# Patient Record
Sex: Male | Born: 1945 | Race: White | Hispanic: No | Smoking: Never smoker
Health system: Southern US, Community
[De-identification: ages and names within clinical notes are randomized; demographics above are authoritative.]

## PROBLEM LIST (undated history)

## (undated) DIAGNOSIS — Z87898 Personal history of other specified conditions: Secondary | ICD-10-CM

## (undated) DIAGNOSIS — M109 Gout, unspecified: Secondary | ICD-10-CM

## (undated) DIAGNOSIS — Z951 Presence of aortocoronary bypass graft: Secondary | ICD-10-CM

## (undated) DIAGNOSIS — K219 Gastro-esophageal reflux disease without esophagitis: Secondary | ICD-10-CM

## (undated) DIAGNOSIS — M549 Dorsalgia, unspecified: Secondary | ICD-10-CM

## (undated) DIAGNOSIS — N281 Cyst of kidney, acquired: Secondary | ICD-10-CM

## (undated) DIAGNOSIS — G8929 Other chronic pain: Secondary | ICD-10-CM

## (undated) DIAGNOSIS — J302 Other seasonal allergic rhinitis: Secondary | ICD-10-CM

## (undated) DIAGNOSIS — N401 Enlarged prostate with lower urinary tract symptoms: Secondary | ICD-10-CM

## (undated) DIAGNOSIS — Z87442 Personal history of urinary calculi: Secondary | ICD-10-CM

## (undated) DIAGNOSIS — I10 Essential (primary) hypertension: Secondary | ICD-10-CM

## (undated) DIAGNOSIS — I447 Left bundle-branch block, unspecified: Secondary | ICD-10-CM

## (undated) DIAGNOSIS — K5790 Diverticulosis of intestine, part unspecified, without perforation or abscess without bleeding: Secondary | ICD-10-CM

## (undated) DIAGNOSIS — R351 Nocturia: Secondary | ICD-10-CM

## (undated) DIAGNOSIS — N183 Chronic kidney disease, stage 3 unspecified: Secondary | ICD-10-CM

## (undated) DIAGNOSIS — T8859XA Other complications of anesthesia, initial encounter: Secondary | ICD-10-CM

## (undated) DIAGNOSIS — R252 Cramp and spasm: Secondary | ICD-10-CM

## (undated) DIAGNOSIS — T4145XA Adverse effect of unspecified anesthetic, initial encounter: Secondary | ICD-10-CM

## (undated) DIAGNOSIS — N200 Calculus of kidney: Secondary | ICD-10-CM

## (undated) DIAGNOSIS — E785 Hyperlipidemia, unspecified: Secondary | ICD-10-CM

## (undated) DIAGNOSIS — I2510 Atherosclerotic heart disease of native coronary artery without angina pectoris: Secondary | ICD-10-CM

## (undated) DIAGNOSIS — R82994 Hypercalciuria: Secondary | ICD-10-CM

## (undated) HISTORY — PX: TONSILLECTOMY: SUR1361

## (undated) HISTORY — PX: ESOPHAGOGASTRODUODENOSCOPY (EGD) WITH ESOPHAGEAL DILATION: SHX5812

## (undated) HISTORY — PX: CARDIAC CATHETERIZATION: SHX172

## (undated) HISTORY — PX: CARDIOVASCULAR STRESS TEST: SHX262

## (undated) HISTORY — PX: FOOT SURGERY: SHX648

## (undated) HISTORY — PX: EXTRACORPOREAL SHOCK WAVE LITHOTRIPSY: SHX1557

## (undated) HISTORY — DX: Atherosclerotic heart disease of native coronary artery without angina pectoris: I25.10

## (undated) HISTORY — PX: COLONOSCOPY: SHX174

---

## 1982-01-13 HISTORY — PX: PYELOLITHOTOMY: SHX2278

## 1994-03-16 HISTORY — PX: URETEROLITHOTOMY: SHX71

## 2002-06-29 ENCOUNTER — Ambulatory Visit (HOSPITAL_BASED_OUTPATIENT_CLINIC_OR_DEPARTMENT_OTHER): Admission: RE | Admit: 2002-06-29 | Discharge: 2002-06-29 | Payer: Self-pay | Admitting: Internal Medicine

## 2010-04-04 ENCOUNTER — Encounter: Admission: RE | Admit: 2010-04-04 | Discharge: 2010-04-04 | Payer: Self-pay | Admitting: Family Medicine

## 2011-09-04 ENCOUNTER — Other Ambulatory Visit: Payer: Self-pay | Admitting: Internal Medicine

## 2011-09-04 DIAGNOSIS — M5416 Radiculopathy, lumbar region: Secondary | ICD-10-CM

## 2011-09-05 ENCOUNTER — Ambulatory Visit
Admission: RE | Admit: 2011-09-05 | Discharge: 2011-09-05 | Disposition: A | Discharge status: Home / Self Care | Payer: Medicare Other | Source: Ambulatory Visit | Attending: Internal Medicine | Admitting: Internal Medicine

## 2011-09-05 DIAGNOSIS — M5416 Radiculopathy, lumbar region: Secondary | ICD-10-CM

## 2012-02-18 ENCOUNTER — Other Ambulatory Visit: Payer: Self-pay | Admitting: Neurological Surgery

## 2012-02-21 ENCOUNTER — Encounter (HOSPITAL_COMMUNITY): Payer: Self-pay | Admitting: Pharmacy Technician

## 2012-02-29 ENCOUNTER — Encounter (HOSPITAL_COMMUNITY)
Admission: RE | Admit: 2012-02-29 | Discharge: 2012-02-29 | Disposition: A | Discharge status: Home / Self Care | Payer: Medicare Other | Source: Ambulatory Visit | Attending: Neurological Surgery | Admitting: Neurological Surgery

## 2012-02-29 ENCOUNTER — Encounter (HOSPITAL_COMMUNITY): Payer: Self-pay

## 2012-02-29 HISTORY — DX: Dorsalgia, unspecified: M54.9

## 2012-02-29 HISTORY — DX: Gastro-esophageal reflux disease without esophagitis: K21.9

## 2012-02-29 HISTORY — DX: Essential (primary) hypertension: I10

## 2012-02-29 HISTORY — DX: Cramp and spasm: R25.2

## 2012-02-29 HISTORY — DX: Hyperlipidemia, unspecified: E78.5

## 2012-02-29 HISTORY — DX: Personal history of urinary calculi: Z87.442

## 2012-02-29 HISTORY — DX: Diverticulosis of intestine, part unspecified, without perforation or abscess without bleeding: K57.90

## 2012-02-29 HISTORY — DX: Other chronic pain: G89.29

## 2012-02-29 LAB — BASIC METABOLIC PANEL
BUN: 21 mg/dL (ref 6–23)
Chloride: 104 mEq/L (ref 96–112)
Creatinine, Ser: 1.49 mg/dL — ABNORMAL HIGH (ref 0.50–1.35)
GFR calc Af Amer: 55 mL/min — ABNORMAL LOW (ref >90)
Glucose, Bld: 122 mg/dL — ABNORMAL HIGH (ref 70–99)
Potassium: 3.9 mEq/L (ref 3.5–5.1)

## 2012-02-29 LAB — SURGICAL PCR SCREEN
MRSA, PCR: NEGATIVE
Staphylococcus aureus: POSITIVE — AB

## 2012-02-29 LAB — CBC
HCT: 45.3 % (ref 39.0–52.0)
Hemoglobin: 15.4 g/dL (ref 13.0–17.0)
MCH: 30 pg (ref 26.0–34.0)
MCHC: 34 g/dL (ref 30.0–36.0)
RDW: 13.9 % (ref 11.5–15.5)

## 2012-02-29 LAB — TYPE AND SCREEN: Antibody Screen: NEGATIVE

## 2012-02-29 LAB — PROTIME-INR: INR: 0.97 (ref 0.00–1.49)

## 2012-02-29 LAB — ABO/RH: ABO/RH(D): A NEG

## 2012-02-29 NOTE — Progress Notes (Signed)
Notified Erie Noe of needing orders to be signed by Dr.Jones.He will return on Mon

## 2012-02-29 NOTE — Pre-Procedure Instructions (Signed)
20 SULEYMAN EHRMAN  02/29/2012   Your procedure is scheduled on:  Fri, Aug 23 @ 7:30 AM  Report to [facility] at 5:30 AM.  Call this number if you have problems the morning of surgery: [phone]   Remember:   Do not eat food:After Midnight.    Take these medicines the morning of surgery with A SIP OF WATER: Allopurinol(Zyloprim),Amlodipine(Norvasc),Loratadine(Claritin),Omeprazole(Prilosec),and Micardis(Telmisartan)   Do not wear jewelry  Do not wear lotions, powders, or colognes  Men may shave face and neck.  Do not bring valuables to the hospital.  Contacts, dentures or bridgework may not be worn into surgery.  Leave suitcase in the car. After surgery it may be brought to your room.  For patients admitted to the hospital, checkout time is 11:00 AM the day of discharge.   Patients discharged the day of surgery will not be allowed to drive home.    Special Instructions: CHG Shower Use Special Wash: 1/2 bottle night before surgery and 1/2 bottle morning of surgery.   Please read over the following fact sheets that you were given: Pain Booklet, Coughing and Deep Breathing, Blood Transfusion Information, MRSA Information and Surgical Site Infection Prevention

## 2012-02-29 NOTE — Progress Notes (Signed)
Echo/Heart cath/Stress test done in 1996 and saw a cardiologist only bc he was in the [facility] and was over 41 and it was required  Medical MD is Dr.Neville Kevan Ny with [facility]  Denies recent cxr but an ekg was done in Jan 2013-to request from [facility]

## 2012-03-07 ENCOUNTER — Encounter (HOSPITAL_COMMUNITY): Payer: Self-pay | Admitting: Anesthesiology

## 2012-03-07 ENCOUNTER — Encounter (HOSPITAL_COMMUNITY)
Admission: RE | Disposition: A | Discharge status: Home / Self Care | Payer: Self-pay | Source: Ambulatory Visit | Attending: Neurological Surgery

## 2012-03-07 ENCOUNTER — Inpatient Hospital Stay (HOSPITAL_COMMUNITY): Payer: Medicare Other

## 2012-03-07 ENCOUNTER — Inpatient Hospital Stay (HOSPITAL_COMMUNITY): Payer: Medicare Other | Admitting: Anesthesiology

## 2012-03-07 ENCOUNTER — Inpatient Hospital Stay (HOSPITAL_COMMUNITY)
Admission: RE | Admit: 2012-03-07 | Discharge: 2012-03-09 | DRG: 460 | Disposition: A | Discharge status: Home / Self Care | Payer: Medicare Other | Source: Ambulatory Visit | Attending: Neurological Surgery | Admitting: Neurological Surgery

## 2012-03-07 ENCOUNTER — Encounter (HOSPITAL_COMMUNITY): Payer: Self-pay | Admitting: *Deleted

## 2012-03-07 ENCOUNTER — Encounter (HOSPITAL_COMMUNITY): Payer: Self-pay | Admitting: Neurological Surgery

## 2012-03-07 DIAGNOSIS — N289 Disorder of kidney and ureter, unspecified: Secondary | ICD-10-CM | POA: Diagnosis present

## 2012-03-07 DIAGNOSIS — M4310 Spondylolisthesis, site unspecified: Principal | ICD-10-CM | POA: Diagnosis present

## 2012-03-07 DIAGNOSIS — K219 Gastro-esophageal reflux disease without esophagitis: Secondary | ICD-10-CM | POA: Diagnosis present

## 2012-03-07 DIAGNOSIS — I10 Essential (primary) hypertension: Secondary | ICD-10-CM | POA: Diagnosis present

## 2012-03-07 HISTORY — PX: POSTERIOR LUMBAR FUSION: SHX6036

## 2012-03-07 SURGERY — POSTERIOR LUMBAR FUSION 1 LEVEL
Anesthesia: General | Site: Back | Laterality: Bilateral | Wound class: Clean

## 2012-03-07 MED ORDER — SODIUM CHLORIDE 0.9 % IV SOLN
10.0000 mg | INTRAVENOUS | Status: DC | PRN
  Administered 2012-03-07: 25 ug/min via INTRAVENOUS

## 2012-03-07 MED ORDER — BACITRACIN 50000 UNITS IM SOLR
INTRAMUSCULAR | Status: AC
  Filled 2012-03-07: qty 1

## 2012-03-07 MED ORDER — PHENOL 1.4 % MT LIQD: 1.0000 | OROMUCOSAL | Status: DC | PRN

## 2012-03-07 MED ORDER — DROPERIDOL 2.5 MG/ML IJ SOLN: 0.6250 mg | INTRAMUSCULAR | Status: DC | PRN

## 2012-03-07 MED ORDER — ALLOPURINOL 100 MG PO TABS
100.0000 mg | ORAL_TABLET | Freq: Every day | ORAL | Status: DC
  Administered 2012-03-08 – 2012-03-09 (×2): 100 mg via ORAL
  Filled 2012-03-07 (×2): qty 1

## 2012-03-07 MED ORDER — CEFAZOLIN SODIUM 1-5 GM-% IV SOLN
1.0000 g | Freq: Three times a day (TID) | INTRAVENOUS | Status: AC
  Administered 2012-03-07 (×2): 1 g via INTRAVENOUS
  Filled 2012-03-07 (×2): qty 50

## 2012-03-07 MED ORDER — MIDAZOLAM HCL 5 MG/5ML IJ SOLN
INTRAMUSCULAR | Status: DC | PRN
  Administered 2012-03-07: 2 mg via INTRAVENOUS

## 2012-03-07 MED ORDER — ACETAMINOPHEN 325 MG PO TABS
650.0000 mg | ORAL_TABLET | ORAL | Status: DC | PRN
  Administered 2012-03-08: 650 mg via ORAL
  Filled 2012-03-07: qty 2

## 2012-03-07 MED ORDER — DEXAMETHASONE 4 MG PO TABS
4.0000 mg | ORAL_TABLET | Freq: Four times a day (QID) | ORAL | Status: DC
  Administered 2012-03-07 – 2012-03-09 (×9): 4 mg via ORAL
  Filled 2012-03-07 (×12): qty 1

## 2012-03-07 MED ORDER — THROMBIN 20000 UNITS EX SOLR
CUTANEOUS | Status: DC | PRN
  Administered 2012-03-07: 08:00:00 via TOPICAL

## 2012-03-07 MED ORDER — 0.9 % SODIUM CHLORIDE (POUR BTL) OPTIME
TOPICAL | Status: DC | PRN
  Administered 2012-03-07: 1000 mL

## 2012-03-07 MED ORDER — HYDROMORPHONE HCL PF 1 MG/ML IJ SOLN
INTRAMUSCULAR | Status: AC
  Filled 2012-03-07: qty 1

## 2012-03-07 MED ORDER — MENTHOL 3 MG MT LOZG: 1.0000 | LOZENGE | OROMUCOSAL | Status: DC | PRN

## 2012-03-07 MED ORDER — ZOLPIDEM TARTRATE 5 MG PO TABS: 5.0000 mg | ORAL_TABLET | Freq: Every evening | ORAL | Status: DC | PRN

## 2012-03-07 MED ORDER — ACETAMINOPHEN 650 MG RE SUPP: 650.0000 mg | RECTAL | Status: DC | PRN

## 2012-03-07 MED ORDER — ACETAMINOPHEN 10 MG/ML IV SOLN
1000.0000 mg | Freq: Four times a day (QID) | INTRAVENOUS | Status: AC
  Administered 2012-03-07 – 2012-03-08 (×3): 1000 mg via INTRAVENOUS
  Filled 2012-03-07 (×5): qty 100

## 2012-03-07 MED ORDER — ONDANSETRON HCL 4 MG/2ML IJ SOLN
INTRAMUSCULAR | Status: DC | PRN
  Administered 2012-03-07: 4 mg via INTRAVENOUS

## 2012-03-07 MED ORDER — LIDOCAINE HCL 4 % MT SOLN
OROMUCOSAL | Status: DC | PRN
  Administered 2012-03-07: 4 mL via TOPICAL

## 2012-03-07 MED ORDER — ONDANSETRON HCL 4 MG/2ML IJ SOLN: 4.0000 mg | INTRAMUSCULAR | Status: DC | PRN

## 2012-03-07 MED ORDER — ATORVASTATIN CALCIUM 10 MG PO TABS
10.0000 mg | ORAL_TABLET | Freq: Every day | ORAL | Status: DC
  Administered 2012-03-07 – 2012-03-08 (×2): 10 mg via ORAL
  Filled 2012-03-07 (×3): qty 1

## 2012-03-07 MED ORDER — SUCCINYLCHOLINE CHLORIDE 20 MG/ML IJ SOLN
INTRAMUSCULAR | Status: DC | PRN
  Administered 2012-03-07: 100 mg via INTRAVENOUS

## 2012-03-07 MED ORDER — LORATADINE 10 MG PO TABS
10.0000 mg | ORAL_TABLET | Freq: Every day | ORAL | Status: DC
  Administered 2012-03-08 – 2012-03-09 (×2): 10 mg via ORAL
  Filled 2012-03-07 (×3): qty 1

## 2012-03-07 MED ORDER — SODIUM CHLORIDE 0.9 % IV SOLN: 250.0000 mL | INTRAVENOUS | Status: DC

## 2012-03-07 MED ORDER — CELECOXIB 200 MG PO CAPS
200.0000 mg | ORAL_CAPSULE | Freq: Two times a day (BID) | ORAL | Status: DC
  Administered 2012-03-07 – 2012-03-09 (×5): 200 mg via ORAL
  Filled 2012-03-07 (×7): qty 1

## 2012-03-07 MED ORDER — PROPOFOL 10 MG/ML IV EMUL
INTRAVENOUS | Status: DC | PRN
  Administered 2012-03-07: 20 mg via INTRAVENOUS
  Administered 2012-03-07: 170 mg via INTRAVENOUS

## 2012-03-07 MED ORDER — AMLODIPINE BESYLATE 5 MG PO TABS
5.0000 mg | ORAL_TABLET | Freq: Every day | ORAL | Status: DC
  Administered 2012-03-08: 5 mg via ORAL
  Filled 2012-03-07 (×4): qty 1

## 2012-03-07 MED ORDER — EPHEDRINE SULFATE 50 MG/ML IJ SOLN
INTRAMUSCULAR | Status: DC | PRN
  Administered 2012-03-07: 10 mg via INTRAVENOUS

## 2012-03-07 MED ORDER — OXYCODONE HCL 5 MG/5ML PO SOLN: 5.0000 mg | Freq: Once | ORAL | Status: DC | PRN

## 2012-03-07 MED ORDER — FENTANYL CITRATE 0.05 MG/ML IJ SOLN
INTRAMUSCULAR | Status: DC | PRN
  Administered 2012-03-07: 150 ug via INTRAVENOUS
  Administered 2012-03-07: 100 ug via INTRAVENOUS

## 2012-03-07 MED ORDER — SODIUM CHLORIDE 0.9 % IJ SOLN
3.0000 mL | Freq: Two times a day (BID) | INTRAMUSCULAR | Status: DC
  Administered 2012-03-08 – 2012-03-09 (×2): 3 mL via INTRAVENOUS

## 2012-03-07 MED ORDER — SODIUM CHLORIDE 0.9 % IR SOLN
Status: DC | PRN
  Administered 2012-03-07: 08:00:00

## 2012-03-07 MED ORDER — OXYCODONE HCL 5 MG PO TABS: 5.0000 mg | ORAL_TABLET | Freq: Once | ORAL | Status: DC | PRN

## 2012-03-07 MED ORDER — HYDROMORPHONE HCL PF 1 MG/ML IJ SOLN
0.2500 mg | INTRAMUSCULAR | Status: DC | PRN
  Administered 2012-03-07 (×2): 0.5 mg via INTRAVENOUS

## 2012-03-07 MED ORDER — IRBESARTAN 75 MG PO TABS
75.0000 mg | ORAL_TABLET | Freq: Every day | ORAL | Status: DC
  Administered 2012-03-08 – 2012-03-09 (×2): 75 mg via ORAL
  Filled 2012-03-07 (×2): qty 1

## 2012-03-07 MED ORDER — ACETAMINOPHEN 10 MG/ML IV SOLN
INTRAVENOUS | Status: AC
  Administered 2012-03-07: 1000 mg via INTRAVENOUS
  Filled 2012-03-07: qty 100

## 2012-03-07 MED ORDER — CYCLOBENZAPRINE HCL 10 MG PO TABS
10.0000 mg | ORAL_TABLET | Freq: Three times a day (TID) | ORAL | Status: DC | PRN
  Administered 2012-03-09: 10 mg via ORAL
  Filled 2012-03-07: qty 1

## 2012-03-07 MED ORDER — PHENYLEPHRINE HCL 10 MG/ML IJ SOLN
INTRAMUSCULAR | Status: DC | PRN
  Administered 2012-03-07: 40 ug via INTRAVENOUS
  Administered 2012-03-07 (×4): 80 ug via INTRAVENOUS
  Administered 2012-03-07: 40 ug via INTRAVENOUS

## 2012-03-07 MED ORDER — CEFAZOLIN SODIUM-DEXTROSE 2-3 GM-% IV SOLR
INTRAVENOUS | Status: DC | PRN
  Administered 2012-03-07: 2 g via INTRAVENOUS

## 2012-03-07 MED ORDER — SENNA 8.6 MG PO TABS
1.0000 | ORAL_TABLET | Freq: Two times a day (BID) | ORAL | Status: DC
  Administered 2012-03-07 – 2012-03-09 (×4): 8.6 mg via ORAL
  Filled 2012-03-07 (×5): qty 1

## 2012-03-07 MED ORDER — ALBUMIN HUMAN 5 % IV SOLN
INTRAVENOUS | Status: DC | PRN
  Administered 2012-03-07: 08:00:00 via INTRAVENOUS

## 2012-03-07 MED ORDER — POTASSIUM CHLORIDE IN NACL 20-0.9 MEQ/L-% IV SOLN
INTRAVENOUS | Status: DC
  Administered 2012-03-07 – 2012-03-08 (×2): via INTRAVENOUS
  Filled 2012-03-07 (×6): qty 1000

## 2012-03-07 MED ORDER — ASPIRIN EC 81 MG PO TBEC
81.0000 mg | DELAYED_RELEASE_TABLET | Freq: Every day | ORAL | Status: DC
  Administered 2012-03-07 – 2012-03-09 (×3): 81 mg via ORAL
  Filled 2012-03-07 (×3): qty 1

## 2012-03-07 MED ORDER — LACTATED RINGERS IV SOLN
INTRAVENOUS | Status: DC | PRN
  Administered 2012-03-07 (×2): via INTRAVENOUS

## 2012-03-07 MED ORDER — LIDOCAINE HCL (CARDIAC) 20 MG/ML IV SOLN
INTRAVENOUS | Status: DC | PRN
  Administered 2012-03-07: 60 mg via INTRAVENOUS

## 2012-03-07 MED ORDER — CEFAZOLIN SODIUM-DEXTROSE 2-3 GM-% IV SOLR
INTRAVENOUS | Status: AC
  Filled 2012-03-07: qty 50

## 2012-03-07 MED ORDER — OXYCODONE-ACETAMINOPHEN 5-325 MG PO TABS: 1.0000 | ORAL_TABLET | ORAL | Status: DC | PRN

## 2012-03-07 MED ORDER — SODIUM CHLORIDE 0.9 % IJ SOLN: 3.0000 mL | INTRAMUSCULAR | Status: DC | PRN

## 2012-03-07 MED ORDER — DEXAMETHASONE SODIUM PHOSPHATE 4 MG/ML IJ SOLN
4.0000 mg | Freq: Four times a day (QID) | INTRAMUSCULAR | Status: DC
  Filled 2012-03-07 (×8): qty 1

## 2012-03-07 MED ORDER — SODIUM CHLORIDE 0.9 % IV SOLN
INTRAVENOUS | Status: AC
  Filled 2012-03-07: qty 500

## 2012-03-07 MED ORDER — POTASSIUM CHLORIDE CRYS ER 20 MEQ PO TBCR
20.0000 meq | EXTENDED_RELEASE_TABLET | Freq: Every day | ORAL | Status: DC
  Administered 2012-03-07 – 2012-03-09 (×3): 20 meq via ORAL
  Filled 2012-03-07 (×3): qty 1

## 2012-03-07 MED ORDER — PANTOPRAZOLE SODIUM 40 MG PO TBEC
40.0000 mg | DELAYED_RELEASE_TABLET | Freq: Every day | ORAL | Status: DC
  Administered 2012-03-08 – 2012-03-09 (×2): 40 mg via ORAL

## 2012-03-07 MED ORDER — BUPIVACAINE HCL (PF) 0.25 % IJ SOLN
INTRAMUSCULAR | Status: DC | PRN
  Administered 2012-03-07: 5 mL

## 2012-03-07 MED ORDER — MORPHINE SULFATE 2 MG/ML IJ SOLN
1.0000 mg | INTRAMUSCULAR | Status: DC | PRN
  Administered 2012-03-07 – 2012-03-08 (×3): 2 mg via INTRAVENOUS
  Filled 2012-03-07 (×3): qty 1

## 2012-03-07 SURGICAL SUPPLY — 63 items
11x28x9mm CoRoent MP ×2 IMPLANT
5.5 Tulip Screw ×8 IMPLANT
5.5x40mm Rod ×4 IMPLANT
BAG DECANTER FOR FLEXI CONT (MISCELLANEOUS) ×2 IMPLANT
BENZOIN TINCTURE PRP APPL 2/3 (GAUZE/BANDAGES/DRESSINGS) ×2 IMPLANT
BLADE SURG ROTATE 9660 (MISCELLANEOUS) IMPLANT
BONE MATRIX OSTEOCEL PLUS 5CC (Bone Implant) ×2 IMPLANT
BUR MATCHSTICK NEURO 3.0 LAGG (BURR) ×2 IMPLANT
CANISTER SUCTION 2500CC (MISCELLANEOUS) ×2 IMPLANT
CLIP NEUROVISION LG (CLIP) ×2 IMPLANT
CLOTH BEACON ORANGE TIMEOUT ST (SAFETY) ×2 IMPLANT
CONT SPEC 4OZ CLIKSEAL STRL BL (MISCELLANEOUS) ×4 IMPLANT
COVER BACK TABLE 24X17X13 BIG (DRAPES) IMPLANT
COVER TABLE BACK 60X90 (DRAPES) ×2 IMPLANT
DERMABOND ADVANCED (GAUZE/BANDAGES/DRESSINGS) ×1
DERMABOND ADVANCED .7 DNX12 (GAUZE/BANDAGES/DRESSINGS) ×1 IMPLANT
DRAPE C-ARM 42X72 X-RAY (DRAPES) ×4 IMPLANT
DRAPE C-ARMOR (DRAPES) ×2 IMPLANT
DRAPE LAPAROTOMY 100X72X124 (DRAPES) ×2 IMPLANT
DRAPE POUCH INSTRU U-SHP 10X18 (DRAPES) ×2 IMPLANT
DRAPE SURG 17X23 STRL (DRAPES) ×2 IMPLANT
DRESSING TELFA 8X3 (GAUZE/BANDAGES/DRESSINGS) ×2 IMPLANT
DRSG OPSITE 4X5.5 SM (GAUZE/BANDAGES/DRESSINGS) ×2 IMPLANT
DURAPREP 26ML APPLICATOR (WOUND CARE) ×2 IMPLANT
ELECT REM PT RETURN 9FT ADLT (ELECTROSURGICAL) ×2
ELECTRODE REM PT RTRN 9FT ADLT (ELECTROSURGICAL) ×1 IMPLANT
EVACUATOR 1/8 PVC DRAIN (DRAIN) ×2 IMPLANT
GAUZE SPONGE 4X4 16PLY XRAY LF (GAUZE/BANDAGES/DRESSINGS) ×2 IMPLANT
GLOVE BIO SURGEON STRL SZ8 (GLOVE) ×4 IMPLANT
GLOVE BIOGEL PI IND STRL 7.0 (GLOVE) ×3 IMPLANT
GLOVE BIOGEL PI INDICATOR 7.0 (GLOVE) ×3
GLOVE SS BIOGEL STRL SZ 6.5 (GLOVE) ×2 IMPLANT
GLOVE SUPERSENSE BIOGEL SZ 6.5 (GLOVE) ×2
GLOVE SURG SS PI 7.0 STRL IVOR (GLOVE) ×2 IMPLANT
GOWN BRE IMP SLV AUR LG STRL (GOWN DISPOSABLE) ×2 IMPLANT
GOWN BRE IMP SLV AUR XL STRL (GOWN DISPOSABLE) ×6 IMPLANT
GOWN STRL REIN 2XL LVL4 (GOWN DISPOSABLE) IMPLANT
HEMOSTAT POWDER KIT SURGIFOAM (HEMOSTASIS) IMPLANT
KIT BASIN OR (CUSTOM PROCEDURE TRAY) ×2 IMPLANT
KIT NEEDLE NVM5 EMG ELECT (KITS) ×1 IMPLANT
KIT NEEDLE NVM5 EMG ELECTRODE (KITS) ×1
KIT ROOM TURNOVER OR (KITS) ×2 IMPLANT
LIGHT SOURCE ANGLE TIP STR 7FT (MISCELLANEOUS) ×2 IMPLANT
Lock Screw ×2 IMPLANT
MILL MEDIUM DISP (BLADE) IMPLANT
NEEDLE HYPO 25X1 1.5 SAFETY (NEEDLE) ×2 IMPLANT
NS IRRIG 1000ML POUR BTL (IV SOLUTION) ×2 IMPLANT
PACK LAMINECTOMY NEURO (CUSTOM PROCEDURE TRAY) ×2 IMPLANT
PAD ARMBOARD 7.5X6 YLW CONV (MISCELLANEOUS) ×6 IMPLANT
SCREW SHANK MASTLIS 5.5X35 (Screw) ×8 IMPLANT
SPONGE LAP 4X18 X RAY DECT (DISPOSABLE) IMPLANT
SPONGE SURGIFOAM ABS GEL 100 (HEMOSTASIS) ×2 IMPLANT
STRIP CLOSURE SKIN 1/2X4 (GAUZE/BANDAGES/DRESSINGS) ×2 IMPLANT
SUT VIC AB 0 CT1 18XCR BRD8 (SUTURE) ×1 IMPLANT
SUT VIC AB 0 CT1 8-18 (SUTURE) ×1
SUT VIC AB 2-0 CP2 18 (SUTURE) ×2 IMPLANT
SUT VIC AB 3-0 SH 8-18 (SUTURE) ×4 IMPLANT
SYR 20ML ECCENTRIC (SYRINGE) ×2 IMPLANT
TOWEL OR 17X24 6PK STRL BLUE (TOWEL DISPOSABLE) ×2 IMPLANT
TOWEL OR 17X26 10 PK STRL BLUE (TOWEL DISPOSABLE) ×2 IMPLANT
TRAP SPECIMEN MUCOUS 40CC (MISCELLANEOUS) IMPLANT
TRAY FOLEY CATH 14FRSI W/METER (CATHETERS) ×2 IMPLANT
WATER STERILE IRR 1000ML POUR (IV SOLUTION) ×2 IMPLANT

## 2012-03-07 NOTE — Progress Notes (Signed)
UR COMPLETED  

## 2012-03-07 NOTE — H&P (Signed)
Subjective: Patient is a 66 y.o. male admitted for PLIF L4-5. Onset of symptoms was several year ago, gradually worsening since that time.  The pain is rated severe, and is located at the across the lower back and radiates to RLE. The pain is described as aching and sharp and occurs all day. The symptoms have been progressive. Symptoms are exacerbated by exercise. MRI or CT showed stenosis/ spondylolisthesis L4-5   Past Medical History  Diagnosis Date  . History of kidney stones   . Hypertension     takes Norvasc and Micardis daily  . Hyperlipidemia     takes Crestor daily  . Pneumonia     history of double 30+yrs ago  . History of seasonal allergies     takes Loratadine daily  . Joint pain   . Chronic back pain     herniated disc  . GERD (gastroesophageal reflux disease)     takes Omeprazole daily  . Diverticulosis   . Nocturia   . Muscle cramps     takes KDUr and Mag Ox as a trial to see if it will help  . History of gout     takes Allopurinol daily    Past Surgical History  Procedure Date  . Cystoscopy 1993  . Foot surgery 47yrs ago    right-removed bone spur   . Colonoscopy   . Esophagogastroduodenoscopy     with diliation  . Lithotripsy     x 2  . Vasectomy 46yrs ago    Prior to Admission medications   Medication Sig Start Date End Date Taking? Authorizing Provider  allopurinol (ZYLOPRIM) 100 MG tablet Take 100 mg by mouth daily.   Yes Historical Provider, MD  amLODipine (NORVASC) 5 MG tablet Take 5 mg by mouth daily.   Yes Historical Provider, MD  aspirin EC 81 MG tablet Take 81 mg by mouth daily.   Yes Historical Provider, MD  loratadine (CLARITIN) 10 MG tablet Take 10 mg by mouth daily.   Yes Historical Provider, MD  magnesium oxide (MAG-OX) 400 MG tablet Take 400 mg by mouth daily.   Yes Historical Provider, MD  Multiple Vitamin (MULTIVITAMIN WITH MINERALS) TABS Take 1 tablet by mouth daily.   Yes Historical Provider, MD  Omega-3 Fatty Acids (FISH OIL) 1200 MG  CAPS Take 1 capsule by mouth daily.   Yes Historical Provider, MD  omeprazole (PRILOSEC) 20 MG capsule Take 20 mg by mouth daily.   Yes Historical Provider, MD  potassium chloride SA (K-DUR,KLOR-CON) 20 MEQ tablet Take 20 mEq by mouth daily.   Yes Historical Provider, MD  rosuvastatin (CRESTOR) 5 MG tablet Take 5 mg by mouth at bedtime.   Yes Historical Provider, MD  telmisartan (MICARDIS) 80 MG tablet Take 80 mg by mouth every morning.   Yes Historical Provider, MD   No Known Allergies  History  Substance Use Topics  . Smoking status: Never Smoker   . Smokeless tobacco: Not on file  . Alcohol Use: No    History reviewed. No pertinent family history.   Review of Systems  Positive ROS: neg  All other systems have been reviewed and were otherwise negative with the exception of those mentioned in the HPI and as above.  Objective: Vital signs in last 24 hours: Temp:  [97.6 F (36.4 C)] 97.6 F (36.4 C) (08/23 0540) Pulse Rate:  [97] 97  (08/23 0540) Resp:  [18] 18  (08/23 0540) BP: (123-148)/(86-101) 123/86 mmHg (08/23 0612) SpO2:  [97 %] 97 % (  08/23 0540)  General Appearance: Alert, cooperative, no distress, appears stated age Head: Normocephalic, without obvious abnormality, atraumatic Eyes: PERRL, conjunctiva/corneas clear, EOM's intact, fundi benign, both eyes      Ears: Normal TM's and external ear canals, both ears Throat: Lips, mucosa, and tongue normal; teeth and gums normal Neck: Supple, symmetrical, trachea midline, no adenopathy; thyroid: No enlargement/tenderness/nodules; no carotid bruit or JVD Back: Symmetric, no curvature, ROM normal, no CVA tenderness Lungs: Clear to auscultation bilaterally, respirations unlabored Heart: Regular rate and rhythm, S1 and S2 normal, no murmur, rub or gallop Abdomen: Soft, non-tender, bowel sounds active all four quadrants, no masses, no organomegaly Extremities: Extremities normal, atraumatic, no cyanosis or edema Pulses: 2+ and  symmetric all extremities Skin: Skin color, texture, turgor normal, no rashes or lesions  NEUROLOGIC:   Mental status: Alert and oriented x4,  no aphasia, good attention span, fund of knowledge, and memory Motor Exam - grossly normal Sensory Exam - grossly normal Reflexes: 1+ Coordination - grossly normal Gait - grossly normal Balance - grossly normal Cranial Nerves: I: smell Not tested  II: visual acuity  OS: nl    OD: nl  II: visual fields Full to confrontation  II: pupils Equal, round, reactive to light  III,VII: ptosis None  III,IV,VI: extraocular muscles  Full ROM  V: mastication Normal  V: facial light touch sensation  Normal  V,VII: corneal reflex  Present  VII: facial muscle function - upper  Normal  VII: facial muscle function - lower Normal  VIII: hearing Not tested  IX: soft palate elevation  Normal  IX,X: gag reflex Present  XI: trapezius strength  5/5  XI: sternocleidomastoid strength 5/5  XI: neck flexion strength  5/5  XII: tongue strength  Normal    Data Review Lab Results  Component Value Date   WBC 8.9 02/29/2012   HGB 15.4 02/29/2012   HCT 45.3 02/29/2012   MCV 88.1 02/29/2012   PLT 158 02/29/2012   Lab Results  Component Value Date   NA 139 02/29/2012   K 3.9 02/29/2012   CL 104 02/29/2012   CO2 24 02/29/2012   BUN 21 02/29/2012   CREATININE 1.49* 02/29/2012   GLUCOSE 122* 02/29/2012   Lab Results  Component Value Date   INR 0.97 02/29/2012    Assessment/Plan: Patient admitted for PLIF L4-5. Patient has failed conservative therapy.  I explained the condition and procedure to the patient and answered any questions.  Patient wishes to proceed with procedure as planned. Understands risks/ benefits and typical outcomes of procedure.   JONES,DAVID S 03/07/2012 7:45 AM

## 2012-03-07 NOTE — Anesthesia Preprocedure Evaluation (Signed)
Anesthesia Evaluation  Patient identified by MRN, date of birth, ID band Patient awake    Reviewed: Allergy & Precautions, H&P , NPO status , Patient's Chart, lab work & pertinent test results  Airway Mallampati: II TM Distance: >3 FB Neck ROM: Full    Dental  (+) Teeth Intact and Dental Advisory Given   Pulmonary pneumonia -, resolved,  breath sounds clear to auscultation  Pulmonary exam normal       Cardiovascular hypertension, Pt. on medications Rhythm:Regular Rate:Normal     Neuro/Psych    GI/Hepatic Neg liver ROS, GERD-  Medicated,  Endo/Other  negative endocrine ROS  Renal/GU Renal InsufficiencyRenal disease     Musculoskeletal   Abdominal   Peds  Hematology   Anesthesia Other Findings   Reproductive/Obstetrics                           Anesthesia Physical Anesthesia Plan  ASA: II  Anesthesia Plan: General   Post-op Pain Management:    Induction: Intravenous  Airway Management Planned: Oral ETT  Additional Equipment:   Intra-op Plan:   Post-operative Plan: Extubation in OR  Informed Consent: I have reviewed the patients History and Physical, chart, labs and discussed the procedure including the risks, benefits and alternatives for the proposed anesthesia with the patient or authorized representative who has indicated his/her understanding and acceptance.   Dental advisory given  Plan Discussed with: CRNA, Anesthesiologist and Surgeon  Anesthesia Plan Comments:         Anesthesia Quick Evaluation

## 2012-03-07 NOTE — Anesthesia Postprocedure Evaluation (Signed)
Anesthesia Post Note  Patient: [name] [name]  Procedure(s) Performed: Procedure(s) (LRB): POSTERIOR LUMBAR FUSION 1 LEVEL (Bilateral)  Anesthesia type: general  Patient location: PACU  Post pain: Pain level controlled  Post assessment: Patient's Cardiovascular Status Stable  Last Vitals:  Filed Vitals:   03/07/12 1240  BP:   Pulse: 81  Temp: 36.1 C  Resp: 10    Post vital signs: Reviewed and stable  Level of consciousness: sedated  Complications: No apparent anesthesia complications

## 2012-03-07 NOTE — Preoperative (Signed)
Beta Blockers   Reason not to administer Beta Blockers:Not Applicable 

## 2012-03-07 NOTE — Op Note (Signed)
03/07/2012  11:29 AM  PATIENT:  [name] [name]  66 y.o. 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 Decompressive lumbar laminectomy L4-5 requiring more work than would be required of the typical PLIF procedure in order to adequately decompress the neural elements.  2. Posterior lumbar interbody fusion L4-5 using a PEEK interbody cages packed with morcellized allograft and autograft 3. Posterior fixation L4-5 using Nuvasive screws.    SURGEON:  Marikay Alar, MD  ASSISTANTS: None  ANESTHESIA:  General  EBL: 150 ml  Total I/O In: 1750 [I.V.:1500; IV Piggyback:250] Out: 265 [Urine:115; Blood:150]  BLOOD ADMINISTERED:none  DRAINS: Hemovac   INDICATION FOR PROCEDURE: This is a patient presented with severe back and right leg pain. MRI showed a grade 1 spondylolisthesis at L4-5 with spinal stenosis. He tried medical management without relief. Recommended a PLIF at L4-5. Patient understood the risks, benefits, and alternatives and potential outcomes and wished to proceed.  PROCEDURE DETAILS:  The patient was brought to the operating room. After induction of generalized endotracheal anesthesia the patient was rolled into the prone position on chest rolls and all pressure points were padded. He was prepped for EMG monitoring was used through the entirety of the case. The patient's lumbar region was cleaned and then prepped with DuraPrep and draped in the usual sterile fashion. Anesthesia was injected and then a dorsal midline incision was made and carried down to the lumbosacral fascia. The fascia was opened and the paraspinous musculature was taken down in a subperiosteal fashion to expose L4-5. Intraoperative fluoroscopy confirmed my level, and then the MASPLIF retractor was placed to expose L4-5. Utilizing AP and lateral fluoroscopy the entry point for the cortical screw at L4 was  identified 3 mm medial to the lateral edge of the pars. I then drilled and tapped in an upward and outward direction utilizing AP and lateral fluoroscopy and then placed a 35 mm cortical pedicle screw bilaterally at L4. Then turned my attention to the decompression and the spinous process was removed and complete lumbar laminectomies, hemi- facetectomies, and foraminotomies were performed at L4-5. The yellow ligament was removed to expose the underlying dura and nerve roots, and generous foraminotomies were performed to adequately decompress the neural elements that is his spinal stenosis. This required more decompressive work than would be typical of a simple PLIF exposure. Once the decompression was complete, I turned my attention to the posterior lower lumbar interbody fusion. The epidural venous vasculature was coagulated and cut sharply. Disc space was incised and the initial discectomy was performed with pituitary rongeurs. The disc space was distracted with sequential distractors to a height of 11 mm. We then used a series of scrapers and shavers to prepare the endplates for fusion. The midline was prepared with Epstein curettes. Once the complete discectomy was finished, we packed an appropriate sized peek interbody cage with local autograft and morcellized allograft, gently retracted the nerve root, and tapped the cage into position at L4-5 on the right. We also tapped a cage into the opposite side. The midline was packed with morselized autograft and allograft. We then turned our attention to the posterior fixation at L5. The pedicle screw entry zones were identified utilizing surface landmarks and AP and lateral fluoroscopy. Drilled in a lateral projection along the endplates of L5 and tapped each pedicle with the appropriate tap. We palpated with a ball probe to assure no  break in the cortex. We then placed 35 mm cortical screw  into the pedicles bilaterally at  L5 .  We then placed lordotic rods into  the multiaxial screw heads of the pedicle screws and locked these in position with the locking caps and anti-torque device. We then checked our construct with AP and lateral fluoroscopy. Irrigated with copious amounts of bacitracin-containing saline solution. Placed a medium Hemovac drain through separate stab incision. Inspected the nerve roots once again to assure adequate decompression, lined to the dura with Gelfoam, and closed the muscle and the fascia with 0 Vicryl. Closed the subcutaneous tissues with 2-0 Vicryl and subcuticular tissues with 3-0 Vicryl. The skin was closed with benzoin and Steri-Strips. Dressing was then applied, the patient was awakened from general anesthesia and transported to the recovery room in stable condition. At the end of the procedure all sponge, needle and instrument counts were correct.   PLAN OF CARE: Admit to inpatient   PATIENT DISPOSITION:  PACU - hemodynamically stable.   Delay start of Pharmacological VTE agent (>24hrs) due to surgical blood loss or risk of bleeding:  yes

## 2012-03-07 NOTE — Transfer of Care (Signed)
Immediate Anesthesia Transfer of Care Note  Patient: [name] [name]  Procedure(s) Performed: Procedure(s) (LRB): POSTERIOR LUMBAR FUSION 1 LEVEL (Bilateral)  Patient Location: PACU  Anesthesia Type: General  Level of Consciousness: awake, alert  and oriented  Airway & Oxygen Therapy: Patient Spontanous Breathing and Patient connected to nasal cannula oxygen  Post-op Assessment: Report given to PACU RN, Post -op Vital signs reviewed and stable and Patient moving all extremities X 4  Post vital signs: Reviewed and stable  Complications: No apparent anesthesia complications

## 2012-03-08 NOTE — Progress Notes (Signed)
Subjective: Patient reports Fusion great his legs feel somewhat better back pain is well controlled  Objective: Vital signs in last 24 hours: Temp:  [96.9 F (36.1 C)-98.3 F (36.8 C)] 97.9 F (36.6 C) (08/24 0601) Pulse Rate:  [77-91] 85  (08/24 0601) Resp:  [10-19] 18  (08/24 0601) BP: (103-136)/(66-84) 132/78 mmHg (08/24 0601) SpO2:  [92 %-96 %] 93 % (08/24 0601) Weight:  [100.5 kg (221 lb 9 oz)] 100.5 kg (221 lb 9 oz) (08/23 1300)  Intake/Output from previous day: 08/23 0701 - 08/24 0700 In: 1950 [I.V.:1500; IV Piggyback:450] Out: 1940 [Urine:1590; Drains:200; Blood:150] Intake/Output this shift:    Strength is 5 out of 5 wound is clean and dry  Lab Results: No results found for this basename: WBC:2,HGB:2,HCT:2,PLT:2 in the last 72 hours BMET No results found for this basename: NA:2,K:2,CL:2,CO2:2,GLUCOSE:2,BUN:2,CREATININE:2,CALCIUM:2 in the last 72 hours  Studies/Results: Dg Lumbar Spine 2-3 Views  03/07/2012  *RADIOLOGY REPORT*  Clinical Data: L4-L5 PLIF.  LUMBAR SPINE - 2-3 VIEW  Comparison: Lumbar spine radiographs 10/29/2011 from [facility].  MRI 09/05/2011.  Findings: Two spot fluoroscopic images of the lower lumbar spine demonstrate interval laminectomy and PLIF at L4-L5.  Pedicle screws, interconnecting rods and interbody spacers appear well positioned.  No complications are identified.  IMPRESSION: Intraoperative views following L4-L5 PLIF.  No demonstrated complication.   Original Report Authenticated By: Gerrianne Scale, M.D.     Assessment/Plan: Aggressive mobilization today probable discharge tomorrow  LOS: 1 day     Jazmaine Fuelling P 03/08/2012, 8:40 AM

## 2012-03-08 NOTE — Progress Notes (Signed)
Occupational Therapy Evaluation Patient Details Name: [name] [name] MRN: [id number] DOB: 1946-05-06 Today's Date: 03/08/2012 Time: 8295-6213 OT Time Calculation (min): 22 min  OT Assessment / Plan / Recommendation Clinical Impression  Pt s/p PLF L4-5.  Pt able to perform ADLs and functional mobility at mod I level while maintaining back precautions.  No further acute OT needs.    OT Assessment  Patient does not need any further OT services    Follow Up Recommendations  No OT follow up;Supervision - Intermittent    Barriers to Discharge      Equipment Recommendations  None recommended by PT;None recommended by OT    Recommendations for Other Services    Frequency       Precautions / Restrictions Precautions Precautions: Back Precaution Booklet Issued: Yes (comment) Precaution Comments: pt educated on 3/3 back precautions Required Braces or Orthoses: Spinal Brace Spinal Brace: Lumbar corset;Applied in sitting position Restrictions Weight Bearing Restrictions: No   Pertinent Vitals/Pain See vitals    ADL  Lower Body Dressing: Performed;Modified independent Where Assessed - Lower Body Dressing: Unsupported sitting Toilet Transfer: Performed;Modified independent Toilet Transfer Method: Sit to Barista:  (chair) Tub/Shower Transfer: Performed;Modified independent Tub/Shower Transfer Method: Ambulating Equipment Used: Back brace;Gait belt Transfers/Ambulation Related to ADLs: mod I for increased time ADL Comments: Educated pt on ADL techniques and correct positioning while maintaining back precautions.  Pt verbalized and demonstrated understanding.      OT Diagnosis:    OT Problem List:   OT Treatment Interventions:     OT Goals    Visit Information  Last OT Received On: 03/08/12 PT/OT Co-Evaluation/Treatment: Yes    Subjective Data      Prior Functioning  Vision/Perception  Home Living Lives With: Spouse Available Help at Discharge:  Family;Available 24 hours/day Type of Home: House Home Access: Stairs to enter [employer] of Steps: 2 Entrance Stairs-Rails: None Home Layout: Two level Alternate Level Stairs-Number of Steps: 13 Alternate Level Stairs-Rails: Left Bathroom Shower/Tub: Tub/shower unit;Curtain Bathroom Toilet: Standard Bathroom Accessibility: Yes How Accessible: Accessible via walker Home Adaptive Equipment: None Prior Function Level of Independence: Independent Able to Take Stairs?: Yes Driving: Yes Vocation: Retired Musician: No difficulties Dominant Hand: Right      Cognition  Overall Cognitive Status: Appears within functional limits for tasks assessed/performed Arousal/Alertness: Awake/alert Orientation Level: Appears intact for tasks assessed Behavior During Session: [facility] for tasks performed    Extremity/Trunk Assessment Right Upper Extremity Assessment RUE ROM/Strength/Tone: Within functional levels Left Upper Extremity Assessment LUE ROM/Strength/Tone: Within functional levels Right Lower Extremity Assessment RLE ROM/Strength/Tone: Within functional levels RLE Sensation: WFL - Light Touch Left Lower Extremity Assessment LLE ROM/Strength/Tone: Within functional levels LLE Sensation: WFL - Light Touch   Mobility Bed Mobility Bed Mobility: Not assessed Transfers Sit to Stand: 6: Modified independent (Device/Increase time);With upper extremity assist;From chair/3-in-1 Stand to Sit: 6: Modified independent (Device/Increase time);With upper extremity assist;To chair/3-in-1   Exercise    Balance    End of Session OT - End of Session Equipment Utilized During Treatment: Gait belt;Back brace Activity Tolerance: Patient tolerated treatment well Patient left: in chair;with call bell/phone within reach Nurse Communication: Mobility status  GO    03/08/2012 Cipriano Mile OTR/L Pager [phone] Office [phone]  Cipriano Mile 03/08/2012,  12:34 PM

## 2012-03-08 NOTE — Progress Notes (Signed)
Nutrition Brief Note  Patient identified on the Malnutrition Screening Tool (MST) report for unsure of weight loss, generating a score of 2.   Body mass index is 35.76 kg/(m^2). Pt meets criteria for obesity based on current BMI.   Wt Readings from Last 10 Encounters:  03/07/12 221 lb 9 oz (100.5 kg)  03/07/12 221 lb 9 oz (100.5 kg)  02/29/12 213 lb 3 oz (96.7 kg)   No recent weight loss identified  Current diet order is regular, ordered today at 1320. Labs and medications reviewed.   Pt may be discharged 8/25  No nutrition interventions warranted at this time. If nutrition issues arise, please consult RD.   Elisabeth Cara M.Odis Luster LDN Neonatal Nutrition Support Specialist Pager [phone]

## 2012-03-08 NOTE — Evaluation (Signed)
Physical Therapy Evaluation Patient Details Name: [name] [name] MRN: [id number] DOB: January 05, 1946 Today's Date: 03/08/2012 Time: 4098-1191 PT Time Calculation (min): 22 min  PT Assessment / Plan / Recommendation Clinical Impression  Pt s/p PLF L4-5. Pt is at his baseline functional level, requiring no physical assist for any mobility.Pt is safe to d/c home with assistance as needed. No further acute PT needs, will now follow    PT Assessment  Patent does not need any further PT services    Follow Up Recommendations  No PT follow up;Supervision - Intermittent    Barriers to Discharge        Equipment Recommendations  None recommended by PT    Recommendations for Other Services     Frequency      Precautions / Restrictions Precautions Precautions: Back Precaution Booklet Issued: Yes (comment) Precaution Comments: pt educated on 3/3 back precautions Required Braces or Orthoses: Spinal Brace Spinal Brace: Lumbar corset;Applied in sitting position Restrictions Weight Bearing Restrictions: No   Pertinent Vitals/Pain Pt with minimal complaints of pain      Mobility  Bed Mobility Bed Mobility: Not assessed Transfers Transfers: Sit to Stand;Stand to Sit Sit to Stand: 6: Modified independent (Device/Increase time);With upper extremity assist;From chair/3-in-1 Stand to Sit: 6: Modified independent (Device/Increase time);With upper extremity assist;To chair/3-in-1 Ambulation/Gait Ambulation/Gait Assistance: 6: Modified independent (Device/Increase time) Stairs: Yes Stairs Assistance: 6: Modified independent (Device/Increase time) Stair Management Technique: No rails;Step to pattern;Forwards Number of Stairs: 3       Visit Information  Last PT Received On: 03/08/12 Assistance Needed: +1 PT/OT Co-Evaluation/Treatment: Yes    Subjective Data  Patient Stated Goal: to get back to golf in the long run   Prior Functioning  Home Living Lives With: Spouse Available Help at  Discharge: Family;Available 24 hours/day Type of Home: House Home Access: Stairs to enter [employer] of Steps: 2 Entrance Stairs-Rails: None Home Layout: Two level Alternate Level Stairs-Number of Steps: 13 Alternate Level Stairs-Rails: Left Bathroom Shower/Tub: Tub/shower unit;Curtain Bathroom Toilet: Standard Bathroom Accessibility: Yes How Accessible: Accessible via walker Home Adaptive Equipment: None Prior Function Level of Independence: Independent Able to Take Stairs?: Yes Driving: Yes Vocation: Retired Musician: No difficulties Dominant Hand: Right    Cognition  Overall Cognitive Status: Appears within functional limits for tasks assessed/performed Arousal/Alertness: Awake/alert Orientation Level: Appears intact for tasks assessed Behavior During Session: [facility] for tasks performed    Extremity/Trunk Assessment Right Lower Extremity Assessment RLE ROM/Strength/Tone: Within functional levels RLE Sensation: WFL - Light Touch Left Lower Extremity Assessment LLE ROM/Strength/Tone: Within functional levels LLE Sensation: WFL - Light Touch   Balance    End of Session PT - End of Session Equipment Utilized During Treatment: Back brace;Gait belt Activity Tolerance: Patient tolerated treatment well Patient left: in chair;with call bell/phone within reach Nurse Communication: Mobility status  GP     Milana Kidney 03/08/2012, 9:44 AM  03/08/2012 Milana Kidney DPT PAGER: [phone] OFFICE: [phone]

## 2012-03-09 MED ORDER — CYCLOBENZAPRINE HCL 10 MG PO TABS: 10.0000 mg | ORAL_TABLET | Freq: Three times a day (TID) | ORAL | Status: AC | PRN

## 2012-03-09 NOTE — Progress Notes (Signed)
Patient has not needed a walker with ambulation, or any other equipment while in the hospital. Patient denies needing any equipment for whenever he is discharged.  Minor, Yvette Rack

## 2012-03-09 NOTE — Discharge Summary (Signed)
Physician Discharge Summary  Patient ID: [name] [name] MRN: [id number] DOB/AGE: 12/25/1945 66 y.o.  Admit date: 03/07/2012 Discharge date: 03/09/2012  Admission Diagnoses:lumbar stenosis, L4/5   Discharge Diagnoses: Lumbar stenosis L4/5 Active Problems:  * No active hospital problems. *    Discharged Condition: good  Hospital Course: Mr. Alicea was admitted to the hospital and taken to the operating room where he underwent a lumbar arthrodesis, and decompression at L4/5, with pedicle screw-Nuvasive instrumentation. Post op he has done very well. At discharge he is ambulating well, voiding, and tolerating a regular diet. He will be discharged home today.   Consults: None  Significant Diagnostic Studies: none  Treatments: surgery: 1. Decompressive lumbar laminectomy L4-5 requiring more work than would be required of the typical PLIF procedure in order to adequately decompress the neural elements.  2. Posterior lumbar interbody fusion L4-5 using a PEEK interbody cages packed with morcellized allograft and autograft  3. Posterior fixation L4-5 using Nuvasive screws.    Discharge Exam: Blood pressure 128/81, pulse 94, temperature 97.4 F (36.3 C), temperature source Oral, resp. rate 18, height 5\' 6"  (1.676 m), weight 100.5 kg (221 lb 9 oz), SpO2 94.00%. General appearance: alert, cooperative and appears stated age Neurologic: Alert and oriented X 3, normal strength and tone. Normal symmetric reflexes. Normal coordination and gait  Disposition: Final discharge disposition not confirmed   Medication List  As of 03/09/2012  1:43 PM   TAKE these medications         allopurinol 100 MG tablet   Commonly known as: ZYLOPRIM   Take 100 mg by mouth daily.      amLODipine 5 MG tablet   Commonly known as: NORVASC   Take 5 mg by mouth daily.      aspirin EC 81 MG tablet   Take 81 mg by mouth daily.      cyclobenzaprine 10 MG tablet   Commonly known as: FLEXERIL   Take 1 tablet (10 mg  total) by mouth 3 (three) times daily as needed for muscle spasms.      Fish Oil 1200 MG Caps   Take 1 capsule by mouth daily.      loratadine 10 MG tablet   Commonly known as: CLARITIN   Take 10 mg by mouth daily.      magnesium oxide 400 MG tablet   Commonly known as: MAG-OX   Take 400 mg by mouth daily.      MICARDIS 80 MG tablet   Generic drug: telmisartan   Take 80 mg by mouth every morning.      multivitamin with minerals Tabs   Take 1 tablet by mouth daily.      omeprazole 20 MG capsule   Commonly known as: PRILOSEC   Take 20 mg by mouth daily.      potassium chloride SA 20 MEQ tablet   Commonly known as: K-DUR,KLOR-CON   Take 20 mEq by mouth daily.      rosuvastatin 5 MG tablet   Commonly known as: CRESTOR   Take 5 mg by mouth at bedtime.           Follow-up Information    Follow up with JONES,DAVID S, MD in 2 weeks. (call to make appt)    Contact information:   [address] [place] [address] [phone]          Signed: Carmela Hurt 03/09/2012, 1:43 PM

## 2012-03-11 MED FILL — Sodium Chloride IV Soln 0.9%: INTRAVENOUS | Qty: 1000 | Status: AC

## 2012-03-11 MED FILL — Sodium Chloride Irrigation Soln 0.9%: Qty: 3000 | Status: AC

## 2012-03-11 MED FILL — Heparin Sodium (Porcine) Inj 1000 Unit/ML: INTRAMUSCULAR | Qty: 30 | Status: AC

## 2012-03-13 ENCOUNTER — Encounter (HOSPITAL_COMMUNITY): Payer: Self-pay

## 2012-05-23 ENCOUNTER — Encounter (HOSPITAL_COMMUNITY): Payer: Self-pay | Admitting: *Deleted

## 2012-05-23 ENCOUNTER — Other Ambulatory Visit: Payer: Self-pay | Admitting: Urology

## 2012-05-23 NOTE — Pre-Procedure Instructions (Signed)
Asked to bring blue folder the day of the procedure,insurance card,I.D. driver's license,wear comfortable clothing and have a driver for the day. Asked not to take Advil,Motrin,Ibuprofen,Aleve or any NSAIDS, Aspirin, or Toradol for 72 hours prior to procedure,  No vitamins or herbal medications 7 days prior to procedure. Instructed to take laxative per doctor's office instructions and eat a light dinner the evening before procedure.   To arrive at 1530 for lithotripsy procedure. 

## 2012-05-28 ENCOUNTER — Encounter (HOSPITAL_COMMUNITY): Payer: Self-pay | Admitting: Pharmacy Technician

## 2012-05-28 NOTE — H&P (Signed)
Reason For Visit     Seen today as a work-in for acute on-set left renal colic pain and nausea X several hrs.   Active Problems Problems  1. Nephrolithiasis 592.0 2. Ureteral Stone Left 592.1  History of Present Illness            66 YO male patient of Dr. Vevelyn Royals seen today as a work-in for acute left renal colic pain and nausea X several hrs.  Last seen 04/22/12 as a follow-up visit.  GU HX:  1) Urolithiasis: He has a history of calcium oxalate urolithiasis and hypercalciuria.  Jul 1983: Right pyelolithotomy Sep 1995: R ureteroscopic stone removal Nov 1995: R ESWL  Last spontanously stone about 13 yrs ago.  2) Urinary retention: He presented to me as a new patient in August 2013 with postoperative urinary retention after a lumbar laminectomy. He was started on alpha blocker therapy and failed his intial voiding trial.  Interval history: Acute on-set left flank pain radiating into left mid abdomen with nausea. No hematuira or vomiting.   Past Medical History Problems  1. History of  Esophageal Reflux 530.81 2. History of  Gout 274.9 3. History of  Hypercholesterolemia 272.0 4. History of  Hypertension 401.9 5. History of  Nephrolithiasis V13.01  Surgical History Problems  1. History of  Back Surgery 2. History of  Lithotripsy 3. History of  Lithotripsy - Whole Body (Extracorporeal Shock Wave) 4. History of  Pyelotomy With Lithotomy  Current Meds 1. Allopurinol 100 MG Oral Tablet; Therapy: 06Aug2012 to 2. AmLODIPine Besylate TABS; Therapy: (Recorded:28Aug2013) to 3. Aspirin TABS; Therapy: (Recorded:28Aug2013) to 4. Centrum Silver TABS; Therapy: (Recorded:28Aug2013) to 5. Crestor TABS; Therapy: (Recorded:28Aug2013) to 6. Cyclobenzaprine HCl 10 MG Oral Tablet; Therapy: (Recorded:28Aug2013) to 7. Fish Oil CAPS; Therapy: (Recorded:28Aug2013) to 8. Hydrocodone-Acetaminophen TABS; Therapy: (Recorded:28Aug2013) to 9. Klor-Con M20 20 MEQ Oral Tablet Extended Release;  Therapy: 03Jul2013 to 10. Loratadine 10 MG Oral Tablet; Therapy: (Recorded:28Aug2013) to 11. Micardis 80 MG Oral Tablet; Therapy: 06Jun2012 to 12. Omeprazole TBEC; Therapy: (Recorded:28Aug2013) to 13. Tamsulosin HCl 0.4 MG Oral Capsule; TAKE ONE CAPSULE BY MOUTH AT BEDTIME; Therapy:   10Sep2013 to (Evaluate:05Sep2014)  Requested for: 10Sep2013; Last Rx:10Sep2013  Allergies Medication  1. No Known Drug Allergies  Family History Problems  1. Family history of  Death In The Family Father prostate cancer-age 76 2. Paternal history of  Prostate Cancer V16.42  Social History Problems  1. Marital History - Currently Married 2. Never A Smoker  Review of Systems Genitourinary, constitutional, skin, eye, otolaryngeal, hematologic/lymphatic, cardiovascular, pulmonary, endocrine, musculoskeletal, gastrointestinal, neurological and psychiatric system(s) were reviewed and pertinent findings if present are noted.  Gastrointestinal: nausea, flank pain and abdominal pain.    Vitals Vital Signs [Data Includes: Last 1 Day]  06Nov2013 02:00PM  Blood Pressure: 146 / 86 Temperature: 97.5 F Heart Rate: 94  Physical Exam Constitutional: Well nourished and well developed . No acute distress. The patient appears well hydrated.  Abdomen: The abdomen is rounded. No tenderness in the RLQ and no LLQ tenderness. No right CVA tenderness and mild left CVA tenderness.  Skin: Normal skin turgor and normal skin color and pigmentation.  Neuro/Psych:. Mood and affect are appropriate.    Results/Data Urine [Data Includes: Last 1 Day]   06Nov2013  COLOR YELLOW   APPEARANCE CLEAR   SPECIFIC GRAVITY 1.020   pH 6.0   GLUCOSE NEG mg/dL  BILIRUBIN NEG   KETONE NEG mg/dL  BLOOD NEG   PROTEIN NEG mg/dL  UROBILINOGEN 0.2  mg/dL  NITRITE NEG   LEUKOCYTE ESTERASE NEG    The following images/tracing/specimen were independently visualized:  CTU: bilateral nonobstructing renal calculi. Mild left hydronephrosis  secondary to upper left 7mm ureteral calculus. No distal calcification noted.  The following clinical lab reports were reviewed:  UA. Selected Results  AU CT-STONE PROTOCOL 06Nov2013 12:00AM Jetta Lout   Test Name Result Flag Reference  ** RADIOLOGY REPORT BY [facility] **   *RADIOLOGY REPORT*  Clinical Data: Left-sided flank pain.  CT ABDOMEN AND PELVIS WITHOUT CONTRAST (URINARY CALCULUS PROTOCOL)  Technique: Multidetector CT imaging was performed through the abdomen and pelvis without intravenous contrast to include the urinary tract.  Comparison: No priors.  Findings:  Lung Bases: Atherosclerotic calcifications in the distal right coronary artery. Otherwise, unremarkable.  Abdomen/Pelvis: Image 38 of series 5 demonstrates an 8 mm calculus at the left ureteropelvic junction. On image 41 of series 2 there is an additional 1 mm calcification in the proximal third of the left ureter just distal to the previously described UPJ stone, that may represent an additional ureteral calculus. This is associated with mild fullness of the left renal collecting system without frank hydronephrosis. Numerous additional nonobstructive calculi are noted within the collecting systems of the kidneys bilaterally, measuring up to 7 mm in the left lower pole and 4 mm in the interpolar collecting system of the right kidney. No additional ureteral calculi or bladder calculi are otherwise noted.  The unenhanced appearance of the visualized liver, gallbladder, the pancreas, spleen and bilateral adrenal glands is unremarkable. There is atherosclerosis throughout the abdominal and pelvic vasculature, without definite aneurysm. No ascites or pneumoperitoneum and no pathologic distension of small bowel. No definite pathologic lymphadenopathy identified within the abdomen or pelvis on this noncontrast CT examination. Normal appendix. There are a few colonic diverticula, without  surrounding inflammatory changes to suggest acute diverticulitis at this time.  Musculoskeletal: Status post PLIF and L4-L5 with an interbody graft at the L4-L5 interspace. There are no aggressive appearing lytic or blastic lesions noted in the visualized portions of the skeleton.  IMPRESSION: 1. Partially obstructive 8 mm calculus at the left ureteropelvic junction with an additional 1 mm calculus in the proximal third of the left ureter just distal to the left at the UPJ with mild fullness in the left renal collecting system. 2. Additional nonobstructive calculi throughout the renal collecting systems bilaterally. 3. 2.5 cm exophytic low attenuation lesion extending from the upper pole of the right kidney is incompletely characterized on this noncontrast CT examination, but is statistically likely to represent a cyst. This could be further evaluated with ultrasound if of clinical concern.  4. Extensive atherosclerosis including right coronary artery disease. 5. Normal appendix. 6. Mild colonic diverticulosis without findings to suggest acute diverticulitis at this time.  These results will be called to the ordering clinician or representative by the Radiologist Assistant, and communication documented in the PACS Dashboard.   Original Report Authenticated By: Trudie Reed, M.D.   Assessment Assessed  1. Nephrolithiasis 592.0 2. Ureteral Stone Left 592.1      Ketorolac 60 mg IM. Hydrocodone 7.5/325 mg 1-2 po Q4-6 hrs prn. May use NSAID prn. Ondansteron 8 mg 1 po Q 6 hrs prn. Given strainer. Instructed to strain urine. If stone captured bring to office for analysis. Will have pt RTC in 2-3 weeks for f/u w/Dr. Laverle Patter. Will try MET but understands if he has any acute changes needs to RTC sooner. If he fails MET may need to  consider either (L) ESWL vs cystoureterscopy, L RPG, stone extraction, and possible double J stent   Plan  Health Maintenance (V70.0)  1. UA With REFLEX   Done: 06Nov2013 01:52PM Nephrolithiasis (592.0)  2. Ketorolac Tromethamine 60 MG/2ML Injection Solution; INJECT 60  MG Intramuscular; Done:  06Nov2013 03:02PM; Status: COMPLETE Nephrolithiasis (592.0), Ureteral Stone (592.1)  3. Hydrocodone-Acetaminophen 7.5-325 MG Oral Tablet; TAKE 1 TO 2 TABLETS EVERY 4 TO 6  HOURS AS NEEDED FOR PAIN; Therapy: 06Nov2013 to (Evaluate:10Nov2013); Last  Rx:06Nov2013 Ureteral Stone (592.1)  4. Ondansetron 8 MG Oral Tablet Dispersible; TAKE 1 TABLET Every 6 hours PRN; Therapy:  06Nov2013 to (Last Rx:06Nov2013)  Requested for: 06Nov2013; Edited 5. KUB  Requested for: 06Nov2013 6. Follow-up MD/NP/PA Office  Follow-up  Requested for: 06Nov2013   Ketorolac 60 mg IM Hydrocodone 7.5/325 mg 1-2 po Q4-6 hrs prn #40/0RF Ondersteron 8 mg 1 po Q6 hrs prn #30/0RF RTC in 2-3 weeks  AU CT-STONE PROTOCOL  Status: Complete  Done: 01Jan0001 12:00AM Ordered Today; For: Nephrolithiasis (592.0); Ordered By: Jetta Lout  Due: 08Nov2013 Marked Important   Signatures Electronically signed by : Jetta Lout, Dyann Ruddle; May 21 2012  3:38PM  Addendum:  Documentation  I spoke with Mr. Aikman today by phone after reviewing his recent CT scan and clinical notes. He appears to have a radio opaque 8 mm left proximal ureteral stone with Hounsfield units measuring approximately 630. We reviewed options for treatment including ureteroscopic laser lithotripsy versus shockwave lithotripsy. We also discussed the low likelihood that he would pass the stone of that size. He does wish to proceed with shockwave lithotripsy and we reviewed the potential risks, complications, and expected recovery process. He gives his informed consent. This will be arranged.   Signatures Electronically signed by : Heloise Purpura, M.D.; May 22 2012  3:53PM

## 2012-05-29 ENCOUNTER — Encounter (HOSPITAL_COMMUNITY): Payer: Self-pay | Admitting: *Deleted

## 2012-05-29 ENCOUNTER — Encounter (HOSPITAL_COMMUNITY)
Admission: RE | Disposition: A | Discharge status: Home / Self Care | Payer: Self-pay | Source: Ambulatory Visit | Attending: Urology

## 2012-05-29 ENCOUNTER — Ambulatory Visit (HOSPITAL_COMMUNITY)
Admission: RE | Admit: 2012-05-29 | Discharge: 2012-05-29 | Disposition: A | Discharge status: Home / Self Care | Payer: Medicare Other | Source: Ambulatory Visit | Attending: Urology | Admitting: Urology

## 2012-05-29 ENCOUNTER — Ambulatory Visit (HOSPITAL_COMMUNITY): Payer: Medicare Other

## 2012-05-29 DIAGNOSIS — N289 Disorder of kidney and ureter, unspecified: Secondary | ICD-10-CM | POA: Insufficient documentation

## 2012-05-29 DIAGNOSIS — N201 Calculus of ureter: Secondary | ICD-10-CM | POA: Insufficient documentation

## 2012-05-29 DIAGNOSIS — K219 Gastro-esophageal reflux disease without esophagitis: Secondary | ICD-10-CM | POA: Insufficient documentation

## 2012-05-29 DIAGNOSIS — I2510 Atherosclerotic heart disease of native coronary artery without angina pectoris: Secondary | ICD-10-CM | POA: Insufficient documentation

## 2012-05-29 DIAGNOSIS — E7800 Pure hypercholesterolemia, unspecified: Secondary | ICD-10-CM | POA: Insufficient documentation

## 2012-05-29 DIAGNOSIS — N200 Calculus of kidney: Secondary | ICD-10-CM | POA: Insufficient documentation

## 2012-05-29 DIAGNOSIS — K5730 Diverticulosis of large intestine without perforation or abscess without bleeding: Secondary | ICD-10-CM | POA: Insufficient documentation

## 2012-05-29 DIAGNOSIS — Z79899 Other long term (current) drug therapy: Secondary | ICD-10-CM | POA: Insufficient documentation

## 2012-05-29 DIAGNOSIS — I10 Essential (primary) hypertension: Secondary | ICD-10-CM | POA: Insufficient documentation

## 2012-05-29 SURGERY — LITHOTRIPSY, ESWL
Anesthesia: LOCAL | Laterality: Left

## 2012-05-29 MED ORDER — DEXTROSE-NACL 5-0.45 % IV SOLN
INTRAVENOUS | Status: DC
  Administered 2012-05-29: 16:00:00 via INTRAVENOUS

## 2012-05-29 MED ORDER — DIPHENHYDRAMINE HCL 25 MG PO CAPS
25.0000 mg | ORAL_CAPSULE | ORAL | Status: AC
  Administered 2012-05-29: 25 mg via ORAL
  Filled 2012-05-29: qty 1

## 2012-05-29 MED ORDER — DIAZEPAM 5 MG PO TABS
10.0000 mg | ORAL_TABLET | ORAL | Status: AC
  Administered 2012-05-29: 10 mg via ORAL
  Filled 2012-05-29: qty 2

## 2012-05-29 MED ORDER — CIPROFLOXACIN HCL 500 MG PO TABS
500.0000 mg | ORAL_TABLET | ORAL | Status: AC
  Administered 2012-05-29: 500 mg via ORAL
  Filled 2012-05-29: qty 1

## 2012-05-29 NOTE — Op Note (Signed)
See paper chart for operative note. 

## 2012-07-10 ENCOUNTER — Other Ambulatory Visit: Payer: Self-pay | Admitting: Urology

## 2012-07-17 ENCOUNTER — Encounter (HOSPITAL_COMMUNITY): Payer: Self-pay | Admitting: Pharmacy Technician

## 2012-07-21 ENCOUNTER — Encounter (HOSPITAL_COMMUNITY)
Admission: RE | Admit: 2012-07-21 | Discharge: 2012-07-21 | Disposition: A | Discharge status: Home / Self Care | Payer: Medicare Other | Source: Ambulatory Visit | Attending: Urology | Admitting: Urology

## 2012-07-21 ENCOUNTER — Encounter (HOSPITAL_COMMUNITY): Payer: Self-pay

## 2012-07-21 LAB — SURGICAL PCR SCREEN
MRSA, PCR: NEGATIVE
Staphylococcus aureus: NEGATIVE

## 2012-07-21 LAB — BASIC METABOLIC PANEL
CO2: 27 mEq/L (ref 19–32)
Calcium: 10.3 mg/dL (ref 8.4–10.5)
GFR calc Af Amer: 48 mL/min — ABNORMAL LOW (ref >90)
GFR calc non Af Amer: 42 mL/min — ABNORMAL LOW (ref >90)
Sodium: 141 mEq/L (ref 135–145)

## 2012-07-21 NOTE — Patient Instructions (Addendum)
20 [name] [name]  07/21/2012   Your procedure is scheduled on:   07-28-2012  Report to [facility] at     1215 PM.  Call this number if you have problems the morning of surgery: [phone]  Or Presurgical Testing [phone](Makynzie Dobesh)   Remember: Follow any bowel prep instructions per MD office. For Cpap use: Bring mask and tubing only.   Do not eat food:After Midnight.  May have clear liquids:up to 6 Hours before arrival. Nothing after : 0800 AM  Clear liquids include soda, tea, black coffee, apple or grape juice, broth.  Take these medicines the morning of surgery with A SIP OF WATER: Allopurinol. Amlodipine. Loratadine. Omeprazole.     Do not wear jewelry, make-up or nail polish.  Do not wear lotions, powders, or perfumes. You may wear deodorant.  Do not shave 48 hours prior to surgery.(face and neck okay, no shaving of legs)  Do not bring valuables to the hospital.  Contacts, dentures or bridgework,body piercing,  may not be worn into surgery.  Leave suitcase in the car. After surgery it may be brought to your room.  For patients admitted to the hospital, checkout time is 11:00 AM the day of discharge.   Patients discharged the day of surgery will not be allowed to drive home. Must have responsible person with you x 24 hours once discharged.  Name and phone number of your driver:Becky Revelo, spouse [phone] will use pt. cell   Special Instructions: CHG Shower Use Special Wash: see special instructions.(avoid face and genitals)   Please read over the following fact sheets that you were given: MRSA Information.   Failure to follow these instructions may result in Cancellation of your surgery.   Patient signature_______________________________________________________

## 2012-07-21 NOTE — Pre-Procedure Instructions (Signed)
07-21-12 EKG/ CXR 8'13-Epic.

## 2012-07-25 NOTE — H&P (Signed)
History of Present Illness  Mr. [name] [name] is a 67 year old previously followed by Dr. Aldean Ast with the following urologic history:  1) Urolithiasis: He has a history of calcium oxalate urolithiasis and hypercalciuria.  Jul 1983: Right pyelolithotomy Sep 1995: R ureteroscopic stone removal Nov 1995: R ESWL Nov 2013: L ESWL 7 mm ureteral stone  2) Urinary retention: He presented to me as a new patient in August 2013 with postoperative urinary retention after a lumbar laminectomy. He was started on alpha blocker therapy and failed his intial voiding trial.  Interval history:  He follows up today for further evaluation of his left ureteral stone. He has remained completely asymptomatic and denies any flank pain or hematuria specifically. He has continued medical expulsion therapy and has been straining his urine but has not noted passing any stone.   Past Medical History Problems  1. History of  Esophageal Reflux 530.81 2. History of  Gout 274.9 3. History of  Hypercholesterolemia 272.0 4. History of  Hypertension 401.9 5. History of  Nephrolithiasis V13.01  Surgical History Problems  1. History of  Back Surgery 2. History of  Lithotripsy 3. History of  Lithotripsy 4. History of  Lithotripsy - Whole Body (Extracorporeal Shock Wave) 5. History of  Pyelotomy With Lithotomy  Current Meds 1. Allopurinol 100 MG Oral Tablet; Therapy: 06Aug2012 to 2. AmLODIPine Besylate TABS; Therapy: (Recorded:28Aug2013) to 3. Aspirin TABS; Therapy: (Recorded:28Aug2013) to 4. Centrum Silver TABS; Therapy: (Recorded:28Aug2013) to 5. Colcrys 0.6 MG Oral Tablet; Therapy: 23Sep2013 to 6. Crestor TABS; Therapy: (Recorded:28Aug2013) to 7. Cyclobenzaprine HCl 10 MG Oral Tablet; Therapy: (Recorded:28Aug2013) to 8. Fish Oil CAPS; Therapy: (Recorded:28Aug2013) to 9. Hydrocodone-Acetaminophen 7.5-325 MG Oral Tablet; TAKE 1 TO 2 TABLETS EVERY 4 TO 6  HOURS AS NEEDED FOR PAIN; Therapy: 06Nov2013 to  (Evaluate:10Nov2013); Last  Rx:06Nov2013 10. Hydrocodone-Acetaminophen TABS; Therapy: (Recorded:28Aug2013) to 11. Indomethacin 50 MG Oral Capsule; Therapy: 23Sep2013 to 12. Klor-Con M20 20 MEQ Oral Tablet Extended Release; Therapy: 03Jul2013 to 13. Loratadine 10 MG Oral Tablet; Therapy: (Recorded:28Aug2013) to 14. Micardis 80 MG Oral Tablet; Therapy: 06Jun2012 to 15. Omeprazole TBEC; Therapy: (Recorded:28Aug2013) to 16. Ondansetron 8 MG Oral Tablet Dispersible; TAKE 1 TABLET Every 6 hours PRN; Therapy:   06Nov2013 to (Last Rx:06Nov2013)  Requested for: 06Nov2013 17. Tamsulosin HCl 0.4 MG Oral Capsule; TAKE 1 CAPSULE Daily; Therapy: 14Nov2013 to   (Evaluate:28Nov2013)  Requested for: 14Nov2013; Last Rx:14Nov2013 18. Tamsulosin HCl 0.4 MG Oral Capsule; TAKE ONE CAPSULE BY MOUTH AT BEDTIME; Therapy:   10Sep2013 to (Evaluate:05Sep2014)  Requested for: 10Sep2013; Last Rx:10Sep2013  Allergies Medication  1. No Known Drug Allergies  Family History Problems  1. Family history of  Death In The Family Father prostate cancer-age 28 2. Paternal history of  Prostate Cancer V16.42  Social History Problems  1. Marital History - Currently Married 2. Never A Smoker  Vitals Vital Signs [Data Includes: Last 1 Day]  24Dec2013 10:52AM  Blood Pressure: 132 / 84 Heart Rate: 84  Physical Exam Constitutional: Well nourished and well developed . No acute distress.  Abdomen: No CVA tenderness.    Results/Data Urine [Data Includes: Last 1 Day]   24Dec2013  COLOR YELLOW   APPEARANCE CLEAR   SPECIFIC GRAVITY 1.020   pH 6.0   GLUCOSE NEG mg/dL  BILIRUBIN NEG   KETONE NEG mg/dL  BLOOD NEG   PROTEIN NEG mg/dL  UROBILINOGEN 0.2 mg/dL  NITRITE NEG   LEUKOCYTE ESTERASE NEG     I independently reviewed his KUB x-ray. He continues  to have a 7 mm calcification in the vicinity of the distal left ureter consistent with a ureteral stone. This is unchanged in position from his prior KUB on 12/12.    Assessment Assessed  1. Nephrolithiasis 592.0 2. Ureteral Stone Left 592.1  Plan Health Maintenance (V70.0)  1. UA With REFLEX  Done: 24Dec2013 10:18AM Ureteral Stone (592.1)  2. Follow-up Office  Follow-up  Requested for: 24Dec2013  Discussion/Summary  1. Left ureteral calculus: He unfortunately has not been able to pass a stone thus far. He is asymptomatic but will tentatively be scheduled for definitive management of his ureteral stone. We have reviewed options for treatment and he has elected to proceed with left ureteroscopic laser lithotripsy. We reviewed the potential risks, complications, and expected recovery process. He gives his informed consent and this will be arranged in the near future. In the meantime, he'll notify me if he passes a stone or if he should develop severe pain, fever, or persistent nausea/vomiting.  2. History of urinary retention: He will plan to follow up as scheduled in the spring with a PVR/IPSS questionnaire.  CC: Dr. Johnella Moloney     Signatures Electronically signed by : Heloise Purpura, M.D.; Jul 08 2012 11:07AM

## 2012-07-28 ENCOUNTER — Encounter (HOSPITAL_COMMUNITY)
Admission: RE | Disposition: A | Discharge status: Home / Self Care | Payer: Self-pay | Source: Ambulatory Visit | Attending: Urology

## 2012-07-28 ENCOUNTER — Ambulatory Visit (HOSPITAL_COMMUNITY): Payer: Medicare Other

## 2012-07-28 ENCOUNTER — Encounter (HOSPITAL_COMMUNITY): Payer: Self-pay | Admitting: Anesthesiology

## 2012-07-28 ENCOUNTER — Ambulatory Visit (HOSPITAL_COMMUNITY)
Admission: RE | Admit: 2012-07-28 | Discharge: 2012-07-28 | Disposition: A | Discharge status: Home / Self Care | Payer: Medicare Other | Source: Ambulatory Visit | Attending: Urology | Admitting: Urology

## 2012-07-28 ENCOUNTER — Ambulatory Visit (HOSPITAL_COMMUNITY): Payer: Medicare Other | Admitting: Anesthesiology

## 2012-07-28 DIAGNOSIS — E7800 Pure hypercholesterolemia, unspecified: Secondary | ICD-10-CM | POA: Insufficient documentation

## 2012-07-28 DIAGNOSIS — Z7982 Long term (current) use of aspirin: Secondary | ICD-10-CM | POA: Insufficient documentation

## 2012-07-28 DIAGNOSIS — K219 Gastro-esophageal reflux disease without esophagitis: Secondary | ICD-10-CM | POA: Insufficient documentation

## 2012-07-28 DIAGNOSIS — N201 Calculus of ureter: Secondary | ICD-10-CM | POA: Insufficient documentation

## 2012-07-28 DIAGNOSIS — I10 Essential (primary) hypertension: Secondary | ICD-10-CM | POA: Insufficient documentation

## 2012-07-28 DIAGNOSIS — Z79899 Other long term (current) drug therapy: Secondary | ICD-10-CM | POA: Insufficient documentation

## 2012-07-28 HISTORY — PX: CYSTOSCOPY WITH URETEROSCOPY: SHX5123

## 2012-07-28 HISTORY — PX: HOLMIUM LASER APPLICATION: SHX5852

## 2012-07-28 SURGERY — CYSTOSCOPY WITH URETEROSCOPY
Anesthesia: General | Laterality: Left | Wound class: Clean Contaminated

## 2012-07-28 MED ORDER — PROMETHAZINE HCL 25 MG/ML IJ SOLN: 6.2500 mg | INTRAMUSCULAR | Status: DC | PRN

## 2012-07-28 MED ORDER — LACTATED RINGERS IV SOLN
INTRAVENOUS | Status: DC | PRN
  Administered 2012-07-28: 13:00:00 via INTRAVENOUS

## 2012-07-28 MED ORDER — IOHEXOL 300 MG/ML  SOLN
INTRAMUSCULAR | Status: AC
  Filled 2012-07-28: qty 1

## 2012-07-28 MED ORDER — PROPOFOL 10 MG/ML IV EMUL
INTRAVENOUS | Status: DC | PRN
Start: 2012-07-28 — End: 2012-07-28
  Administered 2012-07-28: 200 mg via INTRAVENOUS

## 2012-07-28 MED ORDER — CIPROFLOXACIN IN D5W 400 MG/200ML IV SOLN: INTRAVENOUS | Status: DC | PRN

## 2012-07-28 MED ORDER — CIPROFLOXACIN IN D5W 400 MG/200ML IV SOLN
INTRAVENOUS | Status: AC
  Filled 2012-07-28: qty 200

## 2012-07-28 MED ORDER — FENTANYL CITRATE 0.05 MG/ML IJ SOLN: 25.0000 ug | INTRAMUSCULAR | Status: DC | PRN

## 2012-07-28 MED ORDER — HYDROCODONE-ACETAMINOPHEN 5-325 MG PO TABS: 1.0000 | ORAL_TABLET | Freq: Four times a day (QID) | ORAL | Status: DC | PRN

## 2012-07-28 MED ORDER — FENTANYL CITRATE 0.05 MG/ML IJ SOLN
INTRAMUSCULAR | Status: DC | PRN
  Administered 2012-07-28 (×2): 50 ug via INTRAVENOUS

## 2012-07-28 MED ORDER — KETOROLAC TROMETHAMINE 30 MG/ML IJ SOLN
15.0000 mg | Freq: Once | INTRAMUSCULAR | Status: AC | PRN
  Administered 2012-07-28: 30 mg via INTRAVENOUS
  Filled 2012-07-28: qty 1

## 2012-07-28 MED ORDER — CIPROFLOXACIN HCL 250 MG PO TABS: 250.0000 mg | ORAL_TABLET | Freq: Two times a day (BID) | ORAL | Status: DC

## 2012-07-28 MED ORDER — CIPROFLOXACIN IN D5W 400 MG/200ML IV SOLN
400.0000 mg | INTRAVENOUS | Status: AC
  Administered 2012-07-28: 400 mg via INTRAVENOUS

## 2012-07-28 MED ORDER — SODIUM CHLORIDE 0.9 % IR SOLN
Status: DC | PRN
  Administered 2012-07-28: 4000 mL

## 2012-07-28 MED ORDER — LIDOCAINE HCL 1 % IJ SOLN
INTRAMUSCULAR | Status: DC | PRN
  Administered 2012-07-28: 60 mg via INTRADERMAL

## 2012-07-28 SURGICAL SUPPLY — 17 items
ADAPTER CATH URET PLST 4-6FR (CATHETERS) IMPLANT
BAG URO CATCHER STRL LF (DRAPE) ×2 IMPLANT
BASKET ZERO TIP NITINOL 2.4FR (BASKET) ×2 IMPLANT
CATH INTERMIT  6FR 70CM (CATHETERS) ×2 IMPLANT
CLOTH BEACON ORANGE TIMEOUT ST (SAFETY) ×2 IMPLANT
DRAPE C-ARM 42X72 X-RAY (DRAPES) ×2 IMPLANT
DRAPE CAMERA CLOSED 9X96 (DRAPES) ×2 IMPLANT
GLOVE BIOGEL M STRL SZ7.5 (GLOVE) ×6 IMPLANT
GOWN PREVENTION PLUS XLARGE (GOWN DISPOSABLE) ×2 IMPLANT
GOWN STRL NON-REIN LRG LVL3 (GOWN DISPOSABLE) ×4 IMPLANT
GUIDEWIRE ANG ZIPWIRE 038X150 (WIRE) IMPLANT
GUIDEWIRE STR DUAL SENSOR (WIRE) ×2 IMPLANT
LASER FIBER DISP (UROLOGICAL SUPPLIES) ×2 IMPLANT
MANIFOLD NEPTUNE II (INSTRUMENTS) ×2 IMPLANT
PACK CYSTO (CUSTOM PROCEDURE TRAY) ×2 IMPLANT
TUBING CONNECTING 10 (TUBING) ×2 IMPLANT
WIRE COONS/BENSON .038X145CM (WIRE) IMPLANT

## 2012-07-28 NOTE — Preoperative (Signed)
Beta Blockers   Reason not to administer Beta Blockers:Not Applicable 

## 2012-07-28 NOTE — Interval H&P Note (Signed)
History and Physical Interval Note:  07/28/2012 1:14 PM  [name] [name]  has presented today for surgery, with the diagnosis of Left Ureteral Calculus  The various methods of treatment have been discussed with the patient and family. After consideration of risks, benefits and other options for treatment, the patient has consented to  Procedure(s) (LRB) with comments: CYSTOSCOPY WITH URETEROSCOPY and stone removal (Left) - POSSIBLE LEFT URETERAL STENT  C-ARM   HOLMIUM LASER APPLICATION (Left) as a surgical intervention .  The patient's history has been reviewed, patient examined, no change in status, stable for surgery.  I have reviewed the patient's chart and labs.  Questions were answered to the patient's satisfaction.     Markie Frith,LES

## 2012-07-28 NOTE — Transfer of Care (Signed)
Immediate Anesthesia Transfer of Care Note  Patient: [name] [name]  Procedure(s) Performed: Procedure(s) (LRB) with comments: CYSTOSCOPY WITH URETEROSCOPY (Left) -  C-ARM   HOLMIUM LASER APPLICATION (Left) - left lithotripsy  Patient Location: PACU  Anesthesia Type:General  Level of Consciousness: awake, alert , oriented and patient cooperative  Airway & Oxygen Therapy: Patient Spontanous Breathing and Patient connected to face mask oxygen  Post-op Assessment: Report given to PACU RN, Post -op Vital signs reviewed and stable and Patient moving all extremities X 4  Post vital signs: Reviewed and stable  Complications: No apparent anesthesia complications

## 2012-07-28 NOTE — Anesthesia Postprocedure Evaluation (Signed)
  Anesthesia Post-op Note  Patient: [name] [name]  Procedure(s) Performed: Procedure(s) (LRB): CYSTOSCOPY WITH URETEROSCOPY (Left) HOLMIUM LASER APPLICATION (Left)  Patient Location: PACU  Anesthesia Type: General  Level of Consciousness: awake and alert   Airway and Oxygen Therapy: Patient Spontanous Breathing  Post-op Pain: mild  Post-op Assessment: Post-op Vital signs reviewed, Patient's Cardiovascular Status Stable, Respiratory Function Stable, Patent Airway and No signs of Nausea or vomiting  Last Vitals:  Filed Vitals:   07/28/12 1445  BP: 130/84  Pulse: 94  Temp: 36.4 C  Resp: 13    Post-op Vital Signs: stable   Complications: No apparent anesthesia complications

## 2012-07-28 NOTE — Op Note (Signed)
Preoperative diagnosis: Left ureteral calculus  Postoperative diagnosis: Left ureteral calculus  Procedure:  1. Cystoscopy 2. Left ureteroscopy and stone removal 3. Ureteroscopic laser lithotripsy 4. Left retrograde pyelography with interpretation  Surgeon: Moody Bruins. M.D.  Anesthesia: General  Complications: None  Intraoperative findings: Left retrograde pyelography demonstrated a filling defect within the distal left ureter consistent with the patient's known calculus without other abnormalities.  EBL: Minimal  Specimens: 1. Left ureteral calculus  Disposition of specimens: [facility] for stone analysis  Indication: [name] [name] is a 67 y.o. year old patient with urolithiasis. After reviewing the management options for treatment, the patient elected to proceed with the above surgical procedure(s). We have discussed the potential benefits and risks of the procedure, side effects of the proposed treatment, the likelihood of the patient achieving the goals of the procedure, and any potential problems that might occur during the procedure or recuperation. Informed consent has been obtained.  Description of procedure:  The patient was taken to the operating room and general anesthesia was induced.  The patient was placed in the dorsal lithotomy position, prepped and draped in the usual sterile fashion, and preoperative antibiotics were administered. A preoperative time-out was performed.   Cystourethroscopy was performed.  The patient's urethra was examined and was normal. The bladder was then systematically examined in its entirety. There was no evidence for any bladder tumors, stones, or other mucosal pathology.    Attention then turned to the left ureteral orifice and a ureteral catheter was used to intubate the ureteral orifice.  Omnipaque contrast was injected through the ureteral catheter and a retrograde pyelogram was performed with findings as  dictated above.  A 0.38 sensor guidewire was then advanced up the left ureter into the renal pelvis under fluoroscopic guidance. The 6 Fr semirigid ureteroscope was then advanced into the ureter next to the guidewire and the calculus was identified.   The stone was then fragmented with the 365 micron holmium laser fiber on a setting of 0.6 J and frequency of 6 Hz.   All stones were then removed from the ureter with a zero tip nitinol basket.  Reinspection of the ureter revealed no remaining visible stones or fragments.   The bladder was then emptied and the procedure ended.  The patient appeared to tolerate the procedure well and without complications.  The patient was able to be awakened and transferred to the recovery unit in satisfactory condition.

## 2012-07-28 NOTE — Anesthesia Preprocedure Evaluation (Addendum)
Anesthesia Evaluation  Patient identified by MRN, date of birth, ID band Patient awake    Reviewed: Allergy & Precautions, H&P , NPO status , Patient's Chart, lab work & pertinent test results  Airway Mallampati: II TM Distance: <3 FB Neck ROM: Full    Dental No notable dental hx.    Pulmonary neg pulmonary ROS,  breath sounds clear to auscultation  Pulmonary exam normal       Cardiovascular hypertension, Pt. on medications Rhythm:Regular Rate:Normal     Neuro/Psych negative neurological ROS  negative psych ROS   GI/Hepatic negative GI ROS, Neg liver ROS,   Endo/Other  negative endocrine ROS  Renal/GU negative Renal ROS  negative genitourinary   Musculoskeletal negative musculoskeletal ROS (+)   Abdominal   Peds negative pediatric ROS (+)  Hematology negative hematology ROS (+)   Anesthesia Other Findings   Reproductive/Obstetrics negative OB ROS                         Anesthesia Physical Anesthesia Plan  ASA: II  Anesthesia Plan: General   Post-op Pain Management:    Induction: Intravenous  Airway Management Planned: LMA  Additional Equipment:   Intra-op Plan:   Post-operative Plan:   Informed Consent: I have reviewed the patients History and Physical, chart, labs and discussed the procedure including the risks, benefits and alternatives for the proposed anesthesia with the patient or authorized representative who has indicated his/her understanding and acceptance.   Dental advisory given  Plan Discussed with: CRNA and Surgeon  Anesthesia Plan Comments:         Anesthesia Quick Evaluation  

## 2012-07-29 ENCOUNTER — Encounter (HOSPITAL_COMMUNITY): Payer: Self-pay | Admitting: Urology

## 2013-12-04 ENCOUNTER — Encounter (HOSPITAL_COMMUNITY): Payer: Self-pay | Admitting: Emergency Medicine

## 2013-12-04 ENCOUNTER — Emergency Department (HOSPITAL_COMMUNITY): Payer: Medicare Other

## 2013-12-04 ENCOUNTER — Emergency Department (HOSPITAL_COMMUNITY)
Admission: EM | Admit: 2013-12-04 | Discharge: 2013-12-04 | Disposition: A | Discharge status: Home / Self Care | Payer: Medicare Other | Attending: Emergency Medicine | Admitting: Emergency Medicine

## 2013-12-04 DIAGNOSIS — K219 Gastro-esophageal reflux disease without esophagitis: Secondary | ICD-10-CM | POA: Insufficient documentation

## 2013-12-04 DIAGNOSIS — IMO0002 Reserved for concepts with insufficient information to code with codable children: Secondary | ICD-10-CM | POA: Insufficient documentation

## 2013-12-04 DIAGNOSIS — Z8701 Personal history of pneumonia (recurrent): Secondary | ICD-10-CM | POA: Insufficient documentation

## 2013-12-04 DIAGNOSIS — Z23 Encounter for immunization: Secondary | ICD-10-CM | POA: Insufficient documentation

## 2013-12-04 DIAGNOSIS — G8929 Other chronic pain: Secondary | ICD-10-CM | POA: Insufficient documentation

## 2013-12-04 DIAGNOSIS — S0190XA Unspecified open wound of unspecified part of head, initial encounter: Secondary | ICD-10-CM | POA: Insufficient documentation

## 2013-12-04 DIAGNOSIS — Z79899 Other long term (current) drug therapy: Secondary | ICD-10-CM | POA: Insufficient documentation

## 2013-12-04 DIAGNOSIS — Y9289 Other specified places as the place of occurrence of the external cause: Secondary | ICD-10-CM | POA: Insufficient documentation

## 2013-12-04 DIAGNOSIS — E785 Hyperlipidemia, unspecified: Secondary | ICD-10-CM | POA: Insufficient documentation

## 2013-12-04 DIAGNOSIS — M109 Gout, unspecified: Secondary | ICD-10-CM | POA: Insufficient documentation

## 2013-12-04 DIAGNOSIS — Y93H2 Activity, gardening and landscaping: Secondary | ICD-10-CM | POA: Insufficient documentation

## 2013-12-04 DIAGNOSIS — Z87442 Personal history of urinary calculi: Secondary | ICD-10-CM | POA: Insufficient documentation

## 2013-12-04 DIAGNOSIS — S0191XA Laceration without foreign body of unspecified part of head, initial encounter: Secondary | ICD-10-CM

## 2013-12-04 DIAGNOSIS — I129 Hypertensive chronic kidney disease with stage 1 through stage 4 chronic kidney disease, or unspecified chronic kidney disease: Secondary | ICD-10-CM | POA: Insufficient documentation

## 2013-12-04 DIAGNOSIS — Z7982 Long term (current) use of aspirin: Secondary | ICD-10-CM | POA: Insufficient documentation

## 2013-12-04 DIAGNOSIS — N189 Chronic kidney disease, unspecified: Secondary | ICD-10-CM | POA: Insufficient documentation

## 2013-12-04 MED ORDER — TETANUS-DIPHTH-ACELL PERTUSSIS 5-2.5-18.5 LF-MCG/0.5 IM SUSP
0.5000 mL | Freq: Once | INTRAMUSCULAR | Status: AC
  Administered 2013-12-04: 0.5 mL via INTRAMUSCULAR
  Filled 2013-12-04: qty 0.5

## 2013-12-04 NOTE — ED Notes (Signed)
Patient transported to CT 

## 2013-12-04 NOTE — ED Provider Notes (Signed)
CSN: [id number]     Arrival date & time 12/04/13  1612 History   None    No chief complaint on file.  HPI This chart was scribed for nurse practitioner, Teressa LowerVrinda Bruna Dills working with No att. providers found, by Andrew Auaven Small, ED Scribe. This patient was seen in room WTR6/WTR6 and the patient's care was started at 4:29 PM.  [name] [name] is a 68 y.o. male who presents to the Emergency Department complaining of a head laceration onset PTA. Pt reports he was mowing his neighbors lawn when a limb scraped the top left side of his head. Pt denies syncope, nausa, emesis vision changes. Pt last TDAP was 10-15 years ago. Pt takes baby aspirin and BP medication. He reports h/o high cholesterol and kidney stones.  He report back surgery 2 years ago.   Past Medical History  Diagnosis Date  . History of kidney stones   . Hypertension     takes Norvasc and Micardis daily  . Hyperlipidemia     takes Crestor daily  . Pneumonia     history of double 30+yrs ago  . History of seasonal allergies     takes Loratadine daily  . Joint pain   . Chronic back pain     herniated disc  . GERD (gastroesophageal reflux disease)     takes Omeprazole daily  . Diverticulosis   . Nocturia   . Muscle cramps     takes KDUr and Mag Ox as a trial to see if it will help  . History of gout     takes Allopurinol daily  . Chronic kidney disease     Kidney stone   Past Surgical History  Procedure Laterality Date  . Cystoscopy  1993  . Foot surgery  6425yrs ago    right-removed bone spur   . Colonoscopy    . Esophagogastroduodenoscopy      with diliation  . Lithotripsy      x 2  . Vasectomy  9358yrs ago  . Back surgery  03-07-2012  . Cystoscopy with ureteroscopy  07/28/2012    Procedure: CYSTOSCOPY WITH URETEROSCOPY;  Surgeon: Crecencio McLes Borden, MD;  Location: [facility];  Service: Urology;  Laterality: Left;   C-ARM    . Holmium laser application  07/28/2012    Procedure: HOLMIUM LASER APPLICATION;  Surgeon: Crecencio McLes Borden, MD;   Location: [facility];  Service: Urology;  Laterality: Left;  left lithotripsy   No family history on file. History  Substance Use Topics  . Smoking status: Never Smoker   . Smokeless tobacco: Never Used  . Alcohol Use: No    Review of Systems  Eyes: Negative for visual disturbance.  Gastrointestinal: Negative for nausea and vomiting.  Skin: Positive for wound.  Neurological: Negative for syncope.   Allergies  Review of patient's allergies indicates no known allergies.  Home Medications   Prior to Admission medications   Medication Sig Start Date End Date Taking? Authorizing Provider  allopurinol (ZYLOPRIM) 100 MG tablet Take 100 mg by mouth every morning.     Historical Provider, MD  amLODipine (NORVASC) 5 MG tablet Take 5 mg by mouth every morning.     Historical Provider, MD  aspirin EC 81 MG tablet Take 81 mg by mouth daily.    Historical Provider, MD  cholecalciferol (VITAMIN D) 1000 UNITS tablet Take 1,000 Units by mouth daily.    Historical Provider, MD  ciprofloxacin (CIPRO) 250 MG tablet Take 1 tablet (250 mg total) by mouth 2 (  two) times daily. 07/28/12   Heloise Purpura, MD  HYDROcodone-acetaminophen (NORCO/VICODIN) 5-325 MG per tablet Take 1-2 tablets by mouth every 6 (six) hours as needed for pain. 07/28/12   Heloise Purpura, MD  loratadine (CLARITIN) 10 MG tablet Take 10 mg by mouth daily.    Historical Provider, MD  Multiple Vitamin (MULTIVITAMIN WITH MINERALS) TABS Take 1 tablet by mouth daily.    Historical Provider, MD  Omega-3 Fatty Acids (FISH OIL) 1200 MG CAPS Take 1 capsule by mouth daily.    Historical Provider, MD  omeprazole (PRILOSEC) 20 MG capsule Take 20 mg by mouth daily.    Historical Provider, MD  rosuvastatin (CRESTOR) 5 MG tablet Take 5 mg by mouth at bedtime.    Historical Provider, MD  Tamsulosin HCl (FLOMAX) 0.4 MG CAPS Take 0.4 mg by mouth daily after supper.    Historical Provider, MD  telmisartan (MICARDIS) 80 MG tablet Take 80 mg by mouth every morning.     Historical Provider, MD   There were no vitals taken for this visit. Physical Exam  Nursing note and vitals reviewed. Constitutional: He is oriented to person, place, and time. He appears well-developed and well-nourished. No distress.  HENT:  Head: Normocephalic and atraumatic.  Eyes: EOM are normal.  Neck: Neck supple. No tracheal deviation present.  Cardiovascular: Normal rate.   Pulmonary/Chest: Effort normal. No respiratory distress.  Musculoskeletal: Normal range of motion.  Neurological: He is alert and oriented to person, place, and time.  Skin: Skin is warm and dry. Laceration noted.  E shaped laceration to the scalp:9cm in length  Psychiatric: He has a normal mood and affect. His behavior is normal.    ED Course  LACERATION REPAIR Date/Time: 12/04/2013 5:46 PM Performed by: Teressa Lower Authorized by: Teressa Lower Consent: Verbal consent obtained. Risks and benefits: risks, benefits and alternatives were discussed Consent given by: patient Patient identity confirmed: verbally with patient Time out: Immediately prior to procedure a "time out" was called to verify the correct patient, procedure, equipment, support staff and site/side marked as required. Body area: head/neck Location details: scalp Laceration length: 9 cm Foreign bodies: no foreign bodies Anesthesia: local infiltration Local anesthetic: lidocaine 1% with epinephrine Irrigation solution: saline Irrigation method: tap Amount of cleaning: extensive Skin closure: staples Approximation: close Approximation difficulty: simple Comments: 9 staples placed   (including critical care time) Labs Review Labs Reviewed - No data to display  Imaging Review Ct Head Wo Contrast  12/04/2013   CLINICAL DATA:  Head laceration.  EXAM: CT HEAD WITHOUT CONTRAST  TECHNIQUE: Contiguous axial images were obtained from the base of the skull through the vertex without intravenous contrast.  COMPARISON:  None.   FINDINGS: The ventricles and sulci are within normal limits for age. There is no evidence of acute infarct, intracranial hemorrhage, mass, midline shift, or extra-axial collection. The orbits are unremarkable. The visualized paranasal sinuses and mastoid air cells are clear. There is no evidence of acute fracture. Moderate intracranial atherosclerotic calcification is noted of the vertebral arteries and carotid siphons.  Left frontal scalp laceration is present. No radiopaque foreign body is identified.  IMPRESSION: 1. Left frontal scalp laceration. 2. Unremarkable appearance of the brain for age. No acute intracranial abnormality identified.   Electronically Signed   By: Sebastian Ache   On: 12/04/2013 17:15     EKG Interpretation None      MDM   Final diagnoses:  Laceration of head   Wound repaired. Pt vagal during the episode.  Was able to ambulate without any problem after the area was closed  I personally performed the services described in this documentation, which was scribed in my presence. The recorded information has been reviewed and is accurate.    Teressa Lower, NP 12/04/13 1746  Teressa Lower, NP 12/04/13 1749

## 2013-12-04 NOTE — ED Notes (Signed)
Pt had syncopal episode while cleaning wound. PA at bedside. Pt recovered spontaneously.

## 2013-12-04 NOTE — ED Notes (Signed)
Pt reports mowing lawn today, clipped tree branch with head which caused laceration. Pt's hair plastered over injury, bleeding stopped at this time. No LOC or blood thinners.

## 2013-12-04 NOTE — ED Notes (Signed)
PA at bedside to staple head wound.

## 2013-12-04 NOTE — ED Provider Notes (Signed)
Medical screening examination/treatment/procedure(s) were performed by non-physician practitioner and as supervising physician I was immediately available for consultation/collaboration.   Sayyid Harewood T Sheresa Cullop, MD 12/04/13 2234 

## 2015-09-12 ENCOUNTER — Encounter: Payer: Self-pay | Admitting: Physician Assistant

## 2015-09-12 ENCOUNTER — Ambulatory Visit (INDEPENDENT_AMBULATORY_CARE_PROVIDER_SITE_OTHER): Payer: Medicare Other | Admitting: Cardiology

## 2015-09-12 ENCOUNTER — Encounter: Payer: Self-pay | Admitting: Cardiology

## 2015-09-12 VITALS — BP 118/80 | HR 93 | Ht 66.0 in | Wt 217.8 lb

## 2015-09-12 DIAGNOSIS — R079 Chest pain, unspecified: Secondary | ICD-10-CM

## 2015-09-12 DIAGNOSIS — E785 Hyperlipidemia, unspecified: Secondary | ICD-10-CM

## 2015-09-12 DIAGNOSIS — I25118 Atherosclerotic heart disease of native coronary artery with other forms of angina pectoris: Secondary | ICD-10-CM

## 2015-09-12 DIAGNOSIS — I2510 Atherosclerotic heart disease of native coronary artery without angina pectoris: Secondary | ICD-10-CM | POA: Insufficient documentation

## 2015-09-12 HISTORY — DX: Atherosclerotic heart disease of native coronary artery without angina pectoris: I25.10

## 2015-09-12 MED ORDER — PRAVASTATIN SODIUM 20 MG PO TABS: 20.0000 mg | ORAL_TABLET | Freq: Every evening | ORAL | Status: DC

## 2015-09-12 NOTE — Patient Instructions (Addendum)
Medication Instructions:  Your physician has recommended you make the following change in your medication:  1) STOP CRESTOR 2) START PRAVACHOL 20 mg daily  Labwork: Your physician recommends that you return for FASTING lab work in: 6 WEEKS  Testing/Procedures: .Dr. Mayford Knife recommends you have a NUCLEAR STRESS TEST.  Follow-Up: Your physician wants you to follow-up in: 1 year with Dr. Mayford Knife. You will receive a reminder letter in the mail two months in advance. If you don't receive a letter, please call our office to schedule the follow-up appointment.   Any Other Special Instructions Will Be Listed Below (If Applicable).     If you need a refill on your cardiac medications before your next appointment, please call your pharmacy.

## 2015-09-12 NOTE — Progress Notes (Signed)
Cardiology Office Note   Date:  09/12/2015   ID:  [name] [name], [name] [name] 1945-08-31, MRN [id number]  PCP:  [name] Dubonnet, MD    Chief Complaint  Patient presents with  . Chest Pain  . Hypertension      History of Present Illness: [name] [name] is a 70 y.o. male who presents for evaluation of chest pain.  He was seen by his PCP last week for evaluation of body cramps which were though to possibly be due to his statin therapy.  His statin dose had been decreased with some improvement in his body aches but they were still present.  He mentioned that he had been having CP  with exertion.  This has been going on for about 6 months.  He has also noticed that he wears out when working out in the yard.  He gets it when he is walking like playing golf and it tends to go away with rest.  It is right sided and radiates into the substernal location.  It is a heaviness and worsens when he gets emotionally upset. He has never had it with rest.   He also gets DOE but mainly this is when he walks up stairs but not walking the dog.  He has a history of HTN, dyslipidemia, nonobstructive CAD on cath in 1996 (60% LAD and 60-70% D1), GERD with esophageal stricture.      Past Medical History  Diagnosis Date  . History of kidney stones   . Hypertension     takes Norvasc and Micardis daily  . Hyperlipidemia     takes Crestor daily  . Pneumonia     history of double 30+yrs ago  . History of seasonal allergies     takes Loratadine daily  . Joint pain   . Chronic back pain     herniated disc  . GERD (gastroesophageal reflux disease)     takes Omeprazole daily  . Diverticulosis   . Nocturia   . Muscle cramps     takes KDUr and Mag Ox as a trial to see if it will help  . History of gout     takes Allopurinol daily  . Chronic kidney disease     Kidney stone  . CAD (coronary artery disease), native coronary artery 09/12/2015    Past Surgical History  Procedure Laterality Date    . Cystoscopy  1993  . Foot surgery  41yrs ago    right-removed bone spur   . Colonoscopy    . Esophagogastroduodenoscopy      with diliation  . Lithotripsy      x 2  . Vasectomy  13yrs ago  . Lumbar laminectomy  03-07-2012    with fusion  . Cystoscopy with ureteroscopy  07/28/2012    Procedure: CYSTOSCOPY WITH URETEROSCOPY;  Surgeon: [name] Mc, MD;  Location: [facility];  Service: Urology;  Laterality: Left;     . Holmium laser application  07/28/2012    Procedure: HOLMIUM LASER APPLICATION;  Surgeon: [name] Mc, MD;  Location: [facility];  Service: Urology;  Laterality: Left;  left lithotripsy     Current Outpatient Prescriptions  Medication Sig Dispense Refill  . allopurinol (ZYLOPRIM) 100 MG tablet Take 100 mg by mouth every morning.     [name] amLODipine (NORVASC) 5 MG tablet Take 5 mg by mouth every morning.     [name] aspirin EC 81 MG  tablet Take 81 mg by mouth daily.    . cholecalciferol (VITAMIN D) 1000 UNITS tablet Take 1,000 Units by mouth daily.    . fexofenadine (ALLEGRA) 180 MG tablet Take 180 mg by mouth daily as needed. (allergies)    . loratadine (CLARITIN) 10 MG tablet Take 10 mg by mouth daily.    . Multiple Vitamin (MULTIVITAMIN WITH MINERALS) TABS Take 1 tablet by mouth daily.    . nitroGLYCERIN (NITROSTAT) 0.4 MG SL tablet Place 0.4 mg under the tongue every 5 (five) minutes x 3 doses as needed. (chest pain)  0  . Omega-3 Fatty Acids (FISH OIL) 1200 MG CAPS Take 1 capsule by mouth daily.    [name] omeprazole (PRILOSEC) 20 MG capsule Take 20 mg by mouth daily.    [name] OVER THE COUNTER MEDICATION Take 1 capsule by mouth daily. 200 mg pill daily of Co Q 10    . Tamsulosin HCl (FLOMAX) 0.4 MG CAPS Take 0.4 mg by mouth daily after supper.    . telmisartan (MICARDIS) 80 MG tablet Take 80 mg by mouth every morning.    . pravastatin (PRAVACHOL) 20 MG tablet Take 1 tablet (20 mg total) by mouth every evening. 30 tablet 11   No current facility-administered medications for this visit.     Allergies:   Atorvastatin; Hctz; and Rosuvastatin    Social History:  The patient  reports that he has never smoked. He has never used smokeless tobacco. He reports that he does not drink alcohol or use illicit drugs.   Family History:  The patient's family history includes Dementia in his mother; Prostate cancer in his father.    ROS:  Please see the history of present illness.   Otherwise, review of systems are positive for none.   All other systems are reviewed and negative.    PHYSICAL EXAM: VS:  BP 118/80 mmHg  Pulse 93  Ht  (1.676 m)  Wt 217 lb 12.8 oz (98.793 kg)  BMI 35.17 kg/m2  SpO2 94% , BMI Body mass index is 35.17 kg/(m^2). GEN: Well nourished, well developed, in no acute distress HEENT: normal Neck: no JVD, carotid bruits, or masses Cardiac: RRR; no murmurs, rubs, or gallops,no edema  Respiratory:  clear to auscultation bilaterally, normal work of breathing GI: soft, nontender, nondistended, + BS MS: no deformity or atrophy Skin: warm and dry, no rash Neuro:  Strength and sensation are intact Psych: euthymic mood, full affect   EKG:  EKG is ordered today. The ekg ordered today demonstrates NSR at 80bpm with no ST changes   Recent Labs: No results found for requested labs within last 365 days.    Lipid Panel No results found for: CHOL, TRIG, HDL, CHOLHDL, VLDL, LDLCALC, LDLDIRECT    Wt Readings from Last 3 Encounters:  09/12/15 217 lb 12.8 oz (98.793 kg)  07/21/12 209 lb (94.802 kg)  05/29/12 202 lb 6 oz (91.797 kg)     ASSESSMENT AND PLAN:  1.  Chest pain that is very worrisome for exertional angina pectoris.  He has a history of nonobstructive ASCAD by cath in 1996 with LAD 60% and 60-70% D1.  He is having discomfort with exertion playing golf but not when walking his dog.  EKG is normal.  I will get a stress myoview to rule out ischemia.   2.  ASCAD with nonobstructive disease by cath in 1996.  Continue ASA/statin 3.  Hyperlipidemia -  continue low dose statin.  He has had some cramping  but minimal.  Intolerant to lipitor (cramps and dizziness) and on low dose crestor (cramps).  He is now off of the crestor and the cramps have resolved. I will try Pravachol  daily and recheck FLP and ALT in 6 weeks.  If he does not tolerate this then will refer to lipid clinic.   4.  HTN - controlled on current medical regimen.  Continue amlodipine   Current medicines are reviewed at length with the patient today.  The patient does not have concerns regarding medicines.  The following changes have been made:  no change  Labs/ tests ordered today: See above Assessment and Plan  Orders Placed This Encounter  Procedures  . Hepatic function panel  . Lipid panel  . Myocardial Perfusion Imaging  . EKG 12-Lead     Disposition:   FU with me in 1 year if nuclear stress test ok  Signed, Quintella Reichert, MD  09/12/2015 3:01 PM    [facility] [address] [place], [place], [place]  [address] Phone: [phone]; Fax: [phone]

## 2015-09-12 NOTE — Progress Notes (Deleted)
Cardiology Office Note   Date:  09/12/2015   ID:  Zyren, Sevigny [birth year], MRN [id number]  PCP:  Pearla Dubonnet, MD  Cardiologist:  Georgena Spurling, PA-C   No chief complaint on file.   History of Present Illness: RAHM MINIX is a 70 y.o. male with a history of HTN, HLD, GERD, ?OA, CKD III, urolithiasis  WINNER VALERIANO presents for ***   Past Medical History  Diagnosis Date  . History of kidney stones   . Hypertension     takes Norvasc and Micardis daily  . Hyperlipidemia     takes Crestor daily  . Pneumonia     history of double 30+yrs ago  . History of seasonal allergies     takes Loratadine daily  . Joint pain   . Chronic back pain     herniated disc  . GERD (gastroesophageal reflux disease)     takes Omeprazole daily  . Diverticulosis   . Nocturia   . Muscle cramps     takes KDUr and Mag Ox as a trial to see if it will help  . History of gout     takes Allopurinol daily  . Chronic kidney disease     Kidney stone    Past Surgical History  Procedure Laterality Date  . Cystoscopy  1993  . Foot surgery  8yrs ago    right-removed bone spur   . Colonoscopy    . Esophagogastroduodenoscopy      with diliation  . Lithotripsy      x 2  . Vasectomy  41yrs ago  . Back surgery  03-07-2012  . Cystoscopy with ureteroscopy  07/28/2012    Procedure: CYSTOSCOPY WITH URETEROSCOPY;  Surgeon: [name], MD;  Location: [facility];  Service: Urology;  Laterality: Left;   C-ARM    . Holmium laser application  07/28/2012    Procedure: HOLMIUM LASER APPLICATION;  Surgeon: [name], MD;  Location: [facility];  Service: Urology;  Laterality: Left;  left lithotripsy    Current Outpatient Prescriptions  Medication Sig Dispense Refill  . allopurinol (ZYLOPRIM) 100 MG tablet Take 100 mg by mouth every morning.     [name] amLODipine (NORVASC) 5 MG tablet Take 5 mg by mouth every morning.     [name] aspirin EC 81 MG tablet Take 81 mg by mouth daily.    . cholecalciferol  (VITAMIN D) 1000 UNITS tablet Take 1,000 Units by mouth daily.    [name] loratadine (CLARITIN) 10 MG tablet Take 10 mg by mouth daily.    . Multiple Vitamin (MULTIVITAMIN WITH MINERALS) TABS Take 1 tablet by mouth daily.    . Omega-3 Fatty Acids (FISH OIL) 1200 MG CAPS Take 1 capsule by mouth daily.    [name] omeprazole (PRILOSEC) 20 MG capsule Take 20 mg by mouth daily.    [name] OVER THE COUNTER MEDICATION Take 1 capsule by mouth daily. 200 mg pill daily of Co Q 10    . rosuvastatin (CRESTOR) 5 MG tablet Take 5 mg by mouth at bedtime.    . Tamsulosin HCl (FLOMAX) 0.4 MG CAPS Take 0.4 mg by mouth daily after supper.    . telmisartan (MICARDIS) 80 MG tablet Take 80 mg by mouth every morning.     No current facility-administered medications for this visit.    Allergies:   Review of patient's allergies indicates no known allergies.    Social History:  The patient  reports that he has never  smoked. He has never used smokeless tobacco. He reports that he does not drink alcohol or use illicit drugs.   Family History:  The patient's family history is not on file.    ROS:  Please see the history of present illness. All other systems are reviewed and negative.    PHYSICAL EXAM: VS:  There were no vitals taken for this visit. , BMI There is no weight on file to calculate BMI. GEN: Well nourished, well developed, male in no acute distress HEENT: normal for age  Neck: no JVD, no carotid bruit, no masses Cardiac: RRR; no murmur, no rubs, or gallops Respiratory:  clear to auscultation bilaterally, normal work of breathing GI: soft, nontender, nondistended, + BS MS: no deformity or atrophy; no edema; distal pulses are 2+ in all 4 extremities  Skin: warm and dry, no rash Neuro:  Strength and sensation are intact Psych: euthymic mood, full affect   EKG:  EKG {ACTION; IS/IS ZOX:[id number]} ordered today. The ekg ordered today demonstrates ***   Recent Labs: No results found for requested labs within last  365 days.    Lipid Panel No results found for: CHOL, TRIG, HDL, CHOLHDL, VLDL, LDLCALC, LDLDIRECT   Wt Readings from Last 3 Encounters:  07/21/12 209 lb (94.802 kg)  05/29/12 202 lb 6 oz (91.797 kg)  03/07/12 221 lb 9 oz (100.5 kg)     Other studies Reviewed: Additional studies/ records that were reviewed today include: ***.  ASSESSMENT AND PLAN:  1.  ***   Current medicines are reviewed at length with the patient today.  The patient {ACTIONS; HAS/DOES NOT HAVE:19233} concerns regarding medicines.  The following changes have been made:  {PLAN; NO CHANGE:13088:s}  Labs/ tests ordered today include: *** No orders of the defined types were placed in this encounter.     Disposition:   FU with ***  Signed, Leanna Battles  09/12/2015 7:42 AM    [facility] Phone: [phone]; Fax: [phone]  This note was written with the assistance of speech recognition software. Please excuse any transcriptional errors.

## 2015-09-22 ENCOUNTER — Telehealth (HOSPITAL_COMMUNITY): Payer: Self-pay | Admitting: Radiology

## 2015-09-22 ENCOUNTER — Telehealth (HOSPITAL_COMMUNITY): Payer: Self-pay

## 2015-09-22 NOTE — Telephone Encounter (Signed)
Left message on voicemail in reference to upcoming appointment scheduled for 09/23/2015. Phone number given for a call back so details instructions can be given. S.Shoshanah Dapper EMTP

## 2015-09-22 NOTE — Telephone Encounter (Signed)
Patient given detailed instructions per Myocardial Perfusion Study Information Sheet for the test on 09/23/2015 at 11:00. Patient notified to arrive 15 minutes early and that it is imperative to arrive on time for appointment to keep from having the test rescheduled.  If you need to cancel or reschedule your appointment, please call the office within 24 hours of your appointment. Failure to do so may result in a cancellation of your appointment, and a $50 no show fee. Patient verbalized understanding.EHK

## 2015-09-23 ENCOUNTER — Ambulatory Visit (HOSPITAL_COMMUNITY): Payer: Medicare Other | Attending: Cardiology

## 2015-09-23 DIAGNOSIS — R079 Chest pain, unspecified: Secondary | ICD-10-CM | POA: Insufficient documentation

## 2015-09-23 DIAGNOSIS — R0609 Other forms of dyspnea: Secondary | ICD-10-CM | POA: Diagnosis not present

## 2015-09-23 DIAGNOSIS — I10 Essential (primary) hypertension: Secondary | ICD-10-CM | POA: Insufficient documentation

## 2015-09-23 LAB — MYOCARDIAL PERFUSION IMAGING
CHL CUP NUCLEAR SDS: 1
CHL CUP RESTING HR STRESS: 77 {beats}/min
CSEPPHR: 115 {beats}/min
LHR: 0.32
LVDIAVOL: 93 mL (ref 62–150)
LVSYSVOL: 43 mL
SRS: 6
SSS: 7
TID: 1

## 2015-09-23 MED ORDER — TECHNETIUM TC 99M SESTAMIBI GENERIC - CARDIOLITE
31.3000 | Freq: Once | INTRAVENOUS | Status: AC | PRN
  Administered 2015-09-23: 31.3 via INTRAVENOUS

## 2015-09-23 MED ORDER — TECHNETIUM TC 99M SESTAMIBI GENERIC - CARDIOLITE
10.2000 | Freq: Once | INTRAVENOUS | Status: AC | PRN
  Administered 2015-09-23: 10 via INTRAVENOUS

## 2015-09-23 MED ORDER — REGADENOSON 0.4 MG/5ML IV SOLN
0.4000 mg | Freq: Once | INTRAVENOUS | Status: AC
  Administered 2015-09-23: 0.4 mg via INTRAVENOUS

## 2015-09-28 ENCOUNTER — Telehealth: Payer: Self-pay

## 2015-09-28 DIAGNOSIS — R079 Chest pain, unspecified: Secondary | ICD-10-CM

## 2015-09-28 NOTE — Telephone Encounter (Signed)
-----   Message from Quintella Reichertraci R Turner, MD sent at 09/24/2015  5:07 PM EST ----- Please get coronary CTA with morph to evaluate for CAD

## 2015-09-28 NOTE — Telephone Encounter (Signed)
Coronary CT ordered for scheduling. Patient agrees with treatment plan. 

## 2015-10-04 ENCOUNTER — Encounter: Payer: Self-pay | Admitting: Cardiology

## 2015-10-04 NOTE — Addendum Note (Signed)
Addended by: Gunnar FusiKEMP, Denicia Pagliarulo A on: 10/04/2015 12:27 PM   Modules accepted: Orders

## 2015-10-04 NOTE — Telephone Encounter (Signed)
Coronary CT scheduled 4/4.

## 2015-10-14 ENCOUNTER — Other Ambulatory Visit (INDEPENDENT_AMBULATORY_CARE_PROVIDER_SITE_OTHER): Payer: Medicare Other | Admitting: *Deleted

## 2015-10-14 DIAGNOSIS — R079 Chest pain, unspecified: Secondary | ICD-10-CM

## 2015-10-14 DIAGNOSIS — E785 Hyperlipidemia, unspecified: Secondary | ICD-10-CM | POA: Diagnosis not present

## 2015-10-14 LAB — LIPID PANEL
CHOL/HDL RATIO: 4.6 ratio (ref <=5.0)
CHOLESTEROL: 205 mg/dL — AB (ref 125–200)
HDL: 45 mg/dL (ref >=40)
LDL Cholesterol: 125 mg/dL (ref <130)
TRIGLYCERIDES: 175 mg/dL — AB (ref <150)
VLDL: 35 mg/dL — ABNORMAL HIGH (ref <30)

## 2015-10-14 LAB — BASIC METABOLIC PANEL
BUN: 22 mg/dL (ref 7–25)
CALCIUM: 9.1 mg/dL (ref 8.6–10.3)
CO2: 25 mmol/L (ref 20–31)
CREATININE: 1.68 mg/dL — AB (ref 0.70–1.25)
Chloride: 105 mmol/L (ref 98–110)
GLUCOSE: 95 mg/dL (ref 65–99)
Potassium: 4.1 mmol/L (ref 3.5–5.3)
Sodium: 141 mmol/L (ref 135–146)

## 2015-10-14 LAB — HEPATIC FUNCTION PANEL
ALBUMIN: 4.3 g/dL (ref 3.6–5.1)
ALT: 37 U/L (ref 9–46)
AST: 26 U/L (ref 10–35)
Alkaline Phosphatase: 82 U/L (ref 40–115)
BILIRUBIN TOTAL: 0.5 mg/dL (ref 0.2–1.2)
Bilirubin, Direct: 0.1 mg/dL (ref <=0.2)
Indirect Bilirubin: 0.4 mg/dL (ref 0.2–1.2)
TOTAL PROTEIN: 7 g/dL (ref 6.1–8.1)

## 2015-10-18 ENCOUNTER — Encounter (HOSPITAL_COMMUNITY): Payer: Self-pay

## 2015-10-18 ENCOUNTER — Ambulatory Visit (HOSPITAL_COMMUNITY)
Admission: RE | Admit: 2015-10-18 | Discharge: 2015-10-18 | Disposition: A | Discharge status: Home / Self Care | Payer: Medicare Other | Source: Ambulatory Visit | Attending: Cardiology | Admitting: Cardiology

## 2015-10-18 DIAGNOSIS — R079 Chest pain, unspecified: Secondary | ICD-10-CM | POA: Diagnosis not present

## 2015-10-18 DIAGNOSIS — I2510 Atherosclerotic heart disease of native coronary artery without angina pectoris: Secondary | ICD-10-CM | POA: Diagnosis not present

## 2015-10-18 MED ORDER — NITROGLYCERIN 0.4 MG SL SUBL
0.8000 mg | SUBLINGUAL_TABLET | Freq: Once | SUBLINGUAL | Status: AC
  Administered 2015-10-18: 0.8 mg via SUBLINGUAL
  Filled 2015-10-18: qty 25

## 2015-10-18 MED ORDER — METOPROLOL TARTRATE 1 MG/ML IV SOLN
5.0000 mg | INTRAVENOUS | Status: DC | PRN
  Administered 2015-10-18 (×2): 5 mg via INTRAVENOUS
  Filled 2015-10-18: qty 5

## 2015-10-18 MED ORDER — IOPAMIDOL (ISOVUE-370) INJECTION 76%
INTRAVENOUS | Status: AC
  Administered 2015-10-18: 100 mL
  Filled 2015-10-18: qty 100

## 2015-10-18 MED ORDER — NITROGLYCERIN 0.4 MG SL SUBL
SUBLINGUAL_TABLET | SUBLINGUAL | Status: AC
  Administered 2015-10-18: 0.8 mg via SUBLINGUAL
  Filled 2015-10-18: qty 2

## 2015-10-18 MED ORDER — METOPROLOL TARTRATE 1 MG/ML IV SOLN
INTRAVENOUS | Status: AC
  Administered 2015-10-18: 5 mg via INTRAVENOUS
  Filled 2015-10-18: qty 10

## 2015-10-19 ENCOUNTER — Telehealth: Payer: Self-pay

## 2015-10-19 DIAGNOSIS — K219 Gastro-esophageal reflux disease without esophagitis: Secondary | ICD-10-CM

## 2015-10-19 MED ORDER — PANTOPRAZOLE SODIUM 40 MG PO TBEC: 40.0000 mg | DELAYED_RELEASE_TABLET | Freq: Every day | ORAL | Status: DC

## 2015-10-19 NOTE — Telephone Encounter (Signed)
Notes Recorded by Quintella Reichertraci R Turner, MD on 10/19/2015 at 2:15 PM Markedly elevated calcium score with at least 50-70% ostial RCA and diffuse plaque in prox and mid RCA up to 50% as well as PDA, >70% prox LAD with severe calcification, moderate mixed plaque in the pox LCX/OM up to 50-70%. Study limited by motion artifact and patient size but most likely significant CAD with balanced ischemia on nuclear stress test resulting in balanced ischemia. Please set patient up for left heart cath> He will need H&P with extender prior to cath. Notes Recorded by Quintella Reichertraci R Turner, MD on 10/18/2015 at 11:15 AM Distal esophageal wall thickening can be seen with gastroesophageal reflux disease. Coronary portion of study pending. Please set patient up with [facility] for further evaluation and change prilosec to Protonix 40mg  daily.    Informed patient of results and verbal understanding expressed.  Referral for GI placed. Instructed patient to STOP PRILOSEC and START PROTONIX 40 mg daily. Scheduled patient 4/11 with Nada BoozerLaura Ingold to discuss and set up catheterization. Patient agrees with treatment plan and was grateful for treatment plan.

## 2015-10-20 ENCOUNTER — Telehealth: Payer: Self-pay

## 2015-10-20 DIAGNOSIS — E785 Hyperlipidemia, unspecified: Secondary | ICD-10-CM

## 2015-10-20 MED ORDER — EZETIMIBE 10 MG PO TABS: 10.0000 mg | ORAL_TABLET | Freq: Every day | ORAL | Status: DC

## 2015-10-20 NOTE — Telephone Encounter (Signed)
-----   Message from Evie LacksSally R Earl, [facility] sent at 10/20/2015  2:39 PM EDT ----- Would start patient on Zetia 10mg  daily and continue pravachol.  If he is experiencing extreme cramps, can try 1/2 dose of pravachol.  Recheck labs in 2 months.  If LDL still elevated, will qualify for PCSK-9 inhibitor therapy

## 2015-10-20 NOTE — Telephone Encounter (Signed)
Patient agrees to continue current dose of Pravachol and to call if he cannot tolerate it any longer. Instructed patient to START ZETIA 10 mg daily. LFTs, FLP scheduled June 5. Patient agrees with treatment plan.

## 2015-10-24 ENCOUNTER — Other Ambulatory Visit: Payer: Medicare Other

## 2015-10-24 NOTE — Progress Notes (Signed)
 Cardiology Office Note   Date:  10/25/2015   ID:  [name] [name], DOB 12/29/1945, MRN [id number]  PCP:  GATES,ROBERT NEVILL, MD  Cardiologist:  Dr. Turner   Chief Complaint  Patient presents with  . Pre-op Exam    3 vessel disease and angina      History of Present Illness: [name] [name] is a 70 y.o. male who presents for results of nuc study and cardiac CT and to discuss cardiac cath per Dr. Turner.    He was seen by his PCP last week for evaluation of body cramps which were though to possibly be due to his statin therapy. His statin dose had been decreased with some improvement in his body aches but they were still present. He mentioned that he had been having CP with exertion. This has been going on for about 6 months. He has also noticed that he wears out when working out in the yard. He gets it when he is walking like playing golf and it tends to go away with rest. It is right sided and radiates into the substernal location. It is a heaviness and worsens when he gets emotionally upset. He has never had it with rest. He also gets DOE but mainly this is when he walks up stairs but not walking the dog. He has a history of HTN, dyslipidemia, nonobstructive CAD on cath in 1996 (60% LAD and 60-70% D1), GERD with esophageal stricture.  Pt had myoview and was negative for ischemia but continued with chest pain, cardiac CT with coronary calcium score 1951 and severe > 70 % stenosis in prox LAD.  Possible significant disease in the ostial RCA and LCX.  Possible balanced ischemia on nuc.  Also with thickening of distal esophageal wall, pt to see GI Dr. Schuler 11/18/15.  Dr. Turner has recommended cardia cath for possible balanced ischamia on stress test and 3 vessel high risk CAD on cardiac CT.    Pt had one episode of chest pressure on Sunday at rest and took NTG with relief.  No other episodes.  He is on ASA and statin, will add BB metoprolol 25 mg BID.  He has had contrast without  problems in the past. No allergy to contrast or shellfish. .    Past Medical History  Diagnosis Date  . History of kidney stones   . Hypertension     takes Norvasc and Micardis daily  . Hyperlipidemia     takes Crestor daily  . Pneumonia     history of double 30+yrs ago  . History of seasonal allergies     takes Loratadine daily  . Joint pain   . Chronic back pain     herniated disc  . GERD (gastroesophageal reflux disease)     takes Omeprazole daily  . Diverticulosis   . Nocturia   . Muscle cramps     takes KDUr and Mag Ox as a trial to see if it will help  . History of gout     takes Allopurinol daily  . Chronic kidney disease     Kidney stone  . CAD (coronary artery disease), native coronary artery 09/12/2015    Past Surgical History  Procedure Laterality Date  . Cystoscopy  1993  . Foot surgery  25yrs ago    right-removed bone spur   . Colonoscopy    . Esophagogastroduodenoscopy      with diliation  . Lithotripsy      x 2  .   Vasectomy  30yrs ago  . Lumbar laminectomy  03-07-2012    with fusion  . Cystoscopy with ureteroscopy  07/28/2012    Procedure: CYSTOSCOPY WITH URETEROSCOPY;  Surgeon: Les Borden, MD;  Location: [facility];  Service: Urology;  Laterality: Left;     . Holmium laser application  07/28/2012    Procedure: HOLMIUM LASER APPLICATION;  Surgeon: Les Borden, MD;  Location: [facility];  Service: Urology;  Laterality: Left;  left lithotripsy     Current Outpatient Prescriptions  Medication Sig Dispense Refill  . allopurinol (ZYLOPRIM) 100 MG tablet Take 100 mg by mouth every morning.     . amLODipine (NORVASC) 5 MG tablet Take 5 mg by mouth every morning.     . aspirin EC 81 MG tablet Take 81 mg by mouth daily.    . cholecalciferol (VITAMIN D) 1000 UNITS tablet Take 1,000 Units by mouth daily.    . Coenzyme Q10 (COQ10) 100 MG CAPS Take 1 capsule by mouth daily.    . ezetimibe (ZETIA) 10 MG tablet Take 1 tablet (10 mg total) by mouth daily. 30 tablet 11  .  fexofenadine (ALLEGRA) 180 MG tablet Take 180 mg by mouth daily as needed. (allergies)    . loratadine (CLARITIN) 10 MG tablet Take 10 mg by mouth daily.    . Multiple Vitamin (MULTIVITAMIN WITH MINERALS) TABS Take 1 tablet by mouth daily.    . nitroGLYCERIN (NITROSTAT) 0.4 MG SL tablet Place 0.4 mg under the tongue every 5 (five) minutes x 3 doses as needed. (chest pain)  0  . Omega-3 Fatty Acids (FISH OIL) 1200 MG CAPS Take 1 capsule by mouth daily.    . OVER THE COUNTER MEDICATION Take 1 capsule by mouth daily. 200 mg pill daily of Co Q 10    . pantoprazole (PROTONIX) 40 MG tablet Take 1 tablet (40 mg total) by mouth daily. 30 tablet 11  . pravastatin (PRAVACHOL) 20 MG tablet Take 1 tablet (20 mg total) by mouth every evening. 30 tablet 11  . Tamsulosin HCl (FLOMAX) 0.4 MG CAPS Take 0.4 mg by mouth daily after supper.    . telmisartan (MICARDIS) 80 MG tablet Take 80 mg by mouth every morning.     No current facility-administered medications for this visit.    Allergies:   Atorvastatin; Hctz; and Rosuvastatin    Social History:  The patient  reports that he has never smoked. He has never used smokeless tobacco. He reports that he does not drink alcohol or use illicit drugs.   Family History:  The patient's family history includes Dementia in his mother; Prostate cancer in his father.    ROS:  General:no colds or fevers, no weight changes Skin:no rashes or ulcers HEENT:no blurred vision, no congestion CV:see HPI PUL:see HPI GI:no diarrhea constipation or melena, no indigestion GU:no hematuria, no dysuria MS:no joint pain, no claudication Neuro:no syncope, no lightheadedness Endo:no diabetes, no thyroid disease  Wt Readings from Last 3 Encounters:  10/25/15 217 lb 12.8 oz (98.793 kg)  09/12/15 217 lb 12.8 oz (98.793 kg)  07/21/12 209 lb (94.802 kg)     PHYSICAL EXAM: VS:  BP 115/82 mmHg  Pulse 88  Ht 5' 6" (1.676 m)  Wt 217 lb 12.8 oz (98.793 kg)  BMI 35.17 kg/m2  SpO2  99% , BMI Body mass index is 35.17 kg/(m^2). General:Pleasant affect, NAD Skin:Warm and dry, brisk capillary refill HEENT:normocephalic, sclera clear, mucus membranes moist Neck:supple, no JVD, no bruits  Heart:S1S2 RRR without murmur,   gallup, rub or click Lungs:clear without rales, rhonchi, or wheezes Abd:soft, non tender, + BS, do not palpate liver spleen or masses Ext:no lower ext edema, 2+ pedal pulses, 2+ radial pulses Neuro:alert and oriented, MAE, follows commands, + facial symmetry    EKG:  EKG is NOT ordered today.    Recent Labs: 10/14/2015: ALT 37; BUN 22; Creat 1.68*; Potassium 4.1; Sodium 141    Lipid Panel    Component Value Date/Time   CHOL 205* 10/14/2015 0839   TRIG 175* 10/14/2015 0839   HDL 45 10/14/2015 0839   CHOLHDL 4.6 10/14/2015 0839   VLDL 35* 10/14/2015 0839   LDLCALC 125 10/14/2015 0839       Other studies Reviewed: Additional studies/ records that were reviewed today include:  . NUC study: Overall Study Impression Myocardial perfusion is normal. The study is normal. This is a low risk study. Overall left ventricular systolic function was normal. LV cavity size is normal. Nuclear stress EF: 54%. The left ventricular ejection fraction is mildly decreased (45-54%). There is no prior study for comparison  Cardiac CT: IMPRESSION: 1. Coronary calcium score of 1951. This was 97 percentile for age and sex matched control. 2. Normal coronary origin with right dominance. 3. This study is significantly affected by motion and patient size. However, there is severe > 70 % stenosis in the proximal LAD. There is suspicion for a significant disease in the ostial RCA and LCX arteries. Cardiac cath is recommended (possibly balance ischemia on nuclear scan).  Radiology over read: FINDINGS: Mild bibasilar pulmonary parenchymal volume loss. Distal esophageal wall thickening can be seen with gastroesophageal reflux disease. Extracardiac structures are  otherwise unremarkable.  IMPRESSION: Distal esophageal wall thickening can be seen with gastroesophageal reflux disease. ASSESSMENT AND PLAN:  1. Chest pain- angina   He has a history of nonobstructive ASCAD by cath in 1996 with LAD 60% and 60-70% D1. He is having discomfort with exertion playing golf and now at rest with some relief with NTG.   EKG was normal.Lexiscan  myoview was neg for ischemia  But with continued discomfort. He had cardiac CT with results as above, High risk for significant CAD.   Discussed his CAD and several scenarios PCI to CABG.  Or not as significant as seems.    The patient understands that risks included but are not limited to stroke (1 in 1000), death (1 in 1000), kidney failure [usually temporary] (1 in 500), bleeding (1 in 200), allergic reaction [possibly serious] (1 in 200).   Pt agreeable to proceed.    2. ASCAD with nonobstructive disease by cath in 1996. Continue ASA/statin and I added lopressor 25 mg BID.   3. Hyperlipidemia - continue low dose statin. He has had some cramping but minimal. Intolerant to lipitor (cramps and dizziness) and on low dose crestor (cramps). He is now off of the crestor and the cramps have resolved. I will try Pravachol 20mg daily and recheck FLP and ALT in 6 weeks. If he does not tolerate this then will refer to lipid clinic.  4. HTN - controlled on current medical regimen. Continue amlodipine and now BB.   Procedure is scheduled with Dr. Smith on Friday this week he will hold micardis the am of procedure.   All questions answered.     Current medicines are reviewed with the patient today.  The patient Has no concerns regarding medicines.  The following changes have been made:  See above Labs/ tests ordered today include:see above  Disposition:     FU:  see above  Signed, INGOLD,LAURA R, NP  10/25/2015 8:49 AM    [facility] [address], [place], [place]  [address]/ [address] [place], [place] Phone: [phone]; Fax: [phone]     

## 2015-10-25 ENCOUNTER — Ambulatory Visit (INDEPENDENT_AMBULATORY_CARE_PROVIDER_SITE_OTHER): Payer: Medicare Other | Admitting: Cardiology

## 2015-10-25 ENCOUNTER — Encounter: Payer: Self-pay | Admitting: Cardiology

## 2015-10-25 ENCOUNTER — Encounter: Payer: Self-pay | Admitting: *Deleted

## 2015-10-25 VITALS — BP 115/82 | HR 88 | Ht 66.0 in | Wt 217.8 lb

## 2015-10-25 DIAGNOSIS — E785 Hyperlipidemia, unspecified: Secondary | ICD-10-CM

## 2015-10-25 DIAGNOSIS — I25118 Atherosclerotic heart disease of native coronary artery with other forms of angina pectoris: Secondary | ICD-10-CM | POA: Diagnosis not present

## 2015-10-25 DIAGNOSIS — I200 Unstable angina: Secondary | ICD-10-CM

## 2015-10-25 DIAGNOSIS — I10 Essential (primary) hypertension: Secondary | ICD-10-CM

## 2015-10-25 DIAGNOSIS — I208 Other forms of angina pectoris: Secondary | ICD-10-CM | POA: Diagnosis not present

## 2015-10-25 LAB — CBC WITH DIFFERENTIAL/PLATELET
BASOS ABS: 76 {cells}/uL (ref 0–200)
Basophils Relative: 1 %
Eosinophils Absolute: 380 cells/uL (ref 15–500)
Eosinophils Relative: 5 %
HEMATOCRIT: 41.5 % (ref 38.5–50.0)
HEMOGLOBIN: 13.9 g/dL (ref 13.2–17.1)
LYMPHS ABS: 2356 {cells}/uL (ref 850–3900)
LYMPHS PCT: 31 %
MCH: 27.9 pg (ref 27.0–33.0)
MCHC: 33.5 g/dL (ref 32.0–36.0)
MCV: 83.2 fL (ref 80.0–100.0)
MONO ABS: 532 {cells}/uL (ref 200–950)
MPV: 9.4 fL (ref 7.5–12.5)
Monocytes Relative: 7 %
NEUTROS PCT: 56 %
Neutro Abs: 4256 cells/uL (ref 1500–7800)
Platelets: 170 10*3/uL (ref 140–400)
RBC: 4.99 MIL/uL (ref 4.20–5.80)
RDW: 16.7 % — AB (ref 11.0–15.0)
WBC: 7.6 10*3/uL (ref 3.8–10.8)

## 2015-10-25 LAB — BASIC METABOLIC PANEL
BUN: 15 mg/dL (ref 7–25)
CALCIUM: 9.8 mg/dL (ref 8.6–10.3)
CO2: 23 mmol/L (ref 20–31)
Chloride: 105 mmol/L (ref 98–110)
Creat: 1.51 mg/dL — ABNORMAL HIGH (ref 0.70–1.25)
GLUCOSE: 92 mg/dL (ref 65–99)
POTASSIUM: 4.3 mmol/L (ref 3.5–5.3)
SODIUM: 142 mmol/L (ref 135–146)

## 2015-10-25 LAB — PROTIME-INR
INR: 1.01 (ref <1.50)
Prothrombin Time: 13.4 seconds (ref 11.6–15.2)

## 2015-10-25 MED ORDER — METOPROLOL TARTRATE 25 MG PO TABS
25.0000 mg | ORAL_TABLET | Freq: Two times a day (BID) | ORAL | Status: DC
Start: 2015-10-25 — End: 2016-10-31

## 2015-10-25 NOTE — Patient Instructions (Addendum)
Medication Instructions:  Your physician has recommended you make the following change in your medication:  1. START Metoprolol 25 mg taking 1 tablet twice a day   Labwork: TODAY:  BMET, CBC W/DIFF, & PT/INR  Testing/Procedures: Your physician has requested that you have a cardiac catheterization. Cardiac catheterization is used to diagnose and/or treat various heart conditions. Doctors may recommend this procedure for a number of different reasons. The most common reason is to evaluate chest pain. Chest pain can be a symptom of coronary artery disease (CAD), and cardiac catheterization can show whether plaque is narrowing or blocking your heart's arteries. This procedure is also used to evaluate the valves, as well as measure the blood flow and oxygen levels in different parts of your heart. For further information please visit [email or web address] Please follow instruction sheet, as given.   Follow-Up: Your physician recommends that you schedule a follow-up appointment in: WILL BE SET UP AT DISCHARGE AFTER YOUR PROCEDURE   Any Other Special Instructions Will Be Listed Below (If Applicable).  Coronary Angiogram A coronary angiogram, also called coronary angiography, is an X-ray procedure used to look at the arteries in the heart. In this procedure, a dye (contrast dye) is injected through a long, hollow tube (catheter). The catheter is about the size of a piece of cooked spaghetti and is inserted through your groin, wrist, or arm. The dye is injected into each artery, and X-rays are then taken to show if there is a blockage in the arteries of your heart. LET [facility] CARE PROVIDER KNOW ABOUT:  Any allergies you have, including allergies to shellfish or contrast dye.   All medicines you are taking, including vitamins, herbs, eye drops, creams, and over-the-counter medicines.   Previous problems you or members of your family have had with the use of anesthetics.   Any blood disorders  you have.   Previous surgeries you have had.  History of kidney problems or failure.   Other medical conditions you have. RISKS AND COMPLICATIONS  Generally, a coronary angiogram is a safe procedure. However, problems can occur and include:  Allergic reaction to the dye.  Bleeding from the access site or other locations.  Kidney injury, especially in people with impaired kidney function.  Stroke (rare).  Heart attack (rare). BEFORE THE PROCEDURE   Do not eat or drink anything after midnight the night before the procedure or as directed by your health care provider.   Ask your health care provider about changing or stopping your regular medicines. This is especially important if you are taking diabetes medicines or blood thinners. PROCEDURE  You may be given a medicine to help you relax (sedative) before the procedure. This medicine is given through an intravenous (IV) access tube that is inserted into one of your veins.   The area where the catheter will be inserted will be washed and shaved. This is usually done in the groin but may be done in the fold of your arm (near your elbow) or in the wrist.   A medicine will be given to numb the area where the catheter will be inserted (local anesthetic).   The health care provider will insert the catheter into an artery. The catheter will be guided by using a special type of X-ray (fluoroscopy) of the blood vessel being examined.   A special dye will then be injected into the catheter, and X-rays will be taken. The dye will help to show where any narrowing or blockages are located in the  heart arteries.  AFTER THE PROCEDURE   If the procedure is done through the leg, you will be kept in bed lying flat for several hours. You will be instructed to not bend or cross your legs.  The insertion site will be checked frequently.   The pulse in your feet or wrist will be checked frequently.   Additional blood tests, X-rays, and  an electrocardiogram may be done.    This information is not intended to replace advice given to you by your health care provider. Make sure you discuss any questions you have with your health care provider.   Document Released: 01/06/2003 Document Revised: 07/23/2014 Document Reviewed: 11/24/2012 Elsevier Interactive Patient Education [employer]2016 [employer]    If you need a refill on your cardiac medications before your next appointment, please call your pharmacy.

## 2015-10-26 ENCOUNTER — Ambulatory Visit: Payer: Self-pay | Admitting: Interventional Cardiology

## 2015-10-28 ENCOUNTER — Encounter (HOSPITAL_COMMUNITY): Payer: Self-pay | Admitting: Interventional Cardiology

## 2015-10-28 ENCOUNTER — Ambulatory Visit (HOSPITAL_COMMUNITY)
Admission: RE | Admit: 2015-10-28 | Discharge: 2015-10-28 | Disposition: A | Discharge status: Home / Self Care | Payer: Medicare Other | Source: Ambulatory Visit | Attending: Interventional Cardiology | Admitting: Interventional Cardiology

## 2015-10-28 ENCOUNTER — Encounter (HOSPITAL_COMMUNITY)
Admission: RE | Disposition: A | Discharge status: Home / Self Care | Payer: Self-pay | Source: Ambulatory Visit | Attending: Interventional Cardiology

## 2015-10-28 DIAGNOSIS — I209 Angina pectoris, unspecified: Secondary | ICD-10-CM | POA: Diagnosis present

## 2015-10-28 DIAGNOSIS — E785 Hyperlipidemia, unspecified: Secondary | ICD-10-CM | POA: Diagnosis not present

## 2015-10-28 DIAGNOSIS — I25119 Atherosclerotic heart disease of native coronary artery with unspecified angina pectoris: Secondary | ICD-10-CM

## 2015-10-28 DIAGNOSIS — J302 Other seasonal allergic rhinitis: Secondary | ICD-10-CM | POA: Insufficient documentation

## 2015-10-28 DIAGNOSIS — M109 Gout, unspecified: Secondary | ICD-10-CM | POA: Insufficient documentation

## 2015-10-28 DIAGNOSIS — I2584 Coronary atherosclerosis due to calcified coronary lesion: Secondary | ICD-10-CM | POA: Insufficient documentation

## 2015-10-28 DIAGNOSIS — Z7982 Long term (current) use of aspirin: Secondary | ICD-10-CM | POA: Diagnosis not present

## 2015-10-28 DIAGNOSIS — Z79899 Other long term (current) drug therapy: Secondary | ICD-10-CM | POA: Insufficient documentation

## 2015-10-28 DIAGNOSIS — I10 Essential (primary) hypertension: Secondary | ICD-10-CM | POA: Insufficient documentation

## 2015-10-28 DIAGNOSIS — R079 Chest pain, unspecified: Secondary | ICD-10-CM | POA: Diagnosis present

## 2015-10-28 DIAGNOSIS — K219 Gastro-esophageal reflux disease without esophagitis: Secondary | ICD-10-CM | POA: Diagnosis not present

## 2015-10-28 DIAGNOSIS — I2510 Atherosclerotic heart disease of native coronary artery without angina pectoris: Secondary | ICD-10-CM | POA: Diagnosis present

## 2015-10-28 HISTORY — PX: CARDIAC CATHETERIZATION: SHX172

## 2015-10-28 SURGERY — LEFT HEART CATH AND CORONARY ANGIOGRAPHY

## 2015-10-28 MED ORDER — SODIUM CHLORIDE 0.9 % WEIGHT BASED INFUSION: 3.0000 mL/kg/h | INTRAVENOUS | Status: DC

## 2015-10-28 MED ORDER — ASPIRIN 81 MG PO CHEW: 81.0000 mg | CHEWABLE_TABLET | ORAL | Status: DC

## 2015-10-28 MED ORDER — HEPARIN (PORCINE) IN NACL 2-0.9 UNIT/ML-% IJ SOLN
INTRAMUSCULAR | Status: AC
  Filled 2015-10-28: qty 1000

## 2015-10-28 MED ORDER — LIDOCAINE HCL (PF) 1 % IJ SOLN
INTRAMUSCULAR | Status: DC | PRN
Start: 2015-10-28 — End: 2015-10-28
  Administered 2015-10-28: 2 mL via INTRADERMAL

## 2015-10-28 MED ORDER — SODIUM CHLORIDE 0.9 % WEIGHT BASED INFUSION
3.0000 mL/kg/h | INTRAVENOUS | Status: DC
  Administered 2015-10-28: 3 mL/kg/h via INTRAVENOUS

## 2015-10-28 MED ORDER — OXYCODONE-ACETAMINOPHEN 5-325 MG PO TABS: 1.0000 | ORAL_TABLET | ORAL | Status: DC | PRN

## 2015-10-28 MED ORDER — ONDANSETRON HCL 4 MG/2ML IJ SOLN: 4.0000 mg | Freq: Four times a day (QID) | INTRAMUSCULAR | Status: DC | PRN

## 2015-10-28 MED ORDER — SODIUM CHLORIDE 0.9% FLUSH: 3.0000 mL | INTRAVENOUS | Status: DC | PRN

## 2015-10-28 MED ORDER — FENTANYL CITRATE (PF) 100 MCG/2ML IJ SOLN
INTRAMUSCULAR | Status: DC | PRN
  Administered 2015-10-28: 50 ug via INTRAVENOUS

## 2015-10-28 MED ORDER — MIDAZOLAM HCL 2 MG/2ML IJ SOLN
INTRAMUSCULAR | Status: DC | PRN
  Administered 2015-10-28: 1 mg via INTRAVENOUS

## 2015-10-28 MED ORDER — SODIUM CHLORIDE 0.9 % IV SOLN: 250.0000 mL | INTRAVENOUS | Status: DC | PRN

## 2015-10-28 MED ORDER — SODIUM CHLORIDE 0.9 % WEIGHT BASED INFUSION: 1.0000 mL/kg/h | INTRAVENOUS | Status: DC

## 2015-10-28 MED ORDER — MIDAZOLAM HCL 2 MG/2ML IJ SOLN
INTRAMUSCULAR | Status: AC
  Filled 2015-10-28: qty 2

## 2015-10-28 MED ORDER — HEPARIN SODIUM (PORCINE) 1000 UNIT/ML IJ SOLN
INTRAMUSCULAR | Status: DC | PRN
  Administered 2015-10-28: 5000 [IU] via INTRAVENOUS

## 2015-10-28 MED ORDER — SODIUM CHLORIDE 0.9% FLUSH: 3.0000 mL | Freq: Two times a day (BID) | INTRAVENOUS | Status: DC

## 2015-10-28 MED ORDER — HEPARIN SODIUM (PORCINE) 1000 UNIT/ML IJ SOLN
INTRAMUSCULAR | Status: AC
  Filled 2015-10-28: qty 1

## 2015-10-28 MED ORDER — ACETAMINOPHEN 325 MG PO TABS: 650.0000 mg | ORAL_TABLET | ORAL | Status: DC | PRN

## 2015-10-28 MED ORDER — SODIUM CHLORIDE 0.9 % IV SOLN
INTRAVENOUS | Status: DC | PRN
  Administered 2015-10-28: 75 mL/h via INTRAVENOUS

## 2015-10-28 MED ORDER — LIDOCAINE HCL (PF) 1 % IJ SOLN
INTRAMUSCULAR | Status: AC
  Filled 2015-10-28: qty 30

## 2015-10-28 MED ORDER — HEPARIN (PORCINE) IN NACL 2-0.9 UNIT/ML-% IJ SOLN
INTRAMUSCULAR | Status: DC | PRN
  Administered 2015-10-28: 10 mL via INTRA_ARTERIAL

## 2015-10-28 MED ORDER — VERAPAMIL HCL 2.5 MG/ML IV SOLN
INTRAVENOUS | Status: AC
  Filled 2015-10-28: qty 2

## 2015-10-28 MED ORDER — IOPAMIDOL (ISOVUE-370) INJECTION 76%
INTRAVENOUS | Status: AC
  Filled 2015-10-28: qty 100

## 2015-10-28 MED ORDER — FENTANYL CITRATE (PF) 100 MCG/2ML IJ SOLN
INTRAMUSCULAR | Status: AC
  Filled 2015-10-28: qty 2

## 2015-10-28 MED ORDER — HEPARIN (PORCINE) IN NACL 2-0.9 UNIT/ML-% IJ SOLN
INTRAMUSCULAR | Status: DC | PRN
  Administered 2015-10-28: 1000 mL

## 2015-10-28 MED ORDER — IOPAMIDOL (ISOVUE-370) INJECTION 76%
INTRAVENOUS | Status: DC | PRN
  Administered 2015-10-28: 100 mL via INTRAVENOUS

## 2015-10-28 SURGICAL SUPPLY — 9 items
CATH INFINITI 5 FR JL3.5 (CATHETERS) ×2 IMPLANT
CATH INFINITI JR4 5F (CATHETERS) ×2 IMPLANT
DEVICE RAD COMP TR BAND LRG (VASCULAR PRODUCTS) ×2 IMPLANT
GLIDESHEATH SLEND A-KIT 6F 22G (SHEATH) ×2 IMPLANT
KIT HEART LEFT (KITS) ×2 IMPLANT
PACK CARDIAC CATHETERIZATION (CUSTOM PROCEDURE TRAY) ×2 IMPLANT
TRANSDUCER W/STOPCOCK (MISCELLANEOUS) ×2 IMPLANT
TUBING CIL FLEX 10 FLL-RA (TUBING) ×2 IMPLANT
WIRE SAFE-T 1.5MM-J .035X260CM (WIRE) ×2 IMPLANT

## 2015-10-28 NOTE — H&P (View-Only) (Signed)
Cardiology Office Note   Date:  10/25/2015   ID:  [name] [name] [birth year], MRN [id number]  PCP:  Pearla Dubonnet, MD  Cardiologist:  Dr. Mayford Knife   Chief Complaint  Patient presents with  . Pre-op Exam    3 vessel disease and angina      History of Present Illness: [name] [name] is a 70 y.o. male who presents for results of nuc study and cardiac CT and to discuss cardiac cath per Dr. Mayford Knife.    He was seen by his PCP last week for evaluation of body cramps which were though to possibly be due to his statin therapy. His statin dose had been decreased with some improvement in his body aches but they were still present. He mentioned that he had been having CP with exertion. This has been going on for about 6 months. He has also noticed that he wears out when working out in the yard. He gets it when he is walking like playing golf and it tends to go away with rest. It is right sided and radiates into the substernal location. It is a heaviness and worsens when he gets emotionally upset. He has never had it with rest. He also gets DOE but mainly this is when he walks up stairs but not walking the dog. He has a history of HTN, dyslipidemia, nonobstructive CAD on cath in 1996 (60% LAD and 60-70% D1), GERD with esophageal stricture.  Pt had myoview and was negative for ischemia but continued with chest pain, cardiac CT with coronary calcium score 1951 and severe > 70 % stenosis in prox LAD.  Possible significant disease in the ostial RCA and LCX.  Possible balanced ischemia on nuc.  Also with thickening of distal esophageal wall, pt to see GI Dr. [name] 11/18/15.  Dr. Mayford Knife has recommended cardia cath for possible balanced ischamia on stress test and 3 vessel high risk CAD on cardiac CT.    Pt had one episode of chest pressure on Sunday at rest and took NTG with relief.  No other episodes.  He is on ASA and statin, will add BB metoprolol 25 mg BID.  He has had contrast without  problems in the past. No allergy to contrast or shellfish. .    Past Medical History  Diagnosis Date  . History of kidney stones   . Hypertension     takes Norvasc and Micardis daily  . Hyperlipidemia     takes Crestor daily  . Pneumonia     history of double 30+yrs ago  . History of seasonal allergies     takes Loratadine daily  . Joint pain   . Chronic back pain     herniated disc  . GERD (gastroesophageal reflux disease)     takes Omeprazole daily  . Diverticulosis   . Nocturia   . Muscle cramps     takes KDUr and Mag Ox as a trial to see if it will help  . History of gout     takes Allopurinol daily  . Chronic kidney disease     Kidney stone  . CAD (coronary artery disease), native coronary artery 09/12/2015    Past Surgical History  Procedure Laterality Date  . Cystoscopy  1993  . Foot surgery  65yrs ago    right-removed bone spur   . Colonoscopy    . Esophagogastroduodenoscopy      with diliation  . Lithotripsy      x 2  .  Vasectomy  37yrs ago  . Lumbar laminectomy  03-07-2012    with fusion  . Cystoscopy with ureteroscopy  07/28/2012    Procedure: CYSTOSCOPY WITH URETEROSCOPY;  Surgeon: [name], MD;  Location: [facility];  Service: Urology;  Laterality: Left;     . Holmium laser application  07/28/2012    Procedure: HOLMIUM LASER APPLICATION;  Surgeon: [name], MD;  Location: [facility];  Service: Urology;  Laterality: Left;  left lithotripsy     Current Outpatient Prescriptions  Medication Sig Dispense Refill  . allopurinol (ZYLOPRIM) 100 MG tablet Take 100 mg by mouth every morning.     [name] amLODipine (NORVASC) 5 MG tablet Take 5 mg by mouth every morning.     [name] aspirin EC 81 MG tablet Take 81 mg by mouth daily.    . cholecalciferol (VITAMIN D) 1000 UNITS tablet Take 1,000 Units by mouth daily.    . Coenzyme Q10 (COQ10) 100 MG CAPS Take 1 capsule by mouth daily.    [name] ezetimibe (ZETIA) 10 MG tablet Take 1 tablet (10 mg total) by mouth daily. 30 tablet 11  .  fexofenadine (ALLEGRA) 180 MG tablet Take 180 mg by mouth daily as needed. (allergies)    . loratadine (CLARITIN) 10 MG tablet Take 10 mg by mouth daily.    . Multiple Vitamin (MULTIVITAMIN WITH MINERALS) TABS Take 1 tablet by mouth daily.    . nitroGLYCERIN (NITROSTAT) 0.4 MG SL tablet Place 0.4 mg under the tongue every 5 (five) minutes x 3 doses as needed. (chest pain)  0  . Omega-3 Fatty Acids (FISH OIL) 1200 MG CAPS Take 1 capsule by mouth daily.    [name] OVER THE COUNTER MEDICATION Take 1 capsule by mouth daily. 200 mg pill daily of Co Q 10    . pantoprazole (PROTONIX) 40 MG tablet Take 1 tablet (40 mg total) by mouth daily. 30 tablet 11  . pravastatin (PRAVACHOL) 20 MG tablet Take 1 tablet (20 mg total) by mouth every evening. 30 tablet 11  . Tamsulosin HCl (FLOMAX) 0.4 MG CAPS Take 0.4 mg by mouth daily after supper.    . telmisartan (MICARDIS) 80 MG tablet Take 80 mg by mouth every morning.     No current facility-administered medications for this visit.    Allergies:   Atorvastatin; Hctz; and Rosuvastatin    Social History:  The patient  reports that he has never smoked. He has never used smokeless tobacco. He reports that he does not drink alcohol or use illicit drugs.   Family History:  The patient's family history includes Dementia in his mother; Prostate cancer in his father.    ROS:  General:no colds or fevers, no weight changes Skin:no rashes or ulcers HEENT:no blurred vision, no congestion CV:see HPI PUL:see HPI GI:no diarrhea constipation or melena, no indigestion GU:no hematuria, no dysuria MS:no joint pain, no claudication Neuro:no syncope, no lightheadedness Endo:no diabetes, no thyroid disease  Wt Readings from Last 3 Encounters:  10/25/15 217 lb 12.8 oz (98.793 kg)  09/12/15 217 lb 12.8 oz (98.793 kg)  07/21/12 209 lb (94.802 kg)     PHYSICAL EXAM: VS:  BP 115/82 mmHg  Pulse 88  Ht  (1.676 m)  Wt 217 lb 12.8 oz (98.793 kg)  BMI 35.17 kg/m2  SpO2  99% , BMI Body mass index is 35.17 kg/(m^2). General:Pleasant affect, NAD Skin:Warm and dry, brisk capillary refill HEENT:normocephalic, sclera clear, mucus membranes moist Neck:supple, no JVD, no bruits  Heart:S1S2 RRR without murmur,  gallup, rub or click Lungs:clear without rales, rhonchi, or wheezes GNF:AOZHAbd:soft, non tender, + BS, do not palpate liver spleen or masses Ext:no lower ext edema, 2+ pedal pulses, 2+ radial pulses Neuro:alert and oriented, MAE, follows commands, + facial symmetry    EKG:  EKG is NOT ordered today.    Recent Labs: 10/14/2015: ALT 37; BUN 22; Creat 1.68*; Potassium 4.1; Sodium 141    Lipid Panel    Component Value Date/Time   CHOL 205* 10/14/2015 0839   TRIG 175* 10/14/2015 0839   HDL 45 10/14/2015 0839   CHOLHDL 4.6 10/14/2015 0839   VLDL 35* 10/14/2015 0839   LDLCALC 125 10/14/2015 0839       Other studies Reviewed: Additional studies/ records that were reviewed today include:  . NUC study: Overall Study Impression Myocardial perfusion is normal. The study is normal. This is a low risk study. Overall left ventricular systolic function was normal. LV cavity size is normal. Nuclear stress EF: 54%. The left ventricular ejection fraction is mildly decreased (45-54%). There is no prior study for comparison  Cardiac CT: IMPRESSION: 1. Coronary calcium score of 1951. This was 3997 percentile for age and sex matched control. 2. Normal coronary origin with right dominance. 3. This study is significantly affected by motion and patient size. However, there is severe > 70 % stenosis in the proximal LAD. There is suspicion for a significant disease in the ostial RCA and LCX arteries. Cardiac cath is recommended (possibly balance ischemia on nuclear scan).  Radiology over read: FINDINGS: Mild bibasilar pulmonary parenchymal volume loss. Distal esophageal wall thickening can be seen with gastroesophageal reflux disease. Extracardiac structures are  otherwise unremarkable.  IMPRESSION: Distal esophageal wall thickening can be seen with gastroesophageal reflux disease. ASSESSMENT AND PLAN:  1. Chest pain- angina   He has a history of nonobstructive ASCAD by cath in 1996 with LAD 60% and 60-70% D1. He is having discomfort with exertion playing golf and now at rest with some relief with NTG.   EKG was normal.Lexiscan  myoview was neg for ischemia  But with continued discomfort. He had cardiac CT with results as above, High risk for significant CAD.   Discussed his CAD and several scenarios PCI to CABG.  Or not as significant as seems.    The patient understands that risks included but are not limited to stroke (1 in 1000), death (1 in 1000), kidney failure [usually temporary] (1 in 500), bleeding (1 in 200), allergic reaction [possibly serious] (1 in 200).   Pt agreeable to proceed.    2. ASCAD with nonobstructive disease by cath in 1996. Continue ASA/statin and I added lopressor 25 mg BID.   3. Hyperlipidemia - continue low dose statin. He has had some cramping but minimal. Intolerant to lipitor (cramps and dizziness) and on low dose crestor (cramps). He is now off of the crestor and the cramps have resolved. I will try Pravachol 20mg  daily and recheck FLP and ALT in 6 weeks. If he does not tolerate this then will refer to lipid clinic.  4. HTN - controlled on current medical regimen. Continue amlodipine and now BB.   Procedure is scheduled with Dr. Katrinka BlazingSmith on Friday this week he will hold micardis the am of procedure.   All questions answered.     Current medicines are reviewed with the patient today.  The patient Has no concerns regarding medicines.  The following changes have been made:  See above Labs/ tests ordered today include:see above  Disposition:  FU:  see above  Nyoka Lint, NP  10/25/2015 8:49 AM    [facility] [address] [place], [place], [place]  [address]/ [address] [employer] [address] [place], [place] Phone: [phone]; Fax: [phone]

## 2015-10-28 NOTE — Interval H&P Note (Signed)
Cath Lab Visit (complete for each Cath Lab visit)  Clinical Evaluation Leading to the Procedure:   ACS: No.  Non-ACS:    Anginal Classification: CCS III  Anti-ischemic medical therapy: Minimal Therapy (1 class of medications)  Non-Invasive Test Results: Low risk myocardial perfusion study  Prior CABG: No previous CABG      History and Physical Interval Note:  10/28/2015 7:46 AM  [name] [name]  has presented today for surgery, with the diagnosis of cad/angina  The various methods of treatment have been discussed with the patient and family. After consideration of risks, benefits and other options for treatment, the patient has consented to  Procedure(s): Left Heart Cath and Coronary Angiography (N/A) as a surgical intervention .  The patient's history has been reviewed, patient examined, no change in status, stable for surgery.  I have reviewed the patient's chart and labs.  Questions were answered to the patient's satisfaction.     Lesleigh NoeSMITH III,Yaphet Smethurst W

## 2015-10-28 NOTE — Discharge Instructions (Signed)
Radial Site Care °Refer to this sheet in the next few weeks. These instructions provide you with information about caring for yourself after your procedure. Your health care provider may also give you more specific instructions. Your treatment has been planned according to current medical practices, but problems sometimes occur. Call your health care provider if you have any problems or questions after your procedure. °WHAT TO EXPECT AFTER THE PROCEDURE °After your procedure, it is typical to have the following: °· Bruising at the radial site that usually fades within 1-2 weeks. °· Blood collecting in the tissue (hematoma) that may be painful to the touch. It should usually decrease in size and tenderness within 1-2 weeks. °HOME CARE INSTRUCTIONS °· Take medicines only as directed by your health care provider. °· You may shower 24-48 hours after the procedure or as directed by your health care provider. Remove the bandage (dressing) and gently wash the site with plain soap and water. Pat the area dry with a clean towel. Do not rub the site, because this may cause bleeding. °· Do not take baths, swim, or use a hot tub until your health care provider approves. °· Check your insertion site every day for redness, swelling, or drainage. °· Do not apply powder or lotion to the site. °· Do not flex or bend the affected arm for 24 hours or as directed by your health care provider. °· Do not push or pull heavy objects with the affected arm for 24 hours or as directed by your health care provider. °· Do not lift over 10 lb (4.5 kg) for 5 days after your procedure or as directed by your health care provider. °· Ask your health care provider when it is okay to: °¨ Return to work or school. °¨ Resume usual physical activities or sports. °¨ Resume sexual activity. °· Do not drive home if you are discharged the same day as the procedure. Have someone else drive you. °· You may drive 24 hours after the procedure unless otherwise  instructed by your health care provider. °· Do not operate machinery or power tools for 24 hours after the procedure. °· If your procedure was done as an outpatient procedure, which means that you went home the same day as your procedure, a responsible adult should be with you for the first 24 hours after you arrive home. °· Keep all follow-up visits as directed by your health care provider. This is important. °SEEK MEDICAL CARE IF: °· You have a fever. °· You have chills. °· You have increased bleeding from the radial site. Hold pressure on the site. °SEEK IMMEDIATE MEDICAL CARE IF: °· You have unusual pain at the radial site. °· You have redness, warmth, or swelling at the radial site. °· You have drainage (other than a small amount of blood on the dressing) from the radial site. °· The radial site is bleeding, and the bleeding does not stop after 30 minutes of holding steady pressure on the site. °· Your arm or hand becomes pale, cool, tingly, or numb. °  °This information is not intended to replace advice given to you by your health care provider. Make sure you discuss any questions you have with your health care provider. °  °Document Released: 08/04/2010 Document Revised: 07/23/2014 Document Reviewed: 01/18/2014 °Elsevier Interactive Patient Education ©2016 [employer] ° °

## 2015-10-28 NOTE — Research (Signed)
[place] STUDY Informed Consent   Subject Name: [name] [name]  Subject met inclusion and exclusion criteria.  The informed consent form, study requirements and expectations were reviewed with the subject and questions and concerns were addressed prior to the signing of the consent form.  The subject verbalized understanding of the trial requirements.  The subject agreed to participate in the [place] trial and signed the informed consent.  The informed consent was obtained prior to performance of any protocol-specific procedures for the subject.  A copy of the signed informed consent was given to the subject and a copy was placed in the subject's medical record.  [name] 10/28/2015, 07:10

## 2015-10-31 MED FILL — Heparin Sodium (Porcine) 2 Unit/ML in Sodium Chloride 0.9%: INTRAMUSCULAR | Qty: 500 | Status: AC

## 2015-11-01 ENCOUNTER — Other Ambulatory Visit: Payer: Self-pay | Admitting: *Deleted

## 2015-11-01 ENCOUNTER — Encounter: Payer: Self-pay | Admitting: Thoracic Surgery (Cardiothoracic Vascular Surgery)

## 2015-11-01 ENCOUNTER — Ambulatory Visit (INDEPENDENT_AMBULATORY_CARE_PROVIDER_SITE_OTHER): Payer: Medicare Other | Admitting: Thoracic Surgery (Cardiothoracic Vascular Surgery)

## 2015-11-01 VITALS — BP 128/79 | HR 71 | Resp 16 | Ht 66.0 in | Wt 215.0 lb

## 2015-11-01 DIAGNOSIS — I208 Other forms of angina pectoris: Secondary | ICD-10-CM

## 2015-11-01 DIAGNOSIS — I2089 Other forms of angina pectoris: Secondary | ICD-10-CM | POA: Insufficient documentation

## 2015-11-01 DIAGNOSIS — I25119 Atherosclerotic heart disease of native coronary artery with unspecified angina pectoris: Secondary | ICD-10-CM | POA: Diagnosis not present

## 2015-11-01 DIAGNOSIS — I2510 Atherosclerotic heart disease of native coronary artery without angina pectoris: Secondary | ICD-10-CM

## 2015-11-01 NOTE — Progress Notes (Addendum)
[address]       [place] [id number]             [phone]     CARDIOTHORACIC SURGERY CONSULTATION REPORT  Referring Provider is Lesleigh NoeSmith, Henry W, III, MD Primary Cardiologist is Quintella Reicherturner, Traci R, MD PCP is Pearla DubonnetGATES,ROBERT NEVILL, MD  Chief Complaint  Patient presents with  . Coronary Artery Disease    eval for CABG...CATH 10/28/15, CT CARD. MORH. 10/18/15, STRESS TEST 09/23/15    HPI:  Patient is a 70 year old male with history of coronary artery disease, hypertension, and hyperlipidemia referred for surgical consultation to discuss treatment options for management of severe two-vessel coronary artery disease. The patient states that he was first noted to have nonobstructive coronary artery disease in 1996. He underwent a stress test at that time followed by diagnostic cardiac catheterization. He was noted to have moderate nonobstructive coronary artery disease. He was treated medically. He has done very well from a cardiac standpoint until recently.  He was seen in follow-up by his primary care physician approximately 2 months ago at which time he was complaining of body cramps that were felt to be related to statin therapy. His statin dose was adjusted and at the time the patient also mentioned having symptoms of exertional chest discomfort. He describes a pressure-like discomfort across the right side of the chest that radiates to the substernal position. It typically is brought on by walking, working in the ER, or playing golf. Symptoms have been relatively mild but persistent for several months. He was referred for cardiology consultation and evaluated by Dr. Mayford Knifeurner on 09/12/2015.  A stress my view was performed and reportedly negative for ischemia, but the patient's symptoms persisted. Cardiac gated CT angiogram was notable for coronary calcium score of 1951 and severe greater than 70% stenosis of the proximal left anterior descending coronary artery. The patient  subsequently was scheduled for elective diagnostic cardiac catheterization which was performed 10/28/2015 by Dr. Katrinka BlazingSmith.  The patient was found to have diffuse calcification throughout the coronary circulation with 50% ostial and 60% distal stenosis of the left main coronary artery, greater than 80% long segment calcified proximal stenosis of the left anterior descending coronary artery, heavily calcified eccentric 80-90% stenosis in the ostial left circumflex coronary artery, and normal left ventricular systolic function. The patient's coronary anatomy is felt to be relatively unfavorable for PCI and stenting. He has been referred for elective surgical consultation.  The patient is married and lives locally in [place] with his [place]. He has been retired since 2005, having previously worked for the city of [place]. He remains physically active and functionally independent. He enjoys playing golf. He reports no specific physical limitations. Several months ago the patient began to experience substernal chest discomfort described as a pressure-like pain across the right side of his chest that radiates to the middle. Symptoms are typically brought on with physical exertion. On one occasion he recently took sublingual nitroglycerin and the symptoms resolved promptly. Since starting beta blocker therapy if the frequency of symptoms has decreased. The patient denies any episodes of prolonged substernal chest discomfort. He has not had nocturnal chest pain. He denies any associated shortness of breath either with activity or rest.  He denies any history of dizzy spells, palpitations, or syncope.  Past Medical History  Diagnosis Date  . History of kidney stones   . Hypertension     takes Norvasc and Micardis daily  . Hyperlipidemia  takes Crestor daily  . Pneumonia     history of double 30+yrs ago  . History of seasonal allergies     takes Loratadine daily  . Joint pain   . Chronic back pain      herniated disc  . GERD (gastroesophageal reflux disease)     takes Omeprazole daily  . Diverticulosis   . Nocturia   . Muscle cramps     takes KDUr and Mag Ox as a trial to see if it will help  . History of gout     takes Allopurinol daily  . Chronic kidney disease     Kidney stone  . CAD (coronary artery disease), native coronary artery 09/12/2015    Past Surgical History  Procedure Laterality Date  . Cystoscopy  1993  . Foot surgery  74yrs ago    right-removed bone spur   . Colonoscopy    . Esophagogastroduodenoscopy      with diliation  . Lithotripsy      x 2  . Vasectomy  79yrs ago  . Lumbar laminectomy  03-07-2012    with fusion  . Cystoscopy with ureteroscopy  07/28/2012    Procedure: CYSTOSCOPY WITH URETEROSCOPY;  Surgeon: [name], MD;  Location: [facility];  Service: Urology;  Laterality: Left;     . Holmium laser application  07/28/2012    Procedure: HOLMIUM LASER APPLICATION;  Surgeon: [name], MD;  Location: [facility];  Service: Urology;  Laterality: Left;  left lithotripsy  . Cardiac catheterization N/A 10/28/2015    Procedure: Left Heart Cath and Coronary Angiography;  Surgeon: [name], MD;  Location: [facility] IN[facility] CV [facility];  Service: Cardiovascular;  Laterality: N/A;    Family History  Problem Relation Age of Onset  . Dementia Mother   . Prostate cancer Father     Social History   Social History  . Marital Status: Married    Spouse Name: N/A  . Number of Children: N/A  . Years of Education: N/A   Occupational History  . Not on file.   Social History Main Topics  . Smoking status: Never Smoker   . Smokeless tobacco: Never Used  . Alcohol Use: No  . Drug Use: No  . Sexual Activity: Yes   Other Topics Concern  . Not on file   Social History Narrative    Current Outpatient Prescriptions  Medication Sig Dispense Refill  . allopurinol (ZYLOPRIM) 100 MG tablet Take 100 mg by mouth every morning.     [name] amLODipine (NORVASC) 5 MG tablet Take 5 mg  by mouth every morning.     [name] aspirin EC 81 MG tablet Take 81 mg by mouth daily.    . cholecalciferol (VITAMIN D) 1000 UNITS tablet Take 1,000 Units by mouth daily.    . Coenzyme Q10 (COQ10) 100 MG CAPS Take 2 capsules by mouth daily.     [name] ezetimibe (ZETIA) 10 MG tablet Take 1 tablet (10 mg total) by mouth daily. 30 tablet 11  . fexofenadine (ALLEGRA) 180 MG tablet Take 180 mg by mouth daily as needed. (allergies)    . loratadine (CLARITIN) 10 MG tablet Take 10 mg by mouth daily.    . metoprolol tartrate (LOPRESSOR) 25 MG tablet Take 1 tablet (25 mg total) by mouth 2 (two) times daily. 180 tablet 3  . Multiple Vitamin (MULTIVITAMIN WITH MINERALS) TABS Take 1 tablet by mouth daily.    . nitroGLYCERIN (NITROSTAT) 0.4 MG SL tablet Place 0.4 mg under the  tongue every 5 (five) minutes x 3 doses as needed. (chest pain)  0  . Omega-3 Fatty Acids (FISH OIL) 1200 MG CAPS Take 1 capsule by mouth daily.    . pantoprazole (PROTONIX) 40 MG tablet Take 1 tablet (40 mg total) by mouth daily. 30 tablet 11  . pravastatin (PRAVACHOL) 20 MG tablet Take 1 tablet (20 mg total) by mouth every evening. 30 tablet 11  . Tamsulosin HCl (FLOMAX) 0.4 MG CAPS Take 0.4 mg by mouth daily after supper.    . telmisartan (MICARDIS) 80 MG tablet Take 80 mg by mouth every morning.     No current facility-administered medications for this visit.    Allergies  Allergen Reactions  . Atorvastatin Other (See Comments)    Reaction: dizziness/ dyspepsia  . Hctz [Hydrochlorothiazide] Other (See Comments)    Reaction: arthralgias/ skin burning  . Rosuvastatin Other (See Comments)    Reaction: muscle cramping      Review of Systems:   General:  normal appetite, decreased energy, no weight gain, no weight loss, no fever  Cardiac:  + chest pain with exertion, + occasional brief episodes chest pain at rest, no SOB with exertion, no resting SOB, no PND, no orthopnea, no palpitations, no arrhythmia, no atrial fibrillation, no LE  edema, no dizzy spells, no syncope  Respiratory:  no shortness of breath, no home oxygen, no productive cough, no dry cough, no bronchitis, no wheezing, no hemoptysis, no asthma, no pain with inspiration or cough, no sleep apnea, no CPAP at night  GI:   + mild difficulty swallowing, + reflux, + frequent heartburn, no hiatal hernia, no abdominal pain, no constipation, no diarrhea, no hematochezia, no hematemesis, no melena  GU:   no dysuria,  no frequency, no urinary tract infection, no hematuria, no enlarged prostate, + kidney stones, no kidney disease  Vascular:  no pain suggestive of claudication, no pain in feet, no leg cramps, no varicose veins, no DVT, no non-healing foot ulcer  Neuro:   no stroke, no TIA's, no seizures, no headaches, no temporary blindness one eye,  no slurred speech, no peripheral neuropathy, no chronic pain, no instability of gait, no memory/cognitive dysfunction  Musculoskeletal: + arthritis in lower back, no joint swelling, no myalgias, no difficulty walking, no mobility   Skin:   no rash, no itching, no skin infections, no pressure sores or ulcerations  Psych:   no anxiety, no depression, no nervousness, no unusual recent stress  Eyes:   no blurry vision, no floaters, no recent vision changes, does not wears glasses or contacts  ENT:   no hearing loss, no loose or painful teeth, no dentures, last saw dentist Dec 2016  Hematologic:  no easy bruising, no abnormal bleeding, no clotting disorder, no frequent epistaxis  Endocrine:  no diabetes, does not check CBG's at home     Physical Exam:   BP 128/79 mmHg  Pulse 71  Resp 16  Ht  (1.676 m)  Wt 215 lb (97.523 kg)  BMI 34.72 kg/m2  SpO2 96%  General:  Mildly obese,  well-appearing  HEENT:  Unremarkable   Neck:   no JVD, no bruits, no adenopathy   Chest:   clear to auscultation, symmetrical breath sounds, no wheezes, no rhonchi   CV:   RRR, no  murmur   Abdomen:  soft, non-tender, no masses    Extremities:  warm, well-perfused, pulses palpable, no LE edema  Rectal/GU  Deferred  Neuro:   Grossly non-focal and symmetrical  throughout  Skin:   Clean and dry, no rashes, no breakdown   Diagnostic Tests:  CARDIAC CATHETERIZATION Procedures    Left Heart Cath and Coronary Angiography    Conclusion    1. Mid Cx to Dist Cx lesion, 80% stenosed. 2. Ost LAD to Prox LAD lesion, 80% stenosed. 3. Ost LM to LM lesion, 60% stenosed. 4. Ost LM lesion, 60% stenosed. 5. Ost Cx lesion, 75% stenosed. 6. Ost Ramus lesion, 75% stenosed. 7. Ost RCA lesion, 40% stenosed.   Severe diffuse left coronary calcification.  50% eccentric ostial left main and 60% distal left main.  Diffuse greater than 80% proximal to mid LAD.  Heavily calcified eccentric 80-90% stenosis in the ostial to proximal circumflex.  40% proximal RCA  Low normal LV systolic function with normal LVEDP  RECOMMENDATIONS:   TCTS consultation to consider coronary bypass grafting.     Indications    Coronary artery disease involving native coronary artery of native heart with angina pectoris (HCC) [I25.119 (ICD-10-CM)]   Ischemic chest pain (HCC) [I20.9 (ICD-10-CM)]    Technique and Indications    The right radial area was sterilely prepped and draped. Intravenous sedation with Versed and fentanyl was administered. 1% Xylocaine was infiltrated to achieve local analgesia. A double wall stick with an angiocath was utilized to obtain intra-arterial access. The modified Seldinger technique was used to place a 3F " Slender" sheath in the right radial artery. Weight based heparin was administered. Coronary angiography was done using 5 F catheters. Right coronary angiography was performed with a JR4. Left ventricular hemodymic recordings and angiography was done using the JR 4 catheter and hand injection. Left coronary angiography was performed with a JL 3.5 cm.  Hemostasis was achieved using a pneumatic  band.  During this procedure the patient is administered a total of Versed 1 mg and Fentanyl 50 mcg to achieve and maintain moderate conscious sedation. The patient's heart rate, blood pressure, and oxygen saturation are monitored continuously during the procedure. The period of conscious sedation is 21 minutes, of which I was present face-to-face 100% of this time.Estimated blood loss <50 mL. There were no immediate complications during the procedure.    Coronary Findings    Dominance: Right   Left Main   . Ost LM lesion, 60% stenosed. Eccentric.   Suezanne Jacquet LM to LM lesion, 60% stenosed. The lesion is type C Calcified.     Left Anterior Descending   . Ost LAD to Prox LAD lesion, 80% stenosed. The lesion is type C Calcified eccentric.     Ramus Intermedius   . Ost Ramus lesion, 75% stenosed. Eccentric.     Left Circumflex   . Ost Cx lesion, 75% stenosed. Calcified eccentric.   . Mid Cx to Dist Cx lesion, 80% stenosed. Calcified tubular eccentric.   . First Obtuse Marginal Branch   The vessel is small in size.   [name] Second Obtuse Marginal Branch   The vessel is small in size.     Right Coronary Artery   . Ost RCA lesion, 40% stenosed.      Wall Motion                 Coronary Diagrams    Diagnostic Diagram            Implants     No implant documentation for this case.    PACS Images    Show images for Cardiac catheterization     Link to Procedure Log  Procedure Log      Hemo Data       Most Recent Value   AO Systolic Pressure  113 mmHg   AO Diastolic Pressure  69 mmHg   AO Mean  89 mmHg   LV Systolic Pressure  108 mmHg   LV Diastolic Pressure  5 mmHg   LV EDP  16 mmHg   Arterial Occlusion Pressure Extended Systolic Pressure  108 mmHg   Arterial Occlusion Pressure Extended Diastolic Pressure  67 mmHg   Arterial Occlusion Pressure Extended Mean Pressure  86 mmHg   Left Ventricular Apex Extended Systolic Pressure  109 mmHg    Left Ventricular Apex Extended Diastolic Pressure  9 mmHg   Left Ventricular Apex Extended EDP Pressure  16 mmHg       Impression:  Patient has severe multivessel coronary artery disease with preserved left ventricular systolic function and presents with symptoms of classical exertional angina. I have personally reviewed the patient's recent diagnostic cardiac catheterization.  The patient has 50% ostial and 60% distal stenosis of the left main coronary artery with diffuse heavy calcification throughout the proximal left anterior descending coronary artery and the left circumflex coronary artery. There is severe 80% proximal stenosis of the left anterior descending coronary artery and high-grade ostial stenosis of left circumflex coronary artery. Coronary anatomy appears very unfavorable for percutaneous coronary intervention. I agree the patient would best be treated with surgical revascularization.  Risks associated with surgery should be relatively low.   Plan:  I have reviewed the indications, risks, and potential benefits of coronary artery bypass grafting with the patient and his wife.  Alternative treatment strategies have been discussed, including the relative risks, benefits and long term prognosis associated with medical therapy, percutaneous coronary intervention, and surgical revascularization.  The patient understands and accepts all potential associated risks of surgery including but not limited to risk of death, stroke or other neurologic complication, myocardial infarction, congestive heart failure, respiratory failure, renal failure, bleeding requiring blood transfusion and/or reexploration, aortic dissection or other major vascular complication, arrhythmia, heart block or bradycardia requiring permanent pacemaker, pneumonia, pleural effusion, wound infection, pulmonary embolus or other thromboembolic complication, chronic pain or other delayed complications related to median  sternotomy, or the late recurrence of symptomatic ischemic heart disease and/or congestive heart failure.  The importance of long term risk modification have been emphasized.  All questions answered.  We tentatively plan to proceed with surgery on Friday, 11/04/2015.    I spent in excess of 90 minutes during the conduct of this office consultation and >50% of this time involved direct face-to-face encounter with the patient for counseling and/or coordination of their care.   Salvatore Decent. Cornelius Moras, MD 11/01/2015 3:12 PM

## 2015-11-01 NOTE — Patient Instructions (Signed)
Patient has been instructed to stop taking fish oil  Patient should continue taking all other medications without change through the day before surgery.  Patient should have nothing to eat or drink after midnight the night before surgery.  On the morning of surgery patient should take only Protonix and Metoprolol with a sip of water.

## 2015-11-01 NOTE — Progress Notes (Signed)
This encounter was created in error - please disregard.

## 2015-11-02 ENCOUNTER — Encounter (HOSPITAL_COMMUNITY): Payer: Self-pay

## 2015-11-02 ENCOUNTER — Ambulatory Visit (HOSPITAL_COMMUNITY)
Admission: RE | Admit: 2015-11-02 | Discharge: 2015-11-02 | Disposition: A | Discharge status: Home / Self Care | Payer: Medicare Other | Source: Ambulatory Visit | Attending: Thoracic Surgery (Cardiothoracic Vascular Surgery) | Admitting: Thoracic Surgery (Cardiothoracic Vascular Surgery)

## 2015-11-02 ENCOUNTER — Encounter (HOSPITAL_COMMUNITY)
Admission: RE | Admit: 2015-11-02 | Discharge: 2015-11-02 | Disposition: A | Discharge status: Home / Self Care | Payer: Medicare Other | Source: Ambulatory Visit | Attending: Thoracic Surgery (Cardiothoracic Vascular Surgery) | Admitting: Thoracic Surgery (Cardiothoracic Vascular Surgery)

## 2015-11-02 VITALS — BP 115/80 | HR 100 | Temp 97.7°F | Resp 18 | Ht 66.0 in | Wt 216.8 lb

## 2015-11-02 DIAGNOSIS — Z01812 Encounter for preprocedural laboratory examination: Secondary | ICD-10-CM

## 2015-11-02 DIAGNOSIS — Z01818 Encounter for other preprocedural examination: Secondary | ICD-10-CM | POA: Insufficient documentation

## 2015-11-02 DIAGNOSIS — Z0183 Encounter for blood typing: Secondary | ICD-10-CM

## 2015-11-02 DIAGNOSIS — I2510 Atherosclerotic heart disease of native coronary artery without angina pectoris: Secondary | ICD-10-CM

## 2015-11-02 LAB — BLOOD GAS, ARTERIAL
Acid-base deficit: 0.2 mmol/L (ref 0.0–2.0)
Bicarbonate: 23.4 mEq/L (ref 20.0–24.0)
DRAWN BY: 42180
FIO2: 0.21
O2 SAT: 97 %
Patient temperature: 98.6
TCO2: 24.4 mmol/L (ref 0–100)
pCO2 arterial: 34.5 mmHg — ABNORMAL LOW (ref 35.0–45.0)
pH, Arterial: 7.446 (ref 7.350–7.450)
pO2, Arterial: 96.5 mmHg (ref 80.0–100.0)

## 2015-11-02 LAB — COMPREHENSIVE METABOLIC PANEL
ALBUMIN: 3.9 g/dL (ref 3.5–5.0)
ALK PHOS: 70 U/L (ref 38–126)
ALT: 39 U/L (ref 17–63)
AST: 32 U/L (ref 15–41)
Anion gap: 11 (ref 5–15)
BILIRUBIN TOTAL: 0.8 mg/dL (ref 0.3–1.2)
BUN: 20 mg/dL (ref 6–20)
CALCIUM: 9.6 mg/dL (ref 8.9–10.3)
CO2: 21 mmol/L — AB (ref 22–32)
Chloride: 109 mmol/L (ref 101–111)
Creatinine, Ser: 1.8 mg/dL — ABNORMAL HIGH (ref 0.61–1.24)
GFR calc Af Amer: 43 mL/min — ABNORMAL LOW (ref >60)
GFR calc non Af Amer: 37 mL/min — ABNORMAL LOW (ref >60)
GLUCOSE: 92 mg/dL (ref 65–99)
POTASSIUM: 4 mmol/L (ref 3.5–5.1)
Sodium: 141 mmol/L (ref 135–145)
TOTAL PROTEIN: 7.1 g/dL (ref 6.5–8.1)

## 2015-11-02 LAB — URINALYSIS, ROUTINE W REFLEX MICROSCOPIC
BILIRUBIN URINE: NEGATIVE
Glucose, UA: NEGATIVE mg/dL
Hgb urine dipstick: NEGATIVE
Ketones, ur: NEGATIVE mg/dL
Leukocytes, UA: NEGATIVE
NITRITE: NEGATIVE
PROTEIN: NEGATIVE mg/dL
SPECIFIC GRAVITY, URINE: 1.021 (ref 1.005–1.030)
pH: 6 (ref 5.0–8.0)

## 2015-11-02 LAB — APTT: aPTT: 26 seconds (ref 24–37)

## 2015-11-02 LAB — TYPE AND SCREEN
ABO/RH(D): A NEG
ANTIBODY SCREEN: NEGATIVE

## 2015-11-02 LAB — CBC
HEMATOCRIT: 39.6 % (ref 39.0–52.0)
HEMOGLOBIN: 12.8 g/dL — AB (ref 13.0–17.0)
MCH: 27.4 pg (ref 26.0–34.0)
MCHC: 32.3 g/dL (ref 30.0–36.0)
MCV: 84.6 fL (ref 78.0–100.0)
Platelets: 159 10*3/uL (ref 150–400)
RBC: 4.68 MIL/uL (ref 4.22–5.81)
RDW: 16 % — ABNORMAL HIGH (ref 11.5–15.5)
WBC: 8.7 10*3/uL (ref 4.0–10.5)

## 2015-11-02 LAB — SURGICAL PCR SCREEN
MRSA, PCR: NEGATIVE
STAPHYLOCOCCUS AUREUS: POSITIVE — AB

## 2015-11-02 LAB — PROTIME-INR
INR: 1.06 (ref 0.00–1.49)
Prothrombin Time: 14 seconds (ref 11.6–15.2)

## 2015-11-02 NOTE — Pre-Procedure Instructions (Signed)
[name] [name]  11/02/2015      [facility] - [place] - [address] [place] [id number] Phone: [phone] Fax: [phone]    Your procedure is scheduled on 11/04/2015- FRIDAY.  [facility] Admitting at 5:30  A.M.  Call this number if you have problems the morning of surgery:  [phone]   Remember:  Do not eat food or drink liquids after midnight.  On Thursday  Take these medicines the morning of surgery with A SIP OF WATER : Metoprolol, Protonix   Do not wear jewelry   Do not wear lotions, powders, or perfumes.  You may NOT wear deodorant.     Men may shave face and neck.   Do not bring valuables to the hospital.   [facility] is not responsible for any belongings or valuables.  Contacts, dentures or bridgework may not be worn into surgery.  Leave your suitcase in the car.  After surgery it may be brought to your room.  For patients admitted to the hospital, discharge time will be determined by your treatment team.  Patients discharged the day of surgery will not be allowed to drive home.   Name and phone number of your driver:   With  family Special instructions:  Special Instructions: [place] - Preparing for Surgery  Before surgery, you can play an important role.  Because skin is not sterile, your skin needs to be as free of germs as possible.  You can reduce the number of germs on you skin by washing with CHG (chlorahexidine gluconate) soap before surgery.  CHG is an antiseptic cleaner which kills germs and bonds with the skin to continue killing germs even after washing.  Please DO NOT use if you have an allergy to CHG or antibacterial soaps.  If your skin becomes reddened/irritated stop using the CHG and inform your nurse when you arrive at Short Stay.  Do not shave (including legs and underarms) for at least 48 hours prior to the first CHG shower.  You may shave your  face.  Please follow these instructions carefully:   1.  Shower with CHG Soap the night before surgery and the  morning of Surgery.  2.  If you choose to wash your hair, wash your hair first as usual with your  normal shampoo.  3.  After you shampoo, rinse your hair and body thoroughly to remove the  Shampoo.  4.  Use CHG as you would any other liquid soap.  You can apply chg directly to the skin and wash gently with scrungie or a clean washcloth.  5.  Apply the CHG Soap to your body ONLY FROM THE NECK DOWN.    Do not use on open wounds or open sores.  Avoid contact with your eyes, ears, mouth and genitals (private parts).  Wash genitals (private parts)   with your normal soap.  6.  Wash thoroughly, paying special attention to the area where your surgery will be performed.  7.  Thoroughly rinse your body with warm water from the neck down.  8.  DO NOT shower/wash with your normal soap after using and rinsing off   the CHG Soap.  9.  Pat yourself dry with a clean towel.            10.  Wear clean pajamas.            11.  Place clean sheets on  your bed the night of your first shower and do not sleep with pets.  Day of Surgery  Do not apply any lotions/deodorants the morning of surgery.  Please wear clean clothes to the hospital/surgery center.  Please read over the following fact sheets that you were given. Pain Booklet, Coughing and Deep Breathing, Blood Transfusion Information, MRSA Information and Surgical Site Infection Prevention

## 2015-11-02 NOTE — Progress Notes (Signed)
Pt. Aware of need to stop fish oil, aspirin & not to use any NSAIDS from now until surgery.

## 2015-11-02 NOTE — Progress Notes (Signed)
Pt. Reports that he has been instructed to return tomorrow for PFT & dopplers.

## 2015-11-03 ENCOUNTER — Ambulatory Visit (HOSPITAL_BASED_OUTPATIENT_CLINIC_OR_DEPARTMENT_OTHER)
Admission: RE | Admit: 2015-11-03 | Discharge: 2015-11-03 | Disposition: A | Discharge status: Home / Self Care | Payer: Medicare Other | Source: Ambulatory Visit | Attending: Thoracic Surgery (Cardiothoracic Vascular Surgery) | Admitting: Thoracic Surgery (Cardiothoracic Vascular Surgery)

## 2015-11-03 ENCOUNTER — Ambulatory Visit (HOSPITAL_COMMUNITY)
Admission: RE | Admit: 2015-11-03 | Discharge: 2015-11-03 | Disposition: A | Discharge status: Home / Self Care | Payer: Medicare Other | Source: Ambulatory Visit | Attending: Thoracic Surgery (Cardiothoracic Vascular Surgery) | Admitting: Thoracic Surgery (Cardiothoracic Vascular Surgery)

## 2015-11-03 DIAGNOSIS — I2510 Atherosclerotic heart disease of native coronary artery without angina pectoris: Secondary | ICD-10-CM | POA: Diagnosis not present

## 2015-11-03 LAB — PULMONARY FUNCTION TEST
DL/VA % pred: 99 %
DL/VA: 4.33 ml/min/mmHg/L
DLCO COR % PRED: 92 %
DLCO COR: 24.91 ml/min/mmHg
DLCO UNC % PRED: 87 %
DLCO UNC: 23.55 ml/min/mmHg
FEF 25-75 POST: 2.9 L/s
FEF 25-75 PRE: 2.53 L/s
FEF2575-%Change-Post: 14 %
FEF2575-%PRED-POST: 134 %
FEF2575-%PRED-PRE: 117 %
FEV1-%CHANGE-POST: 2 %
FEV1-%PRED-POST: 110 %
FEV1-%Pred-Pre: 107 %
FEV1-PRE: 3 L
FEV1-Post: 3.09 L
FEV1FVC-%Change-Post: 2 %
FEV1FVC-%PRED-PRE: 105 %
FEV6-%Change-Post: 1 %
FEV6-%PRED-POST: 108 %
FEV6-%Pred-Pre: 106 %
FEV6-Post: 3.86 L
FEV6-Pre: 3.8 L
FEV6FVC-%CHANGE-POST: 1 %
FEV6FVC-%Pred-Post: 106 %
FEV6FVC-%Pred-Pre: 104 %
FVC-%Change-Post: 0 %
FVC-%Pred-Post: 101 %
FVC-%Pred-Pre: 101 %
FVC-Post: 3.87 L
FVC-Pre: 3.86 L
POST FEV1/FVC RATIO: 80 %
Post FEV6/FVC ratio: 100 %
Pre FEV1/FVC ratio: 78 %
Pre FEV6/FVC Ratio: 99 %
RV % pred: 94 %
RV: 2.08 L
TLC % pred: 99 %
TLC: 6.2 L

## 2015-11-03 LAB — HEMOGLOBIN A1C
HEMOGLOBIN A1C: 6.1 % — AB (ref 4.8–5.6)
MEAN PLASMA GLUCOSE: 128 mg/dL

## 2015-11-03 MED ORDER — SODIUM CHLORIDE 0.9 % IV SOLN
INTRAVENOUS | Status: AC
  Administered 2015-11-04: .9 [IU]/h via INTRAVENOUS
  Filled 2015-11-03: qty 2.5

## 2015-11-03 MED ORDER — SODIUM CHLORIDE 0.9 % IV SOLN
INTRAVENOUS | Status: DC
  Filled 2015-11-03: qty 30

## 2015-11-03 MED ORDER — PLASMA-LYTE 148 IV SOLN
INTRAVENOUS | Status: AC
  Administered 2015-11-04: 08:00:00
  Filled 2015-11-03: qty 2.5

## 2015-11-03 MED ORDER — NITROGLYCERIN IN D5W 200-5 MCG/ML-% IV SOLN
2.0000 ug/min | INTRAVENOUS | Status: DC
  Filled 2015-11-03: qty 250

## 2015-11-03 MED ORDER — CHLORHEXIDINE GLUCONATE 0.12 % MT SOLN
15.0000 mL | Freq: Once | OROMUCOSAL | Status: AC
  Administered 2015-11-04: 15 mL via OROMUCOSAL
  Filled 2015-11-03: qty 15

## 2015-11-03 MED ORDER — METOPROLOL TARTRATE 12.5 MG HALF TABLET: 12.5000 mg | ORAL_TABLET | Freq: Once | ORAL | Status: DC

## 2015-11-03 MED ORDER — POTASSIUM CHLORIDE 2 MEQ/ML IV SOLN
80.0000 meq | INTRAVENOUS | Status: DC
  Filled 2015-11-03: qty 40

## 2015-11-03 MED ORDER — DOPAMINE-DEXTROSE 3.2-5 MG/ML-% IV SOLN
0.0000 ug/kg/min | INTRAVENOUS | Status: DC
  Filled 2015-11-03: qty 250

## 2015-11-03 MED ORDER — VANCOMYCIN HCL 1000 MG IV SOLR
INTRAVENOUS | Status: DC
  Filled 2015-11-03: qty 1000

## 2015-11-03 MED ORDER — DEXTROSE 5 % IV SOLN
0.0000 ug/min | INTRAVENOUS | Status: DC
  Filled 2015-11-03: qty 4

## 2015-11-03 MED ORDER — ALBUTEROL SULFATE (2.5 MG/3ML) 0.083% IN NEBU
2.5000 mg | INHALATION_SOLUTION | Freq: Once | RESPIRATORY_TRACT | Status: AC
  Administered 2015-11-03: 2.5 mg via RESPIRATORY_TRACT

## 2015-11-03 MED ORDER — CHLORHEXIDINE GLUCONATE 4 % EX LIQD: 30.0000 mL | CUTANEOUS | Status: DC

## 2015-11-03 MED ORDER — DEXTROSE 5 % IV SOLN
1.5000 g | INTRAVENOUS | Status: AC
  Administered 2015-11-04: .75 g via INTRAVENOUS
  Administered 2015-11-04: 1.5 g via INTRAVENOUS
  Filled 2015-11-03 (×2): qty 1.5

## 2015-11-03 MED ORDER — DEXMEDETOMIDINE HCL IN NACL 400 MCG/100ML IV SOLN
0.1000 ug/kg/h | INTRAVENOUS | Status: AC
  Administered 2015-11-04: .2 ug/kg/h via INTRAVENOUS
  Filled 2015-11-03: qty 100

## 2015-11-03 MED ORDER — MAGNESIUM SULFATE 50 % IJ SOLN
40.0000 meq | INTRAMUSCULAR | Status: DC
  Filled 2015-11-03: qty 10

## 2015-11-03 MED ORDER — DEXTROSE 5 % IV SOLN
750.0000 mg | INTRAVENOUS | Status: DC
  Filled 2015-11-03: qty 750

## 2015-11-03 MED ORDER — VANCOMYCIN HCL 10 G IV SOLR
1500.0000 mg | INTRAVENOUS | Status: AC
  Administered 2015-11-04: 1500 mg via INTRAVENOUS
  Filled 2015-11-03: qty 1500

## 2015-11-03 MED ORDER — PHENYLEPHRINE HCL 10 MG/ML IJ SOLN
30.0000 ug/min | INTRAVENOUS | Status: AC
  Administered 2015-11-04: 40 ug/min via INTRAVENOUS
  Filled 2015-11-03: qty 2

## 2015-11-03 MED ORDER — SODIUM CHLORIDE 0.9 % IV SOLN
INTRAVENOUS | Status: AC
  Administered 2015-11-04: 14 mL/h via INTRAVENOUS
  Filled 2015-11-03: qty 40

## 2015-11-03 NOTE — Progress Notes (Signed)
Pre-op Cardiac Surgery  Carotid Findings:  Findings consistent with 1- 39 percent stenosis involving the right internal carotid artery and the left internal carotid artery.  Bilaterally vertebral artery are antegrade.  Upper Extremity Right Left  Brachial Pressures 120 Triphasic 112 Triphasic  Radial Waveforms Triphasic Triphasic  Ulnar Waveforms Triphasic Triphasic  Palmar Arch (Allen's Test) Normal Normal   Findings: Palmer Arch evaluation waveforms remained normal with both radial and ulnar arteries with compression at rest.    Lower  Extremity Right Left  Dorsalis Pedis 147 Triphasic 135 Triphasic  Posterior Tibial 134 Triphasic 156 Triphasic  Ankle/Brachial Indices 1.23 1.30    Findings:   ABI's and Doppler waveforms bilaterally are within normal limits.    Jenetta Logesami Oreoluwa Gilmer, RVT RDMS

## 2015-11-03 NOTE — Anesthesia Preprocedure Evaluation (Addendum)
Anesthesia Evaluation  Patient identified by MRN, date of birth, ID band Patient awake    Reviewed: Allergy & Precautions, NPO status , Patient's Chart, lab work & pertinent test results  History of Anesthesia Complications Negative for: history of anesthetic complications  Airway Mallampati: II  TM Distance: >3 FB Neck ROM: Full    Dental  (+) Teeth Intact, Dental Advisory Given   Pulmonary neg pulmonary ROS,    Pulmonary exam normal breath sounds clear to auscultation       Cardiovascular hypertension, Pt. on medications and Pt. on home beta blockers + angina + CAD  Normal cardiovascular exam Rhythm:Regular Rate:Normal  LHC 10/28/15: Severe diffuse left coronary calcification. 50% eccentric ostial left main and 60% distal left main. Diffuse greater than 80% proximal to mid LAD. Heavily calcified eccentric 80-90% stenosis in the ostial to proximal circumflex. 40% proximal RCA Low normal LV systolic function with normal LVEDP   Neuro/Psych negative neurological ROS  negative psych ROS   GI/Hepatic Neg liver ROS, GERD  Medicated,  Endo/Other  negative endocrine ROS  Renal/GU Renal InsufficiencyRenal diseasenegative Renal ROS     Musculoskeletal Herniated lumbar disc    Abdominal   Peds  Hematology  (+) Blood dyscrasia, anemia ,   Anesthesia Other Findings Day of surgery medications reviewed with the patient.  Reproductive/Obstetrics                         Anesthesia Physical Anesthesia Plan  ASA: IV  Anesthesia Plan: General   Post-op Pain Management:    Induction: Intravenous  Airway Management Planned: Oral ETT  Additional Equipment: Arterial line, CVP, TEE, PA Cath and Ultrasound Guidance Line Placement  Intra-op Plan:   Post-operative Plan: Post-operative intubation/ventilation  Informed Consent: I have reviewed the patients History and Physical, chart, labs and  discussed the procedure including the risks, benefits and alternatives for the proposed anesthesia with the patient or authorized representative who has indicated his/her understanding and acceptance.   Dental advisory given  Plan Discussed with: CRNA  Anesthesia Plan Comments: (Risks/benefits of general anesthesia discussed with patient including risk of damage to teeth, lips, gum, and tongue, nausea/vomiting, allergic reactions to medications, and the possibility of heart attack, stroke and death.  All patient questions answered.  Patient wishes to proceed.)        Anesthesia Quick Evaluation

## 2015-11-04 ENCOUNTER — Inpatient Hospital Stay (HOSPITAL_COMMUNITY): Payer: Medicare Other | Admitting: Vascular Surgery

## 2015-11-04 ENCOUNTER — Inpatient Hospital Stay (HOSPITAL_COMMUNITY)
Admission: RE | Admit: 2015-11-04 | Discharge: 2015-11-08 | DRG: 236 | Disposition: A | Discharge status: Home / Self Care | Payer: Medicare Other | Source: Ambulatory Visit | Attending: Thoracic Surgery (Cardiothoracic Vascular Surgery) | Admitting: Thoracic Surgery (Cardiothoracic Vascular Surgery)

## 2015-11-04 ENCOUNTER — Inpatient Hospital Stay (HOSPITAL_COMMUNITY): Payer: Medicare Other

## 2015-11-04 ENCOUNTER — Encounter (HOSPITAL_COMMUNITY)
Admission: RE | Disposition: A | Discharge status: Home / Self Care | Payer: Self-pay | Source: Ambulatory Visit | Attending: Thoracic Surgery (Cardiothoracic Vascular Surgery)

## 2015-11-04 ENCOUNTER — Inpatient Hospital Stay (HOSPITAL_COMMUNITY): Payer: Medicare Other | Admitting: Anesthesiology

## 2015-11-04 ENCOUNTER — Encounter (HOSPITAL_COMMUNITY): Payer: Self-pay | Admitting: *Deleted

## 2015-11-04 DIAGNOSIS — E785 Hyperlipidemia, unspecified: Secondary | ICD-10-CM | POA: Diagnosis present

## 2015-11-04 DIAGNOSIS — E1122 Type 2 diabetes mellitus with diabetic chronic kidney disease: Secondary | ICD-10-CM | POA: Diagnosis present

## 2015-11-04 DIAGNOSIS — D696 Thrombocytopenia, unspecified: Secondary | ICD-10-CM | POA: Diagnosis not present

## 2015-11-04 DIAGNOSIS — I129 Hypertensive chronic kidney disease with stage 1 through stage 4 chronic kidney disease, or unspecified chronic kidney disease: Secondary | ICD-10-CM | POA: Diagnosis present

## 2015-11-04 DIAGNOSIS — Z981 Arthrodesis status: Secondary | ICD-10-CM | POA: Diagnosis not present

## 2015-11-04 DIAGNOSIS — E8770 Fluid overload, unspecified: Secondary | ICD-10-CM | POA: Diagnosis present

## 2015-11-04 DIAGNOSIS — Z79899 Other long term (current) drug therapy: Secondary | ICD-10-CM

## 2015-11-04 DIAGNOSIS — Z951 Presence of aortocoronary bypass graft: Secondary | ICD-10-CM

## 2015-11-04 DIAGNOSIS — D62 Acute posthemorrhagic anemia: Secondary | ICD-10-CM | POA: Diagnosis not present

## 2015-11-04 DIAGNOSIS — N183 Chronic kidney disease, stage 3 unspecified: Secondary | ICD-10-CM | POA: Diagnosis present

## 2015-11-04 DIAGNOSIS — K219 Gastro-esophageal reflux disease without esophagitis: Secondary | ICD-10-CM | POA: Diagnosis present

## 2015-11-04 DIAGNOSIS — I2089 Other forms of angina pectoris: Secondary | ICD-10-CM | POA: Diagnosis present

## 2015-11-04 DIAGNOSIS — J9811 Atelectasis: Secondary | ICD-10-CM | POA: Diagnosis not present

## 2015-11-04 DIAGNOSIS — I25110 Atherosclerotic heart disease of native coronary artery with unstable angina pectoris: Secondary | ICD-10-CM

## 2015-11-04 DIAGNOSIS — I25119 Atherosclerotic heart disease of native coronary artery with unspecified angina pectoris: Secondary | ICD-10-CM | POA: Diagnosis present

## 2015-11-04 DIAGNOSIS — I208 Other forms of angina pectoris: Secondary | ICD-10-CM | POA: Diagnosis present

## 2015-11-04 DIAGNOSIS — R079 Chest pain, unspecified: Secondary | ICD-10-CM | POA: Diagnosis present

## 2015-11-04 DIAGNOSIS — E872 Acidosis: Secondary | ICD-10-CM | POA: Diagnosis present

## 2015-11-04 DIAGNOSIS — M109 Gout, unspecified: Secondary | ICD-10-CM | POA: Diagnosis present

## 2015-11-04 DIAGNOSIS — I340 Nonrheumatic mitral (valve) insufficiency: Secondary | ICD-10-CM | POA: Diagnosis present

## 2015-11-04 DIAGNOSIS — Z7982 Long term (current) use of aspirin: Secondary | ICD-10-CM

## 2015-11-04 DIAGNOSIS — I2510 Atherosclerotic heart disease of native coronary artery without angina pectoris: Secondary | ICD-10-CM | POA: Diagnosis present

## 2015-11-04 HISTORY — PX: TEE WITHOUT CARDIOVERSION: SHX5443

## 2015-11-04 HISTORY — DX: Presence of aortocoronary bypass graft: Z95.1

## 2015-11-04 HISTORY — DX: Chronic kidney disease, stage 3 (moderate): N18.3

## 2015-11-04 HISTORY — DX: Chronic kidney disease, stage 3 unspecified: N18.30

## 2015-11-04 HISTORY — PX: CORONARY ARTERY BYPASS GRAFT: SHX141

## 2015-11-04 LAB — CBC
HCT: 30.4 % — ABNORMAL LOW (ref 39.0–52.0)
HEMATOCRIT: 32.9 % — AB (ref 39.0–52.0)
Hemoglobin: 10.5 g/dL — ABNORMAL LOW (ref 13.0–17.0)
Hemoglobin: 9.5 g/dL — ABNORMAL LOW (ref 13.0–17.0)
MCH: 27.2 pg (ref 26.0–34.0)
MCH: 26.7 pg (ref 26.0–34.0)
MCHC: 31.9 g/dL (ref 30.0–36.0)
MCHC: 31.3 g/dL (ref 30.0–36.0)
MCV: 85.2 fL (ref 78.0–100.0)
MCV: 85.4 fL (ref 78.0–100.0)
PLATELETS: 104 10*3/uL — AB (ref 150–400)
PLATELETS: 92 10*3/uL — AB (ref 150–400)
RBC: 3.86 MIL/uL — ABNORMAL LOW (ref 4.22–5.81)
RBC: 3.56 MIL/uL — ABNORMAL LOW (ref 4.22–5.81)
RDW: 16 % — AB (ref 11.5–15.5)
RDW: 15.9 % — AB (ref 11.5–15.5)
WBC: 16.1 10*3/uL — AB (ref 4.0–10.5)
WBC: 11.3 10*3/uL — ABNORMAL HIGH (ref 4.0–10.5)

## 2015-11-04 LAB — POCT I-STAT, CHEM 8
BUN: 24 mg/dL — AB (ref 6–20)
BUN: 16 mg/dL (ref 6–20)
BUN: 18 mg/dL (ref 6–20)
BUN: 18 mg/dL (ref 6–20)
BUN: 17 mg/dL (ref 6–20)
BUN: 16 mg/dL (ref 6–20)
BUN: 17 mg/dL (ref 6–20)
CALCIUM ION: 1.21 mmol/L (ref 1.13–1.30)
CALCIUM ION: 1.03 mmol/L — AB (ref 1.13–1.30)
CALCIUM ION: 1.25 mmol/L (ref 1.13–1.30)
CALCIUM ION: 1.08 mmol/L — AB (ref 1.13–1.30)
CHLORIDE: 107 mmol/L (ref 101–111)
CHLORIDE: 105 mmol/L (ref 101–111)
CHLORIDE: 105 mmol/L (ref 101–111)
CHLORIDE: 105 mmol/L (ref 101–111)
CREATININE: 1.5 mg/dL — AB (ref 0.61–1.24)
CREATININE: 1.2 mg/dL (ref 0.61–1.24)
CREATININE: 1.4 mg/dL — AB (ref 0.61–1.24)
CREATININE: 1.4 mg/dL — AB (ref 0.61–1.24)
CREATININE: 1.4 mg/dL — AB (ref 0.61–1.24)
Calcium, Ion: 1.25 mmol/L (ref 1.13–1.30)
Calcium, Ion: 1.12 mmol/L — ABNORMAL LOW (ref 1.13–1.30)
Calcium, Ion: 1.13 mmol/L (ref 1.13–1.30)
Chloride: 101 mmol/L (ref 101–111)
Chloride: 106 mmol/L (ref 101–111)
Chloride: 105 mmol/L (ref 101–111)
Creatinine, Ser: 1.5 mg/dL — ABNORMAL HIGH (ref 0.61–1.24)
Creatinine, Ser: 1.4 mg/dL — ABNORMAL HIGH (ref 0.61–1.24)
GLUCOSE: 105 mg/dL — AB (ref 65–99)
GLUCOSE: 110 mg/dL — AB (ref 65–99)
GLUCOSE: 98 mg/dL (ref 65–99)
Glucose, Bld: 104 mg/dL — ABNORMAL HIGH (ref 65–99)
Glucose, Bld: 133 mg/dL — ABNORMAL HIGH (ref 65–99)
Glucose, Bld: 158 mg/dL — ABNORMAL HIGH (ref 65–99)
Glucose, Bld: 153 mg/dL — ABNORMAL HIGH (ref 65–99)
HCT: 36 % — ABNORMAL LOW (ref 39.0–52.0)
HCT: 28 % — ABNORMAL LOW (ref 39.0–52.0)
HCT: 34 % — ABNORMAL LOW (ref 39.0–52.0)
HCT: 35 % — ABNORMAL LOW (ref 39.0–52.0)
HCT: 28 % — ABNORMAL LOW (ref 39.0–52.0)
HEMATOCRIT: 26 % — AB (ref 39.0–52.0)
HEMATOCRIT: 30 % — AB (ref 39.0–52.0)
HEMOGLOBIN: 11.6 g/dL — AB (ref 13.0–17.0)
Hemoglobin: 12.2 g/dL — ABNORMAL LOW (ref 13.0–17.0)
Hemoglobin: 11.9 g/dL — ABNORMAL LOW (ref 13.0–17.0)
Hemoglobin: 9.5 g/dL — ABNORMAL LOW (ref 13.0–17.0)
Hemoglobin: 9.5 g/dL — ABNORMAL LOW (ref 13.0–17.0)
Hemoglobin: 8.8 g/dL — ABNORMAL LOW (ref 13.0–17.0)
Hemoglobin: 10.2 g/dL — ABNORMAL LOW (ref 13.0–17.0)
POTASSIUM: 4.6 mmol/L (ref 3.5–5.1)
POTASSIUM: 5.4 mmol/L — AB (ref 3.5–5.1)
POTASSIUM: 5.5 mmol/L — AB (ref 3.5–5.1)
Potassium: 4.6 mmol/L (ref 3.5–5.1)
Potassium: 4.5 mmol/L (ref 3.5–5.1)
Potassium: 4.7 mmol/L (ref 3.5–5.1)
Potassium: 4.7 mmol/L (ref 3.5–5.1)
SODIUM: 139 mmol/L (ref 135–145)
SODIUM: 139 mmol/L (ref 135–145)
SODIUM: 141 mmol/L (ref 135–145)
Sodium: 142 mmol/L (ref 135–145)
Sodium: 141 mmol/L (ref 135–145)
Sodium: 141 mmol/L (ref 135–145)
Sodium: 139 mmol/L (ref 135–145)
TCO2: 29 mmol/L (ref 0–100)
TCO2: 26 mmol/L (ref 0–100)
TCO2: 28 mmol/L (ref 0–100)
TCO2: 29 mmol/L (ref 0–100)
TCO2: 24 mmol/L (ref 0–100)
TCO2: 22 mmol/L (ref 0–100)
TCO2: 24 mmol/L (ref 0–100)

## 2015-11-04 LAB — POCT I-STAT 3, ART BLOOD GAS (G3+)
ACID-BASE DEFICIT: 6 mmol/L — AB (ref 0.0–2.0)
BICARBONATE: 25.8 meq/L — AB (ref 20.0–24.0)
Bicarbonate: 25.8 mEq/L — ABNORMAL HIGH (ref 20.0–24.0)
Bicarbonate: 20.3 mEq/L (ref 20.0–24.0)
O2 SAT: 100 %
O2 SAT: 91 %
O2 Saturation: 94 %
PCO2 ART: 48.4 mmHg — AB (ref 35.0–45.0)
PH ART: 7.267 — AB (ref 7.350–7.450)
PH ART: 7.372 (ref 7.350–7.450)
TCO2: 27 mmol/L (ref 0–100)
TCO2: 22 mmol/L (ref 0–100)
TCO2: 27 mmol/L (ref 0–100)
pCO2 arterial: 44.2 mmHg (ref 35.0–45.0)
pCO2 arterial: 44.1 mmHg (ref 35.0–45.0)
pH, Arterial: 7.335 — ABNORMAL LOW (ref 7.350–7.450)
pO2, Arterial: 278 mmHg — ABNORMAL HIGH (ref 80.0–100.0)
pO2, Arterial: 67 mmHg — ABNORMAL LOW (ref 80.0–100.0)
pO2, Arterial: 72 mmHg — ABNORMAL LOW (ref 80.0–100.0)

## 2015-11-04 LAB — HEMOGLOBIN AND HEMATOCRIT, BLOOD
HCT: 27.3 % — ABNORMAL LOW (ref 39.0–52.0)
HEMOGLOBIN: 8.7 g/dL — AB (ref 13.0–17.0)

## 2015-11-04 LAB — CREATININE, SERUM
CREATININE: 1.65 mg/dL — AB (ref 0.61–1.24)
GFR, EST AFRICAN AMERICAN: 47 mL/min — AB (ref >60)
GFR, EST NON AFRICAN AMERICAN: 41 mL/min — AB (ref >60)

## 2015-11-04 LAB — APTT: APTT: 28 s (ref 24–37)

## 2015-11-04 LAB — MAGNESIUM: MAGNESIUM: 2.9 mg/dL — AB (ref 1.7–2.4)

## 2015-11-04 LAB — PLATELET COUNT: Platelets: 123 10*3/uL — ABNORMAL LOW (ref 150–400)

## 2015-11-04 LAB — PROTIME-INR
INR: 1.42 (ref 0.00–1.49)
PROTHROMBIN TIME: 17.4 s — AB (ref 11.6–15.2)

## 2015-11-04 SURGERY — CORONARY ARTERY BYPASS GRAFTING (CABG)
Anesthesia: General | Site: Chest

## 2015-11-04 MED ORDER — CHLORHEXIDINE GLUCONATE 0.12 % MT SOLN
15.0000 mL | OROMUCOSAL | Status: AC
  Administered 2015-11-04: 15 mL via OROMUCOSAL
  Filled 2015-11-04: qty 15

## 2015-11-04 MED ORDER — DOCUSATE SODIUM 100 MG PO CAPS
200.0000 mg | ORAL_CAPSULE | Freq: Every day | ORAL | Status: DC
  Administered 2015-11-05 – 2015-11-07 (×3): 200 mg via ORAL
  Filled 2015-11-04 (×5): qty 2

## 2015-11-04 MED ORDER — ANTISEPTIC ORAL RINSE SOLUTION (CORINZ): 7.0000 mL | Freq: Four times a day (QID) | OROMUCOSAL | Status: DC

## 2015-11-04 MED ORDER — METOPROLOL TARTRATE 12.5 MG HALF TABLET: 12.5000 mg | ORAL_TABLET | Freq: Two times a day (BID) | ORAL | Status: DC

## 2015-11-04 MED ORDER — ASPIRIN EC 325 MG PO TBEC
325.0000 mg | DELAYED_RELEASE_TABLET | Freq: Every day | ORAL | Status: DC
  Administered 2015-11-05: 325 mg via ORAL
  Filled 2015-11-04: qty 1

## 2015-11-04 MED ORDER — ARTIFICIAL TEARS OP OINT
TOPICAL_OINTMENT | OPHTHALMIC | Status: DC | PRN
  Administered 2015-11-04: 1 via OPHTHALMIC

## 2015-11-04 MED ORDER — LACTATED RINGERS IV SOLN
INTRAVENOUS | Status: DC | PRN
  Administered 2015-11-04: 09:00:00 via INTRAVENOUS

## 2015-11-04 MED ORDER — ONDANSETRON HCL 4 MG/2ML IJ SOLN
INTRAMUSCULAR | Status: AC
  Filled 2015-11-04: qty 2

## 2015-11-04 MED ORDER — VECURONIUM BROMIDE 10 MG IV SOLR
INTRAVENOUS | Status: AC
  Filled 2015-11-04: qty 10

## 2015-11-04 MED ORDER — ROCURONIUM BROMIDE 100 MG/10ML IV SOLN
INTRAVENOUS | Status: DC | PRN
  Administered 2015-11-04: 50 mg via INTRAVENOUS

## 2015-11-04 MED ORDER — PROTAMINE SULFATE 10 MG/ML IV SOLN
INTRAVENOUS | Status: AC
  Filled 2015-11-04: qty 50

## 2015-11-04 MED ORDER — SODIUM CHLORIDE 0.9 % IV SOLN: 250.0000 mL | INTRAVENOUS | Status: DC

## 2015-11-04 MED ORDER — LIDOCAINE HCL (CARDIAC) 20 MG/ML IV SOLN
INTRAVENOUS | Status: DC | PRN
  Administered 2015-11-04: 100 mg via INTRAVENOUS

## 2015-11-04 MED ORDER — LACTATED RINGERS IV SOLN: 500.0000 mL | Freq: Once | INTRAVENOUS | Status: DC | PRN

## 2015-11-04 MED ORDER — VANCOMYCIN HCL IN DEXTROSE 1-5 GM/200ML-% IV SOLN
1000.0000 mg | Freq: Once | INTRAVENOUS | Status: AC
  Administered 2015-11-04: 1000 mg via INTRAVENOUS
  Filled 2015-11-04: qty 200

## 2015-11-04 MED ORDER — MIDAZOLAM HCL 2 MG/2ML IJ SOLN
2.0000 mg | INTRAMUSCULAR | Status: DC | PRN
Start: 2015-11-04 — End: 2015-11-05

## 2015-11-04 MED ORDER — VECURONIUM BROMIDE 10 MG IV SOLR
INTRAVENOUS | Status: DC | PRN
  Administered 2015-11-04 (×3): 5 mg via INTRAVENOUS

## 2015-11-04 MED ORDER — METOPROLOL TARTRATE 25 MG/10 ML ORAL SUSPENSION
12.5000 mg | Freq: Two times a day (BID) | ORAL | Status: DC
Start: 2015-11-04 — End: 2015-11-05

## 2015-11-04 MED ORDER — LACTATED RINGERS IV SOLN
INTRAVENOUS | Status: DC | PRN
  Administered 2015-11-04: 07:00:00 via INTRAVENOUS

## 2015-11-04 MED ORDER — METOPROLOL TARTRATE 1 MG/ML IV SOLN
2.5000 mg | INTRAVENOUS | Status: DC | PRN
Start: 2015-11-04 — End: 2015-11-08

## 2015-11-04 MED ORDER — VANCOMYCIN HCL 1000 MG IV SOLR
INTRAVENOUS | Status: DC | PRN
  Administered 2015-11-04: 11:00:00

## 2015-11-04 MED ORDER — PROPOFOL 10 MG/ML IV BOLUS
INTRAVENOUS | Status: AC
  Filled 2015-11-04: qty 20

## 2015-11-04 MED ORDER — STERILE WATER FOR INJECTION IJ SOLN
INTRAMUSCULAR | Status: AC
  Filled 2015-11-04: qty 30

## 2015-11-04 MED ORDER — SODIUM CHLORIDE 0.9% FLUSH: 3.0000 mL | INTRAVENOUS | Status: DC | PRN

## 2015-11-04 MED ORDER — LACTATED RINGERS IV SOLN: INTRAVENOUS | Status: DC

## 2015-11-04 MED ORDER — ACETAMINOPHEN 500 MG PO TABS
1000.0000 mg | ORAL_TABLET | Freq: Four times a day (QID) | ORAL | Status: DC
  Administered 2015-11-04 – 2015-11-08 (×15): 1000 mg via ORAL
  Filled 2015-11-04 (×15): qty 2

## 2015-11-04 MED ORDER — DEXTROSE 5 % IV SOLN
0.0000 ug/min | INTRAVENOUS | Status: DC
  Filled 2015-11-04 (×2): qty 2

## 2015-11-04 MED ORDER — HEMOSTATIC AGENTS (NO CHARGE) OPTIME
TOPICAL | Status: DC | PRN
  Administered 2015-11-04: 2 via TOPICAL

## 2015-11-04 MED ORDER — LIDOCAINE HCL (CARDIAC) 20 MG/ML IV SOLN
INTRAVENOUS | Status: AC
  Filled 2015-11-04: qty 5

## 2015-11-04 MED ORDER — SODIUM CHLORIDE 0.9 % IV SOLN
INTRAVENOUS | Status: DC
  Administered 2015-11-04: 20 mL/h via INTRAVENOUS

## 2015-11-04 MED ORDER — PROTAMINE SULFATE 10 MG/ML IV SOLN
INTRAVENOUS | Status: DC | PRN
  Administered 2015-11-04: 210 mg via INTRAVENOUS

## 2015-11-04 MED ORDER — LACTATED RINGERS IV SOLN
INTRAVENOUS | Status: DC
  Administered 2015-11-04: 20 mL/h via INTRAVENOUS

## 2015-11-04 MED ORDER — KETOROLAC TROMETHAMINE 30 MG/ML IJ SOLN
INTRAMUSCULAR | Status: AC
  Filled 2015-11-04: qty 1

## 2015-11-04 MED ORDER — CHLORHEXIDINE GLUCONATE 0.12% ORAL RINSE (MEDLINE KIT): 15.0000 mL | Freq: Two times a day (BID) | OROMUCOSAL | Status: DC

## 2015-11-04 MED ORDER — BISACODYL 5 MG PO TBEC
10.0000 mg | DELAYED_RELEASE_TABLET | Freq: Every day | ORAL | Status: DC
Start: 2015-11-05 — End: 2015-11-08
  Administered 2015-11-05 – 2015-11-07 (×3): 10 mg via ORAL
  Filled 2015-11-04 (×4): qty 2

## 2015-11-04 MED ORDER — SODIUM CHLORIDE 0.9 % IV SOLN
INTRAVENOUS | Status: AC
  Administered 2015-11-04: 100 mL/h via INTRAVENOUS

## 2015-11-04 MED ORDER — ONDANSETRON HCL 4 MG/2ML IJ SOLN
4.0000 mg | Freq: Four times a day (QID) | INTRAMUSCULAR | Status: DC | PRN
  Administered 2015-11-04: 4 mg via INTRAVENOUS
  Filled 2015-11-04: qty 2

## 2015-11-04 MED ORDER — DEXTROSE 5 % IV SOLN
1.5000 g | Freq: Two times a day (BID) | INTRAVENOUS | Status: AC
  Administered 2015-11-04 – 2015-11-06 (×4): 1.5 g via INTRAVENOUS
  Filled 2015-11-04 (×4): qty 1.5

## 2015-11-04 MED ORDER — CALCIUM CHLORIDE 10 % IV SOLN
INTRAVENOUS | Status: DC | PRN
  Administered 2015-11-04: 250 mg via INTRAVENOUS

## 2015-11-04 MED ORDER — DEXAMETHASONE SODIUM PHOSPHATE 4 MG/ML IJ SOLN
INTRAMUSCULAR | Status: AC
  Filled 2015-11-04: qty 2

## 2015-11-04 MED ORDER — MUPIROCIN 2 % EX OINT
1.0000 "application " | TOPICAL_OINTMENT | Freq: Two times a day (BID) | CUTANEOUS | Status: DC
  Administered 2015-11-04 – 2015-11-08 (×8): 1 via NASAL
  Filled 2015-11-04 (×4): qty 22

## 2015-11-04 MED ORDER — MIDAZOLAM HCL 5 MG/5ML IJ SOLN
INTRAMUSCULAR | Status: DC | PRN
  Administered 2015-11-04 (×2): 1 mg via INTRAVENOUS
  Administered 2015-11-04: 3 mg via INTRAVENOUS
  Administered 2015-11-04: 2 mg via INTRAVENOUS
  Administered 2015-11-04: 3 mg via INTRAVENOUS

## 2015-11-04 MED ORDER — MAGNESIUM SULFATE 4 GM/100ML IV SOLN
4.0000 g | Freq: Once | INTRAVENOUS | Status: AC
  Administered 2015-11-04: 4 g via INTRAVENOUS
  Filled 2015-11-04: qty 100

## 2015-11-04 MED ORDER — CHLORHEXIDINE GLUCONATE CLOTH 2 % EX PADS
6.0000 | MEDICATED_PAD | Freq: Every day | CUTANEOUS | Status: DC
  Administered 2015-11-05 – 2015-11-07 (×3): 6 via TOPICAL

## 2015-11-04 MED ORDER — ROCURONIUM BROMIDE 50 MG/5ML IV SOLN
INTRAVENOUS | Status: AC
  Filled 2015-11-04: qty 1

## 2015-11-04 MED ORDER — AMINOCAPROIC ACID 250 MG/ML IV SOLN
INTRAVENOUS | Status: DC | PRN
  Administered 2015-11-04: 5 g via INTRAVENOUS

## 2015-11-04 MED ORDER — FAMOTIDINE IN NACL 20-0.9 MG/50ML-% IV SOLN
20.0000 mg | Freq: Two times a day (BID) | INTRAVENOUS | Status: AC
  Administered 2015-11-04: 20 mg via INTRAVENOUS

## 2015-11-04 MED ORDER — BISACODYL 10 MG RE SUPP: 10.0000 mg | Freq: Every day | RECTAL | Status: DC

## 2015-11-04 MED ORDER — HEPARIN SODIUM (PORCINE) 1000 UNIT/ML IJ SOLN
INTRAMUSCULAR | Status: DC | PRN
  Administered 2015-11-04: 25000 [IU] via INTRAVENOUS

## 2015-11-04 MED ORDER — KETOROLAC TROMETHAMINE 30 MG/ML IJ SOLN
30.0000 mg | Freq: Once | INTRAMUSCULAR | Status: AC
  Administered 2015-11-04: 30 mg via INTRAVENOUS

## 2015-11-04 MED ORDER — PANTOPRAZOLE SODIUM 40 MG PO TBEC
40.0000 mg | DELAYED_RELEASE_TABLET | Freq: Every day | ORAL | Status: AC
  Administered 2015-11-06: 40 mg via ORAL
  Filled 2015-11-04: qty 1

## 2015-11-04 MED ORDER — HEPARIN SODIUM (PORCINE) 1000 UNIT/ML IJ SOLN
INTRAMUSCULAR | Status: AC
  Filled 2015-11-04: qty 3

## 2015-11-04 MED ORDER — HEPARIN SODIUM (PORCINE) 1000 UNIT/ML IJ SOLN
INTRAMUSCULAR | Status: AC
  Filled 2015-11-04: qty 1

## 2015-11-04 MED ORDER — FENTANYL CITRATE (PF) 250 MCG/5ML IJ SOLN
INTRAMUSCULAR | Status: AC
  Filled 2015-11-04: qty 25

## 2015-11-04 MED ORDER — NITROGLYCERIN IN D5W 200-5 MCG/ML-% IV SOLN: 0.0000 ug/min | INTRAVENOUS | Status: DC

## 2015-11-04 MED ORDER — INSULIN REGULAR BOLUS VIA INFUSION
0.0000 [IU] | Freq: Three times a day (TID) | INTRAVENOUS | Status: DC
  Filled 2015-11-04: qty 10

## 2015-11-04 MED ORDER — VECURONIUM BROMIDE 10 MG IV SOLR
INTRAVENOUS | Status: AC
  Filled 2015-11-04: qty 30

## 2015-11-04 MED ORDER — DEXMEDETOMIDINE HCL IN NACL 200 MCG/50ML IV SOLN
0.0000 ug/kg/h | INTRAVENOUS | Status: DC
  Filled 2015-11-04: qty 50

## 2015-11-04 MED ORDER — ALBUMIN HUMAN 5 % IV SOLN
INTRAVENOUS | Status: DC | PRN
  Administered 2015-11-04: 12:00:00 via INTRAVENOUS

## 2015-11-04 MED ORDER — TRAMADOL HCL 50 MG PO TABS: 50.0000 mg | ORAL_TABLET | ORAL | Status: DC | PRN

## 2015-11-04 MED ORDER — POTASSIUM CHLORIDE 10 MEQ/50ML IV SOLN: 10.0000 meq | INTRAVENOUS | Status: AC

## 2015-11-04 MED ORDER — PROPOFOL 10 MG/ML IV BOLUS
INTRAVENOUS | Status: DC | PRN
  Administered 2015-11-04: 50 mg via INTRAVENOUS

## 2015-11-04 MED ORDER — SODIUM CHLORIDE 0.9 % IV SOLN
INTRAVENOUS | Status: DC
  Administered 2015-11-04: 23:00:00 via INTRAVENOUS
  Filled 2015-11-04 (×2): qty 2.5

## 2015-11-04 MED ORDER — ALBUMIN HUMAN 5 % IV SOLN
250.0000 mL | INTRAVENOUS | Status: AC | PRN
Start: 2015-11-04 — End: 2015-11-04
  Administered 2015-11-04 (×4): 250 mL via INTRAVENOUS
  Filled 2015-11-04 (×2): qty 250

## 2015-11-04 MED ORDER — ACETAMINOPHEN 160 MG/5ML PO SOLN: 1000.0000 mg | Freq: Four times a day (QID) | ORAL | Status: DC

## 2015-11-04 MED ORDER — SODIUM CHLORIDE 0.9 % IJ SOLN
INTRAMUSCULAR | Status: AC
  Filled 2015-11-04: qty 10

## 2015-11-04 MED ORDER — SODIUM CHLORIDE 0.9 % IV SOLN
INTRAVENOUS | Status: DC | PRN
  Administered 2015-11-04: 12:00:00 via INTRAVENOUS

## 2015-11-04 MED ORDER — ASPIRIN 81 MG PO CHEW: 324.0000 mg | CHEWABLE_TABLET | Freq: Every day | ORAL | Status: DC

## 2015-11-04 MED ORDER — ACETAMINOPHEN 650 MG RE SUPP
650.0000 mg | Freq: Once | RECTAL | Status: AC
  Administered 2015-11-04: 650 mg via RECTAL

## 2015-11-04 MED ORDER — SODIUM BICARBONATE 8.4 % IV SOLN
50.0000 meq | Freq: Once | INTRAVENOUS | Status: AC
  Administered 2015-11-04: 50 meq via INTRAVENOUS

## 2015-11-04 MED ORDER — MORPHINE SULFATE (PF) 2 MG/ML IV SOLN
2.0000 mg | INTRAVENOUS | Status: DC | PRN
  Administered 2015-11-04 – 2015-11-05 (×7): 4 mg via INTRAVENOUS
  Filled 2015-11-04 (×2): qty 2
  Filled 2015-11-04: qty 1
  Filled 2015-11-04: qty 2
  Filled 2015-11-04: qty 1
  Filled 2015-11-04 (×3): qty 2

## 2015-11-04 MED ORDER — ACETAMINOPHEN 160 MG/5ML PO SOLN: 650.0000 mg | Freq: Once | ORAL | Status: AC

## 2015-11-04 MED ORDER — SODIUM CHLORIDE 0.45 % IV SOLN
INTRAVENOUS | Status: DC | PRN
  Administered 2015-11-04: 20 mL/h via INTRAVENOUS

## 2015-11-04 MED ORDER — 0.9 % SODIUM CHLORIDE (POUR BTL) OPTIME
TOPICAL | Status: DC | PRN
  Administered 2015-11-04: 1000 mL

## 2015-11-04 MED ORDER — FENTANYL CITRATE (PF) 100 MCG/2ML IJ SOLN
INTRAMUSCULAR | Status: DC | PRN
  Administered 2015-11-04: 200 ug via INTRAVENOUS
  Administered 2015-11-04 (×3): 50 ug via INTRAVENOUS
  Administered 2015-11-04: 300 ug via INTRAVENOUS
  Administered 2015-11-04 (×2): 50 ug via INTRAVENOUS
  Administered 2015-11-04: 250 ug via INTRAVENOUS
  Administered 2015-11-04: 50 ug via INTRAVENOUS
  Administered 2015-11-04: 200 ug via INTRAVENOUS

## 2015-11-04 MED ORDER — MIDAZOLAM HCL 10 MG/2ML IJ SOLN
INTRAMUSCULAR | Status: AC
  Filled 2015-11-04: qty 2

## 2015-11-04 MED ORDER — SODIUM CHLORIDE 0.9% FLUSH
3.0000 mL | Freq: Two times a day (BID) | INTRAVENOUS | Status: DC
  Administered 2015-11-05 – 2015-11-06 (×3): 3 mL via INTRAVENOUS

## 2015-11-04 MED ORDER — MORPHINE SULFATE (PF) 2 MG/ML IV SOLN: 1.0000 mg | INTRAVENOUS | Status: AC | PRN

## 2015-11-04 MED ORDER — OXYCODONE HCL 5 MG PO TABS
5.0000 mg | ORAL_TABLET | ORAL | Status: DC | PRN
  Administered 2015-11-05: 10 mg via ORAL
  Filled 2015-11-04: qty 2

## 2015-11-04 SURGICAL SUPPLY — 107 items
BAG DECANTER FOR FLEXI CONT (MISCELLANEOUS) ×8 IMPLANT
BANDAGE ACE 4X5 VEL STRL LF (GAUZE/BANDAGES/DRESSINGS) ×4 IMPLANT
BANDAGE ACE 6X5 VEL STRL LF (GAUZE/BANDAGES/DRESSINGS) ×4 IMPLANT
BANDAGE ELASTIC 4 VELCRO ST LF (GAUZE/BANDAGES/DRESSINGS) ×4 IMPLANT
BANDAGE ELASTIC 6 VELCRO ST LF (GAUZE/BANDAGES/DRESSINGS) ×4 IMPLANT
BASKET HEART  (ORDER IN 25'S) (MISCELLANEOUS) ×1
BASKET HEART (ORDER IN 25'S) (MISCELLANEOUS) ×1
BASKET HEART (ORDER IN 25S) (MISCELLANEOUS) ×2 IMPLANT
BLADE STERNUM SYSTEM 6 (BLADE) ×4 IMPLANT
BLADE SURG 11 STRL SS (BLADE) ×4 IMPLANT
BLADE SURG ROTATE 9660 (MISCELLANEOUS) IMPLANT
BNDG GAUZE ELAST 4 BULKY (GAUZE/BANDAGES/DRESSINGS) ×4 IMPLANT
CANISTER SUCTION 2500CC (MISCELLANEOUS) ×4 IMPLANT
CANNULA AORTIC ROOT 9FR (CANNULA) ×4 IMPLANT
CANNULA EZ GLIDE AORTIC 21FR (CANNULA) ×8 IMPLANT
CATH CPB KIT OWEN (MISCELLANEOUS) ×4 IMPLANT
CATH THORACIC 36FR (CATHETERS) ×4 IMPLANT
CLIP RETRACTION 3.0MM CORONARY (MISCELLANEOUS) ×4 IMPLANT
CLIP TI MEDIUM 24 (CLIP) IMPLANT
CLIP TI WIDE RED SMALL 24 (CLIP) IMPLANT
CONN ST 1/4X3/8  BEN (MISCELLANEOUS) ×2
CONN ST 1/4X3/8 BEN (MISCELLANEOUS) ×2 IMPLANT
CRADLE DONUT ADULT HEAD (MISCELLANEOUS) ×4 IMPLANT
DRAIN CHANNEL 32F RND 10.7 FF (WOUND CARE) ×8 IMPLANT
DRAPE CARDIOVASCULAR INCISE (DRAPES) ×2
DRAPE INCISE IOBAN 66X45 STRL (DRAPES) ×4 IMPLANT
DRAPE SLUSH/WARMER DISC (DRAPES) ×4 IMPLANT
DRAPE SRG 135X102X78XABS (DRAPES) ×2 IMPLANT
DRSG AQUACEL AG ADV 3.5X14 (GAUZE/BANDAGES/DRESSINGS) ×4 IMPLANT
DRSG COVADERM 4X14 (GAUZE/BANDAGES/DRESSINGS) ×4 IMPLANT
ELECT BLADE 4.0 EZ CLEAN MEGAD (MISCELLANEOUS) ×4
ELECT REM PT RETURN 9FT ADLT (ELECTROSURGICAL) ×8
ELECTRODE BLDE 4.0 EZ CLN MEGD (MISCELLANEOUS) ×2 IMPLANT
ELECTRODE REM PT RTRN 9FT ADLT (ELECTROSURGICAL) ×4 IMPLANT
FELT TEFLON 1X6 (MISCELLANEOUS) ×4 IMPLANT
GAUZE SPONGE 4X4 12PLY STRL (GAUZE/BANDAGES/DRESSINGS) ×8 IMPLANT
GLOVE BIO SURGEON STRL SZ 6.5 (GLOVE) ×18 IMPLANT
GLOVE BIO SURGEONS STRL SZ 6.5 (GLOVE) ×6
GLOVE BIOGEL PI IND STRL 6 (GLOVE) ×6 IMPLANT
GLOVE BIOGEL PI IND STRL 6.5 (GLOVE) ×2 IMPLANT
GLOVE BIOGEL PI INDICATOR 6 (GLOVE) ×6
GLOVE BIOGEL PI INDICATOR 6.5 (GLOVE) ×2
GLOVE ORTHO TXT STRL SZ7.5 (GLOVE) ×8 IMPLANT
GOWN STRL REUS W/ TWL LRG LVL3 (GOWN DISPOSABLE) ×8 IMPLANT
GOWN STRL REUS W/TWL LRG LVL3 (GOWN DISPOSABLE) ×8
HEMOSTAT POWDER SURGIFOAM 1G (HEMOSTASIS) ×12 IMPLANT
INSERT FOGARTY XLG (MISCELLANEOUS) ×4 IMPLANT
KIT BASIN OR (CUSTOM PROCEDURE TRAY) ×4 IMPLANT
KIT ROOM TURNOVER OR (KITS) ×4 IMPLANT
KIT SUCTION CATH 14FR (SUCTIONS) ×16 IMPLANT
KIT VASOVIEW HEMOPRO VH 3000 (KITS) ×4 IMPLANT
LEAD PACING MYOCARDI (MISCELLANEOUS) ×4 IMPLANT
MARKER GRAFT CORONARY BYPASS (MISCELLANEOUS) ×12 IMPLANT
NS IRRIG 1000ML POUR BTL (IV SOLUTION) ×20 IMPLANT
PACK OPEN HEART (CUSTOM PROCEDURE TRAY) ×4 IMPLANT
PAD ARMBOARD 7.5X6 YLW CONV (MISCELLANEOUS) ×8 IMPLANT
PAD ELECT DEFIB RADIOL ZOLL (MISCELLANEOUS) ×4 IMPLANT
PENCIL BUTTON HOLSTER BLD 10FT (ELECTRODE) ×4 IMPLANT
PUNCH AORTIC ROTATE 4.0MM (MISCELLANEOUS) ×4 IMPLANT
PUNCH AORTIC ROTATE 4.5MM 8IN (MISCELLANEOUS) IMPLANT
PUNCH AORTIC ROTATE 5MM 8IN (MISCELLANEOUS) IMPLANT
SET CARDIOPLEGIA MPS 5001102 (MISCELLANEOUS) ×4 IMPLANT
SOLUTION ANTI FOG 6CC (MISCELLANEOUS) IMPLANT
SPONGE GAUZE 4X4 12PLY STER LF (GAUZE/BANDAGES/DRESSINGS) ×8 IMPLANT
SPONGE LAP 18X18 X RAY DECT (DISPOSABLE) IMPLANT
SPONGE LAP 4X18 X RAY DECT (DISPOSABLE) IMPLANT
SUT BONE WAX W31G (SUTURE) ×4 IMPLANT
SUT ETHIBOND X763 2 0 SH 1 (SUTURE) ×8 IMPLANT
SUT MNCRL AB 3-0 PS2 18 (SUTURE) ×8 IMPLANT
SUT MNCRL AB 4-0 PS2 18 (SUTURE) ×4 IMPLANT
SUT PDS AB 1 CTX 36 (SUTURE) ×8 IMPLANT
SUT PROLENE 2 0 SH DA (SUTURE) IMPLANT
SUT PROLENE 3 0 SH DA (SUTURE) ×4 IMPLANT
SUT PROLENE 3 0 SH1 36 (SUTURE) IMPLANT
SUT PROLENE 4 0 RB 1 (SUTURE) ×8
SUT PROLENE 4 0 SH DA (SUTURE) IMPLANT
SUT PROLENE 4-0 RB1 .5 CRCL 36 (SUTURE) ×8 IMPLANT
SUT PROLENE 5 0 C 1 36 (SUTURE) IMPLANT
SUT PROLENE 6 0 C 1 30 (SUTURE) ×4 IMPLANT
SUT PROLENE 7.0 RB 3 (SUTURE) ×12 IMPLANT
SUT PROLENE 8 0 BV175 6 (SUTURE) ×4 IMPLANT
SUT PROLENE BLUE 7 0 (SUTURE) ×4 IMPLANT
SUT PROLENE POLY MONO (SUTURE) IMPLANT
SUT SILK  1 MH (SUTURE) ×2
SUT SILK 1 MH (SUTURE) ×2 IMPLANT
SUT STEEL 6MS V (SUTURE) IMPLANT
SUT STEEL STERNAL CCS#1 18IN (SUTURE) ×4 IMPLANT
SUT STEEL SZ 6 DBL 3X14 BALL (SUTURE) IMPLANT
SUT VIC AB 1 CTX 36 (SUTURE)
SUT VIC AB 1 CTX36XBRD ANBCTR (SUTURE) IMPLANT
SUT VIC AB 2-0 CT1 27 (SUTURE) ×2
SUT VIC AB 2-0 CT1 TAPERPNT 27 (SUTURE) ×2 IMPLANT
SUT VIC AB 2-0 CTX 27 (SUTURE) IMPLANT
SUT VIC AB 3-0 SH 27 (SUTURE)
SUT VIC AB 3-0 SH 27X BRD (SUTURE) IMPLANT
SUT VIC AB 3-0 X1 27 (SUTURE) IMPLANT
SUT VICRYL 4-0 PS2 18IN ABS (SUTURE) IMPLANT
SUTURE E-PAK OPEN HEART (SUTURE) ×4 IMPLANT
SYSTEM SAHARA CHEST DRAIN ATS (WOUND CARE) ×4 IMPLANT
TAPE CLOTH SURG 4X10 WHT LF (GAUZE/BANDAGES/DRESSINGS) ×4 IMPLANT
TAPE PAPER 3X10 WHT MICROPORE (GAUZE/BANDAGES/DRESSINGS) ×4 IMPLANT
TOWEL OR 17X24 6PK STRL BLUE (TOWEL DISPOSABLE) ×8 IMPLANT
TOWEL OR 17X26 10 PK STRL BLUE (TOWEL DISPOSABLE) ×8 IMPLANT
TRAY FOLEY IC TEMP SENS 16FR (CATHETERS) ×4 IMPLANT
TUBING INSUFFLATION (TUBING) ×4 IMPLANT
UNDERPAD 30X30 INCONTINENT (UNDERPADS AND DIAPERS) ×4 IMPLANT
WATER STERILE IRR 1000ML POUR (IV SOLUTION) ×8 IMPLANT

## 2015-11-04 NOTE — Progress Notes (Signed)
RT note-patient with low sp02, recruitment maneuver performed and post ABG, fio2 increased and rate increased to 16. Dr. Cornelius Moraswen aware.

## 2015-11-04 NOTE — Anesthesia Procedure Notes (Addendum)
Central Venous Catheter Insertion Performed by: anesthesiologist Patient location: Pre-op. Preanesthetic checklist: patient identified, IV checked, site marked, risks and benefits discussed, surgical consent, monitors and equipment checked, pre-op evaluation, timeout performed and anesthesia consent Position: Trendelenburg Lidocaine 1% used for infiltration Landmarks identified and Seldinger technique used PA cath was placed.Swan type and PA catheter depth:thermodilation and 48PA Cath depth:48 Procedure performed using ultrasound guided technique. Attempts: 1 Following insertion, line sutured and dressing applied. Post procedure assessment: blood return through all ports, free fluid flow and no air. Patient tolerated the procedure well with no immediate complications.   Central Venous Catheter Insertion Performed by: anesthesiologist Patient location: Pre-op. Preanesthetic checklist: patient identified, IV checked, site marked, risks and benefits discussed, surgical consent, monitors and equipment checked, pre-op evaluation, timeout performed and anesthesia consent Lidocaine 1% used for infiltration Landmarks identified Catheter size: 8.5 Fr Central line was placed.Sheath introducer Procedure performed using ultrasound guided technique. Attempts: 1 Following insertion, line sutured and dressing applied. Post procedure assessment: blood return through all ports, free fluid flow and no air. Patient tolerated the procedure well with no immediate complications.

## 2015-11-04 NOTE — Transfer of Care (Signed)
Immediate Anesthesia Transfer of Care Note  Patient: [name] [name]  Procedure(s) Performed: Procedure(s): CORONARY ARTERY BYPASS GRAFTING (CABG) times three using left internal mammary artery and right saphenous vein harvested with endoscope. (N/A) TRANSESOPHAGEAL ECHOCARDIOGRAM (TEE) (N/A)  Patient Location: SICU  Anesthesia Type:General  Level of Consciousness: sedated and Patient remains intubated per anesthesia plan  Airway & Oxygen Therapy: Patient remains intubated per anesthesia plan and Patient placed on Ventilator (see vital sign flow sheet for setting)  Post-op Assessment: Report given to RN and Post -op Vital signs reviewed and stable  Post vital signs: Reviewed and stable  Last Vitals:  Filed Vitals:   11/04/15 0554  BP: 131/72  Pulse: 72  Temp: 36.8 C  Resp: 18    Complications: No apparent anesthesia complications

## 2015-11-04 NOTE — Op Note (Signed)
CARDIOTHORACIC SURGERY OPERATIVE NOTE  Date of Procedure: 11/04/2015  Preoperative Diagnosis:   Severe Multi-vessel Coronary Artery Disease  Exertional Angina Pectoris  Postoperative Diagnosis: Same  Procedure:    Coronary Artery Bypass Grafting x 3   Left Internal Mammary Artery to Distal Left Anterior Descending Coronary Artery  Saphenous Vein Graft to First Obtuse Marginal Branch of Left Circumflex Coronary Artery  Sequential Saphenous Vein Graft to Second Obtuse Marginal Branch of Left Circumflex Coronary Artery  Endoscopic Vein Harvest from Right Thigh  Surgeon: Salvatore Decent. Cornelius Moras, MD  Assistant: Ardelle Balls, PA-C  Anesthesia: Cecile Hearing, MD  Operative Findings:  Normal LV systolic function  Mild mitral regurgitation  Small caliber but o/w good quality LIMA conduit for grafting  Good quality SVG conduit for grafting  Good quality target vessels for grafting     BRIEF CLINICAL NOTE AND INDICATIONS FOR SURGERY  Patient is a 70 year old male with history of coronary artery disease, hypertension, and hyperlipidemia referred for surgical consultation to discuss treatment options for management of severe two-vessel coronary artery disease. The patient states that he was first noted to have nonobstructive coronary artery disease in 1996. He underwent a stress test at that time followed by diagnostic cardiac catheterization. He was noted to have moderate nonobstructive coronary artery disease. He was treated medically. He has done very well from a cardiac standpoint until recently. He was seen in follow-up by his primary care physician approximately 2 months ago at which time he was complaining of body cramps that were felt to be related to statin therapy. His statin dose was adjusted and at the time the patient also mentioned having symptoms of exertional chest discomfort. He describes a pressure-like discomfort across the right side of the chest that  radiates to the substernal position. It typically is brought on by walking, working in the ER, or playing golf. Symptoms have been relatively mild but persistent for several months. He was referred for cardiology consultation and evaluated by Dr. Mayford Knife on 09/12/2015. A stress my view was performed and reportedly negative for ischemia, but the patient's symptoms persisted. Cardiac gated CT angiogram was notable for coronary calcium score of 1951 and severe greater than 70% stenosis of the proximal left anterior descending coronary artery. The patient subsequently was scheduled for elective diagnostic cardiac catheterization which was performed 10/28/2015 by Dr. Katrinka Blazing. The patient was found to have diffuse calcification throughout the coronary circulation with 50% ostial and 60% distal stenosis of the left main coronary artery, greater than 80% long segment calcified proximal stenosis of the left anterior descending coronary artery, heavily calcified eccentric 80-90% stenosis in the ostial left circumflex coronary artery, and normal left ventricular systolic function. The patient's coronary anatomy is felt to be relatively unfavorable for PCI and stenting. He has been referred for elective surgical consultation.  The patient has been seen in consultation and counseled at length regarding the indications, risks and potential benefits of surgery.  All questions have been answered, and the patient provides full informed consent for the operation as described.    DETAILS OF THE OPERATIVE PROCEDURE  Preparation:  The patient is brought to the operating room on the above mentioned date and central monitoring was established by the anesthesia team including placement of Swan-Ganz catheter and radial arterial line. The patient is placed in the supine position on the operating table.  Intravenous antibiotics are administered. General endotracheal anesthesia is induced uneventfully. A Foley catheter is  placed.  Baseline transesophageal echocardiogram was performed.  Findings were notable for normal LV systolic function.  There was mild central mitral regurgitation and trace aortic insufficiency.  The patient's chest, abdomen, both groins, and both lower extremities are prepared and draped in a sterile manner. A time out procedure is performed.   Surgical Approach and Conduit Harvest:  A median sternotomy incision was performed and the left internal mammary artery is dissected from the chest wall and prepared for bypass grafting. The left internal mammary artery is notably small in caliber but otherwise good quality conduit. Simultaneously, the greater saphenous vein is obtained from the patient's right thigh using endoscopic vein harvest technique. The saphenous vein is notably good quality conduit. After removal of the saphenous vein, the small surgical incisions in the lower extremity are closed with absorbable suture. Following systemic heparinization, the left internal mammary artery was transected distally noted to have adequate flow.   Extracorporeal Cardiopulmonary Bypass and Myocardial Protection:  The pericardium is opened. The ascending aorta is normal in appearance. The ascending aorta and the right atrium are cannulated for cardiopulmonary bypass.  Adequate heparinization is verified.   The entire pre-bypass portion of the operation was notable for stable hemodynamics.  Cardiopulmonary bypass was begun and the surface of the heart is inspected. Distal target vessels are selected for coronary artery bypass grafting. A cardioplegia cannula is placed in the ascending aorta.  A temperature probe was placed in the interventricular septum.  The patient is allowed to cool passively to [facility] systemic temperature.  The aortic cross clamp is applied and cold blood cardioplegia is delivered initially in an antegrade fashion through the aortic root.   Iced saline slush is applied for topical  hypothermia.  The initial cardioplegic arrest is rapid with early diastolic arrest.  Repeat doses of cardioplegia are administered intermittently throughout the entire cross clamp portion of the operation through the aortic root and through subsequently placed vein grafts in order to maintain completely flat electrocardiogram and septal myocardial temperature below 15C.  Myocardial protection was felt to be excellent.  Coronary Artery Bypass Grafting:   The first obtuse marginal branch of the left circumflex coronary artery was grafted using a reversed saphenous vein graft in an side-to-side fashion.  At the site of distal anastomosis the target vessel was good quality and measured approximately 2.2 mm in diameter.  The second obtuse marginal branch of the left circumflex coronary artery was grafted in and end-to-side fashion using a sequential saphenous vein graft off of the distal segment of the vein graft placed to the first obtuse marginal branch coronary artery.  At the site of distal anastomosis the target vessel was good quality and measured approximately 1.5 mm in diameter.  The distal left anterior coronary artery was grafted with the left internal mammary artery in an end-to-side fashion.  At the site of distal anastomosis the target vessel was good quality and measured approximately 2.0 mm in diameter.  All proximal vein graft anastomoses were placed directly to the ascending aorta prior to removal of the aortic cross clamp.  The septal myocardial temperature rose rapidly after reperfusion of the left internal mammary artery graft.  The aortic cross clamp was removed after a total cross clamp time of 53 minutes.    All proximal and distal coronary anastomoses were inspected for hemostasis and appropriate graft orientation.    During retraction of the heart to expose the lateral wall the distal anastomosis of the left anterior descending coronary artery began to bleed due to traction injury.  Subsequently the cross clamp was replaced and another dose of cold blood cardioplegia was administered through the aortic root.  The distal anastomosis of the left anterior descending coronary artery was taken down and resewn.  The septal myocardial temperature rose rapidly after reperfusion of the left internal mammary artery graft.  The cross clamp was removed after an additional time of 18 minutes such that the grand total cross clamp time was 71 minutes for the procedure.   Procedure Completion:  Epicardial pacing wires are fixed to the right ventricular outflow tract and to the right atrial appendage. The patient is rewarmed to 37C temperature. The patient is weaned and disconnected from cardiopulmonary bypass.  The patient's rhythm at separation from bypass was AV paced.  The patient was weaned from cardiopulmonary bypass without any inotropic support. Total cardiopulmonary bypass time for the operation was 99 minutes.  Followup transesophageal echocardiogram performed after separation from bypass revealed no changes from the preoperative exam.  The aortic and venous cannula were removed uneventfully. Protamine was administered to reverse the anticoagulation. The mediastinum and pleural space were inspected for hemostasis and irrigated with saline solution. The mediastinum and the left pleural space were drained using 3 chest tubes placed through separate stab incisions inferiorly.  The soft tissues anterior to the aorta were reapproximated loosely. The sternum is closed with double strength sternal wire. The soft tissues anterior to the sternum were closed in multiple layers and the skin is closed with a running subcuticular skin closure.  The post-bypass portion of the operation was notable for stable rhythm and hemodynamics.  No blood products were administered during the operation.   Disposition:  The patient tolerated the procedure well and is transported to the surgical intensive care  in stable condition. There are no intraoperative complications. All sponge instrument and needle counts are verified correct at completion of the operation.    Salvatore Decent. Cornelius Moras MD 11/04/2015 12:47 PM

## 2015-11-04 NOTE — H&P (View-Only) (Signed)
     [address]       [place], [address]             [phone]     CARDIOTHORACIC SURGERY CONSULTATION REPORT  Referring Provider is Smith, Henry W, III, MD Primary Cardiologist is Turner, Traci R, MD PCP is GATES,ROBERT NEVILL, MD  Chief Complaint  Patient presents with  . Coronary Artery Disease    eval for CABG...CATH 10/28/15, CT CARD. MORH. 10/18/15, STRESS TEST 09/23/15    HPI:  Patient is a 70-year-old male with history of coronary artery disease, hypertension, and hyperlipidemia referred for surgical consultation to discuss treatment options for management of severe two-vessel coronary artery disease. The patient states that he was first noted to have nonobstructive coronary artery disease in 1996. He underwent a stress test at that time followed by diagnostic cardiac catheterization. He was noted to have moderate nonobstructive coronary artery disease. He was treated medically. He has done very well from a cardiac standpoint until recently.  He was seen in follow-up by his primary care physician approximately 2 months ago at which time he was complaining of body cramps that were felt to be related to statin therapy. His statin dose was adjusted and at the time the patient also mentioned having symptoms of exertional chest discomfort. He describes a pressure-like discomfort across the right side of the chest that radiates to the substernal position. It typically is brought on by walking, working in the ER, or playing golf. Symptoms have been relatively mild but persistent for several months. He was referred for cardiology consultation and evaluated by Dr. Turner on 09/12/2015.  A stress my view was performed and reportedly negative for ischemia, but the patient's symptoms persisted. Cardiac gated CT angiogram was notable for coronary calcium score of 1951 and severe greater than 70% stenosis of the proximal left anterior descending coronary artery. The patient  subsequently was scheduled for elective diagnostic cardiac catheterization which was performed 10/28/2015 by Dr. Smith.  The patient was found to have diffuse calcification throughout the coronary circulation with 50% ostial and 60% distal stenosis of the left main coronary artery, greater than 80% long segment calcified proximal stenosis of the left anterior descending coronary artery, heavily calcified eccentric 80-90% stenosis in the ostial left circumflex coronary artery, and normal left ventricular systolic function. The patient's coronary anatomy is felt to be relatively unfavorable for PCI and stenting. He has been referred for elective surgical consultation.  The patient is married and lives locally in [place] with his wife. He has been retired since 2005, having previously worked for the city of [place]. He remains physically active and functionally independent. He enjoys playing golf. He reports no specific physical limitations. Several months ago the patient began to experience substernal chest discomfort described as a pressure-like pain across the right side of his chest that radiates to the middle. Symptoms are typically brought on with physical exertion. On one occasion he recently took sublingual nitroglycerin and the symptoms resolved promptly. Since starting beta blocker therapy if the frequency of symptoms has decreased. The patient denies any episodes of prolonged substernal chest discomfort. He has not had nocturnal chest pain. He denies any associated shortness of breath either with activity or rest.  He denies any history of dizzy spells, palpitations, or syncope.  Past Medical History  Diagnosis Date  . History of kidney stones   . Hypertension     takes Norvasc and Micardis daily  . Hyperlipidemia       takes Crestor daily  . Pneumonia     history of double 30+yrs ago  . History of seasonal allergies     takes Loratadine daily  . Joint pain   . Chronic back pain      herniated disc  . GERD (gastroesophageal reflux disease)     takes Omeprazole daily  . Diverticulosis   . Nocturia   . Muscle cramps     takes KDUr and Mag Ox as a trial to see if it will help  . History of gout     takes Allopurinol daily  . Chronic kidney disease     Kidney stone  . CAD (coronary artery disease), native coronary artery 09/12/2015    Past Surgical History  Procedure Laterality Date  . Cystoscopy  1993  . Foot surgery  25yrs ago    right-removed bone spur   . Colonoscopy    . Esophagogastroduodenoscopy      with diliation  . Lithotripsy      x 2  . Vasectomy  30yrs ago  . Lumbar laminectomy  03-07-2012    with fusion  . Cystoscopy with ureteroscopy  07/28/2012    Procedure: CYSTOSCOPY WITH URETEROSCOPY;  Surgeon: Les Borden, MD;  Location: [facility];  Service: Urology;  Laterality: Left;     . Holmium laser application  07/28/2012    Procedure: HOLMIUM LASER APPLICATION;  Surgeon: Les Borden, MD;  Location: [facility];  Service: Urology;  Laterality: Left;  left lithotripsy  . Cardiac catheterization N/A 10/28/2015    Procedure: Left Heart Cath and Coronary Angiography;  Surgeon: Henry W Smith, MD;  Location: [facility];  Service: Cardiovascular;  Laterality: N/A;    Family History  Problem Relation Age of Onset  . Dementia Mother   . Prostate cancer Father     Social History   Social History  . Marital Status: Married    Spouse Name: N/A  . Number of Children: N/A  . Years of Education: N/A   Occupational History  . Not on file.   Social History Main Topics  . Smoking status: Never Smoker   . Smokeless tobacco: Never Used  . Alcohol Use: No  . Drug Use: No  . Sexual Activity: Yes   Other Topics Concern  . Not on file   Social History Narrative    Current Outpatient Prescriptions  Medication Sig Dispense Refill  . allopurinol (ZYLOPRIM) 100 MG tablet Take 100 mg by mouth every morning.     . amLODipine (NORVASC) 5 MG tablet Take 5 mg  by mouth every morning.     . aspirin EC 81 MG tablet Take 81 mg by mouth daily.    . cholecalciferol (VITAMIN D) 1000 UNITS tablet Take 1,000 Units by mouth daily.    . Coenzyme Q10 (COQ10) 100 MG CAPS Take 2 capsules by mouth daily.     . ezetimibe (ZETIA) 10 MG tablet Take 1 tablet (10 mg total) by mouth daily. 30 tablet 11  . fexofenadine (ALLEGRA) 180 MG tablet Take 180 mg by mouth daily as needed. (allergies)    . loratadine (CLARITIN) 10 MG tablet Take 10 mg by mouth daily.    . metoprolol tartrate (LOPRESSOR) 25 MG tablet Take 1 tablet (25 mg total) by mouth 2 (two) times daily. 180 tablet 3  . Multiple Vitamin (MULTIVITAMIN WITH MINERALS) TABS Take 1 tablet by mouth daily.    . nitroGLYCERIN (NITROSTAT) 0.4 MG SL tablet Place 0.4 mg under the   tongue every 5 (five) minutes x 3 doses as needed. (chest pain)  0  . Omega-3 Fatty Acids (FISH OIL) 1200 MG CAPS Take 1 capsule by mouth daily.    . pantoprazole (PROTONIX) 40 MG tablet Take 1 tablet (40 mg total) by mouth daily. 30 tablet 11  . pravastatin (PRAVACHOL) 20 MG tablet Take 1 tablet (20 mg total) by mouth every evening. 30 tablet 11  . Tamsulosin HCl (FLOMAX) 0.4 MG CAPS Take 0.4 mg by mouth daily after supper.    . telmisartan (MICARDIS) 80 MG tablet Take 80 mg by mouth every morning.     No current facility-administered medications for this visit.    Allergies  Allergen Reactions  . Atorvastatin Other (See Comments)    Reaction: dizziness/ dyspepsia  . Hctz [Hydrochlorothiazide] Other (See Comments)    Reaction: arthralgias/ skin burning  . Rosuvastatin Other (See Comments)    Reaction: muscle cramping      Review of Systems:   General:  normal appetite, decreased energy, no weight gain, no weight loss, no fever  Cardiac:  + chest pain with exertion, + occasional brief episodes chest pain at rest, no SOB with exertion, no resting SOB, no PND, no orthopnea, no palpitations, no arrhythmia, no atrial fibrillation, no LE  edema, no dizzy spells, no syncope  Respiratory:  no shortness of breath, no home oxygen, no productive cough, no dry cough, no bronchitis, no wheezing, no hemoptysis, no asthma, no pain with inspiration or cough, no sleep apnea, no CPAP at night  GI:   + mild difficulty swallowing, + reflux, + frequent heartburn, no hiatal hernia, no abdominal pain, no constipation, no diarrhea, no hematochezia, no hematemesis, no melena  GU:   no dysuria,  no frequency, no urinary tract infection, no hematuria, no enlarged prostate, + kidney stones, no kidney disease  Vascular:  no pain suggestive of claudication, no pain in feet, no leg cramps, no varicose veins, no DVT, no non-healing foot ulcer  Neuro:   no stroke, no TIA's, no seizures, no headaches, no temporary blindness one eye,  no slurred speech, no peripheral neuropathy, no chronic pain, no instability of gait, no memory/cognitive dysfunction  Musculoskeletal: + arthritis in lower back, no joint swelling, no myalgias, no difficulty walking, no mobility   Skin:   no rash, no itching, no skin infections, no pressure sores or ulcerations  Psych:   no anxiety, no depression, no nervousness, no unusual recent stress  Eyes:   no blurry vision, no floaters, no recent vision changes, does not wears glasses or contacts  ENT:   no hearing loss, no loose or painful teeth, no dentures, last saw dentist Dec 2016  Hematologic:  no easy bruising, no abnormal bleeding, no clotting disorder, no frequent epistaxis  Endocrine:  no diabetes, does not check CBG's at home     Physical Exam:   BP 128/79 mmHg  Pulse 71  Resp 16  Ht 5' 6" (1.676 m)  Wt 215 lb (97.523 kg)  BMI 34.72 kg/m2  SpO2 96%  General:  Mildly obese,  well-appearing  HEENT:  Unremarkable   Neck:   no JVD, no bruits, no adenopathy   Chest:   clear to auscultation, symmetrical breath sounds, no wheezes, no rhonchi   CV:   RRR, no  murmur   Abdomen:  soft, non-tender, no masses    Extremities:  warm, well-perfused, pulses palpable, no LE edema  Rectal/GU  Deferred  Neuro:   Grossly non-focal and symmetrical   throughout  Skin:   Clean and dry, no rashes, no breakdown   Diagnostic Tests:  CARDIAC CATHETERIZATION Procedures    Left Heart Cath and Coronary Angiography    Conclusion    1. Mid Cx to Dist Cx lesion, 80% stenosed. 2. Ost LAD to Prox LAD lesion, 80% stenosed. 3. Ost LM to LM lesion, 60% stenosed. 4. Ost LM lesion, 60% stenosed. 5. Ost Cx lesion, 75% stenosed. 6. Ost Ramus lesion, 75% stenosed. 7. Ost RCA lesion, 40% stenosed.   Severe diffuse left coronary calcification.  50% eccentric ostial left main and 60% distal left main.  Diffuse greater than 80% proximal to mid LAD.  Heavily calcified eccentric 80-90% stenosis in the ostial to proximal circumflex.  40% proximal RCA  Low normal LV systolic function with normal LVEDP  RECOMMENDATIONS:   TCTS consultation to consider coronary bypass grafting.     Indications    Coronary artery disease involving native coronary artery of native heart with angina pectoris (HCC) [I25.119 (ICD-10-CM)]   Ischemic chest pain (HCC) [I20.9 (ICD-10-CM)]    Technique and Indications    The right radial area was sterilely prepped and draped. Intravenous sedation with Versed and fentanyl was administered. 1% Xylocaine was infiltrated to achieve local analgesia. A double wall stick with an angiocath was utilized to obtain intra-arterial access. The modified Seldinger technique was used to place a 5F " Slender" sheath in the right radial artery. Weight based heparin was administered. Coronary angiography was done using 5 F catheters. Right coronary angiography was performed with a JR4. Left ventricular hemodymic recordings and angiography was done using the JR 4 catheter and hand injection. Left coronary angiography was performed with a JL 3.5 cm.  Hemostasis was achieved using a pneumatic  band.  During this procedure the patient is administered a total of Versed 1 mg and Fentanyl 50 mcg to achieve and maintain moderate conscious sedation. The patient's heart rate, blood pressure, and oxygen saturation are monitored continuously during the procedure. The period of conscious sedation is 21 minutes, of which I was present face-to-face 100% of this time.Estimated blood loss <50 mL. There were no immediate complications during the procedure.    Coronary Findings    Dominance: Right   Left Main   . Ost LM lesion, 60% stenosed. Eccentric.   . Ost LM to LM lesion, 60% stenosed. The lesion is type C Calcified.     Left Anterior Descending   . Ost LAD to Prox LAD lesion, 80% stenosed. The lesion is type C Calcified eccentric.     Ramus Intermedius   . Ost Ramus lesion, 75% stenosed. Eccentric.     Left Circumflex   . Ost Cx lesion, 75% stenosed. Calcified eccentric.   . Mid Cx to Dist Cx lesion, 80% stenosed. Calcified tubular eccentric.   . First Obtuse Marginal Branch   The vessel is small in size.   . Second Obtuse Marginal Branch   The vessel is small in size.     Right Coronary Artery   . Ost RCA lesion, 40% stenosed.      Wall Motion                 Coronary Diagrams    Diagnostic Diagram            Implants     No implant documentation for this case.    PACS Images    Show images for Cardiac catheterization     Link to Procedure Log      Procedure Log      Hemo Data       Most Recent Value   AO Systolic Pressure  113 mmHg   AO Diastolic Pressure  69 mmHg   AO Mean  89 mmHg   LV Systolic Pressure  108 mmHg   LV Diastolic Pressure  5 mmHg   LV EDP  16 mmHg   Arterial Occlusion Pressure Extended Systolic Pressure  108 mmHg   Arterial Occlusion Pressure Extended Diastolic Pressure  67 mmHg   Arterial Occlusion Pressure Extended Mean Pressure  86 mmHg   Left Ventricular Apex Extended Systolic Pressure  109 mmHg    Left Ventricular Apex Extended Diastolic Pressure  9 mmHg   Left Ventricular Apex Extended EDP Pressure  16 mmHg       Impression:  Patient has severe multivessel coronary artery disease with preserved left ventricular systolic function and presents with symptoms of classical exertional angina. I have personally reviewed the patient's recent diagnostic cardiac catheterization.  The patient has 50% ostial and 60% distal stenosis of the left main coronary artery with diffuse heavy calcification throughout the proximal left anterior descending coronary artery and the left circumflex coronary artery. There is severe 80% proximal stenosis of the left anterior descending coronary artery and high-grade ostial stenosis of left circumflex coronary artery. Coronary anatomy appears very unfavorable for percutaneous coronary intervention. I agree the patient would best be treated with surgical revascularization.  Risks associated with surgery should be relatively low.   Plan:  I have reviewed the indications, risks, and potential benefits of coronary artery bypass grafting with the patient and his wife.  Alternative treatment strategies have been discussed, including the relative risks, benefits and long term prognosis associated with medical therapy, percutaneous coronary intervention, and surgical revascularization.  The patient understands and accepts all potential associated risks of surgery including but not limited to risk of death, stroke or other neurologic complication, myocardial infarction, congestive heart failure, respiratory failure, renal failure, bleeding requiring blood transfusion and/or reexploration, aortic dissection or other major vascular complication, arrhythmia, heart block or bradycardia requiring permanent pacemaker, pneumonia, pleural effusion, wound infection, pulmonary embolus or other thromboembolic complication, chronic pain or other delayed complications related to median  sternotomy, or the late recurrence of symptomatic ischemic heart disease and/or congestive heart failure.  The importance of long term risk modification have been emphasized.  All questions answered.  We tentatively plan to proceed with surgery on Friday, 11/04/2015.    I spent in excess of 90 minutes during the conduct of this office consultation and >50% of this time involved direct face-to-face encounter with the patient for counseling and/or coordination of their care.   Henry Demeritt H. Babacar Haycraft, MD 11/01/2015 3:12 PM    

## 2015-11-04 NOTE — OR Nursing (Signed)
2nd call SICU 1238

## 2015-11-04 NOTE — Progress Notes (Signed)
RT note-patient close to weaning, starting back with original ventilator parameters.

## 2015-11-04 NOTE — Progress Notes (Signed)
  Echocardiogram Echocardiogram Transesophageal has been performed.  [name] [name] 11/04/2015, 8:20 AM 

## 2015-11-04 NOTE — OR Nursing (Signed)
1st call to SICU 1152 

## 2015-11-04 NOTE — Interval H&P Note (Signed)
History and Physical Interval Note:  11/04/2015 7:59 AM  [name] [name]  has presented today for surgery, with the diagnosis of cad  The various methods of treatment have been discussed with the patient and family. After consideration of risks, benefits and other options for treatment, the patient has consented to  Procedure(s): CORONARY ARTERY BYPASS GRAFTING (CABG) (N/A) TRANSESOPHAGEAL ECHOCARDIOGRAM (TEE) (N/A) as a surgical intervention .  The patient's history has been reviewed, patient examined, no change in status, stable for surgery.  I have reviewed the patient's chart and labs.  Questions were answered to the patient's satisfaction.     Purcell Nailslarence H Owen

## 2015-11-04 NOTE — Progress Notes (Signed)
TCTS BRIEF SICU PROGRESS NOTE  Day of Surgery  S/P Procedure(s) (LRB): CORONARY ARTERY BYPASS GRAFTING (CABG) times three using left internal mammary artery and right saphenous vein harvested with endoscope. (N/A) TRANSESOPHAGEAL ECHOCARDIOGRAM (TEE) (N/A)   Waking up on vent NSR w/ stable hemodynamics off all drips Tolerating vent wean, looks ready for extubation Chest tube output low UOP adequate Labs okay w/ mild metabolic acidosis  Plan: Continue routine early postop  Purcell Nailslarence H Owen, MD 11/04/2015 6:27 PM

## 2015-11-04 NOTE — Procedures (Signed)
Extubation Procedure Note  Patient Details:   Name: [name] [name] DOB: Jan 06, 1946 MRN: [id number]   Airway Documentation:     Evaluation  O2 sats: stable throughout Complications: No apparent complications Patient did tolerate procedure well. Bilateral Breath Sounds: Clear   Yes  Placed on 4l/min [place], incentive attempted, but having a lot pain, NIF-40/FVC 600ml Dr. Cornelius Moraswen at bedside  [name] [name], [name] [name] 11/04/2015, 6:37 PM

## 2015-11-04 NOTE — Brief Op Note (Addendum)
11/04/2015  11:05 AM  PATIENT:  [name] [name]  70 y.o. male  PRE-OPERATIVE DIAGNOSIS:  CAD  POST-OPERATIVE DIAGNOSIS:  CAD  PROCEDURE:  TRANSESOPHAGEAL ECHOCARDIOGRAM (TEE),MEDIAN STERNOTOMY for CORONARY ARTERY BYPASS GRAFTING (CABG) x 3 (LIMA to LAD, SVG SEQUENTIALLY to OM1 and OM2) using left internal mammary artery and right greater saphenous vein harvested with endoscope.    SURGEON:    Purcell Nailslarence H Vincentina Sollers, MD  ASSISTANTS:  Ardelle Ballsonielle M Zimmerman, PA-C  ANESTHESIA:   Cecile HearingStephen Edward Turk, MD  CROSSCLAMP TIME:   7071'  CARDIOPULMONARY BYPASS TIME: 1599'  FINDINGS:  Normal LV systolic function  Mild mitral regurgitation  Small caliber but o/w good quality LIMA conduit for grafting  Good quality SVG conduit for grafting  Good quality target vessels for grafting  COMPLICATIONS: None  BASELINE WEIGHT: 98 kg  PATIENT DISPOSITION:   TO SICU IN STABLE CONDITION  Purcell Nailslarence H Louisiana Searles, MD 11/04/2015 12:43 PM

## 2015-11-04 NOTE — Care Management Note (Signed)
Case Management Note  Patient Details  Name: [name] [name] MRN: [id number] Date of Birth: 08/07/1945  Subjective/Objective:            Pt s/p CABG         Action/Plan:  PTA - independent from home with wife.  Wife states she will provide 24 hour supervision at discharge   Expected Discharge Date:                  Expected Discharge Plan:  Home/Self Care  In-House Referral:     Discharge planning Services  CM Consult  Post Acute Care Choice:    Choice offered to:     DME Arranged:    DME Agency:     HH Arranged:    HH Agency:     Status of Service:  In process, will continue to follow  Medicare Important Message Given:    Date Medicare IM Given:    Medicare IM give by:    Date Additional Medicare IM Given:    Additional Medicare Important Message give by:     If discussed at Long Length of Stay Meetings, dates discussed:    Additional Comments:  Cherylann ParrClaxton, Zaliah Wissner S, RN 11/04/2015, 3:05 PM

## 2015-11-05 ENCOUNTER — Encounter (HOSPITAL_COMMUNITY): Payer: Self-pay | Admitting: Thoracic Surgery (Cardiothoracic Vascular Surgery)

## 2015-11-05 ENCOUNTER — Inpatient Hospital Stay (HOSPITAL_COMMUNITY): Payer: Medicare Other

## 2015-11-05 DIAGNOSIS — N183 Chronic kidney disease, stage 3 unspecified: Secondary | ICD-10-CM | POA: Diagnosis present

## 2015-11-05 LAB — CBC
HCT: 28.8 % — ABNORMAL LOW (ref 39.0–52.0)
HCT: 26.8 % — ABNORMAL LOW (ref 39.0–52.0)
HEMOGLOBIN: 8.5 g/dL — AB (ref 13.0–17.0)
Hemoglobin: 8.8 g/dL — ABNORMAL LOW (ref 13.0–17.0)
MCH: 26.7 pg (ref 26.0–34.0)
MCH: 27.5 pg (ref 26.0–34.0)
MCHC: 30.6 g/dL (ref 30.0–36.0)
MCHC: 31.7 g/dL (ref 30.0–36.0)
MCV: 87.3 fL (ref 78.0–100.0)
MCV: 86.7 fL (ref 78.0–100.0)
PLATELETS: 118 10*3/uL — AB (ref 150–400)
Platelets: 89 10*3/uL — ABNORMAL LOW (ref 150–400)
RBC: 3.3 MIL/uL — AB (ref 4.22–5.81)
RBC: 3.09 MIL/uL — AB (ref 4.22–5.81)
RDW: 16.2 % — ABNORMAL HIGH (ref 11.5–15.5)
RDW: 16.7 % — ABNORMAL HIGH (ref 11.5–15.5)
WBC: 13.8 10*3/uL — ABNORMAL HIGH (ref 4.0–10.5)
WBC: 12.4 10*3/uL — ABNORMAL HIGH (ref 4.0–10.5)

## 2015-11-05 LAB — GLUCOSE, CAPILLARY
GLUCOSE-CAPILLARY: 128 mg/dL — AB (ref 65–99)
GLUCOSE-CAPILLARY: 124 mg/dL — AB (ref 65–99)
GLUCOSE-CAPILLARY: 126 mg/dL — AB (ref 65–99)
GLUCOSE-CAPILLARY: 116 mg/dL — AB (ref 65–99)
GLUCOSE-CAPILLARY: 112 mg/dL — AB (ref 65–99)
GLUCOSE-CAPILLARY: 113 mg/dL — AB (ref 65–99)
Glucose-Capillary: 101 mg/dL — ABNORMAL HIGH (ref 65–99)
Glucose-Capillary: 104 mg/dL — ABNORMAL HIGH (ref 65–99)
Glucose-Capillary: 104 mg/dL — ABNORMAL HIGH (ref 65–99)
Glucose-Capillary: 105 mg/dL — ABNORMAL HIGH (ref 65–99)
Glucose-Capillary: 112 mg/dL — ABNORMAL HIGH (ref 65–99)
Glucose-Capillary: 115 mg/dL — ABNORMAL HIGH (ref 65–99)
Glucose-Capillary: 116 mg/dL — ABNORMAL HIGH (ref 65–99)
Glucose-Capillary: 116 mg/dL — ABNORMAL HIGH (ref 65–99)
Glucose-Capillary: 121 mg/dL — ABNORMAL HIGH (ref 65–99)
Glucose-Capillary: 122 mg/dL — ABNORMAL HIGH (ref 65–99)
Glucose-Capillary: 124 mg/dL — ABNORMAL HIGH (ref 65–99)
Glucose-Capillary: 132 mg/dL — ABNORMAL HIGH (ref 65–99)
Glucose-Capillary: 136 mg/dL — ABNORMAL HIGH (ref 65–99)
Glucose-Capillary: 140 mg/dL — ABNORMAL HIGH (ref 65–99)
Glucose-Capillary: 140 mg/dL — ABNORMAL HIGH (ref 65–99)
Glucose-Capillary: 148 mg/dL — ABNORMAL HIGH (ref 65–99)
Glucose-Capillary: 91 mg/dL (ref 65–99)
Glucose-Capillary: 98 mg/dL (ref 65–99)

## 2015-11-05 LAB — MAGNESIUM
Magnesium: 2.6 mg/dL — ABNORMAL HIGH (ref 1.7–2.4)
Magnesium: 2.5 mg/dL — ABNORMAL HIGH (ref 1.7–2.4)

## 2015-11-05 LAB — POCT I-STAT, CHEM 8
BUN: 20 mg/dL (ref 6–20)
CHLORIDE: 102 mmol/L (ref 101–111)
Calcium, Ion: 1.2 mmol/L (ref 1.13–1.30)
Creatinine, Ser: 1.7 mg/dL — ABNORMAL HIGH (ref 0.61–1.24)
Glucose, Bld: 118 mg/dL — ABNORMAL HIGH (ref 65–99)
HEMATOCRIT: 29 % — AB (ref 39.0–52.0)
Hemoglobin: 9.9 g/dL — ABNORMAL LOW (ref 13.0–17.0)
Potassium: 4.3 mmol/L (ref 3.5–5.1)
SODIUM: 137 mmol/L (ref 135–145)
TCO2: 24 mmol/L (ref 0–100)

## 2015-11-05 LAB — BASIC METABOLIC PANEL
Anion gap: 9 (ref 5–15)
BUN: 17 mg/dL (ref 6–20)
CO2: 22 mmol/L (ref 22–32)
Calcium: 8 mg/dL — ABNORMAL LOW (ref 8.9–10.3)
Chloride: 107 mmol/L (ref 101–111)
Creatinine, Ser: 1.76 mg/dL — ABNORMAL HIGH (ref 0.61–1.24)
GFR calc Af Amer: 44 mL/min — ABNORMAL LOW (ref >60)
GFR, EST NON AFRICAN AMERICAN: 38 mL/min — AB (ref >60)
GLUCOSE: 155 mg/dL — AB (ref 65–99)
POTASSIUM: 4.7 mmol/L (ref 3.5–5.1)
Sodium: 138 mmol/L (ref 135–145)

## 2015-11-05 LAB — CREATININE, SERUM
Creatinine, Ser: 1.7 mg/dL — ABNORMAL HIGH (ref 0.61–1.24)
GFR calc Af Amer: 46 mL/min — ABNORMAL LOW (ref >60)
GFR, EST NON AFRICAN AMERICAN: 39 mL/min — AB (ref >60)

## 2015-11-05 MED ORDER — INSULIN ASPART 100 UNIT/ML ~~LOC~~ SOLN
0.0000 [IU] | SUBCUTANEOUS | Status: DC
  Administered 2015-11-05: 2 [IU] via SUBCUTANEOUS

## 2015-11-05 MED ORDER — MORPHINE SULFATE (PF) 2 MG/ML IV SOLN
1.0000 mg | INTRAVENOUS | Status: DC | PRN
  Administered 2015-11-05 – 2015-11-06 (×2): 2 mg via INTRAVENOUS
  Filled 2015-11-05 (×2): qty 1

## 2015-11-05 MED ORDER — FUROSEMIDE 10 MG/ML IJ SOLN
20.0000 mg | Freq: Once | INTRAMUSCULAR | Status: AC
  Administered 2015-11-05: 20 mg via INTRAVENOUS
  Filled 2015-11-05: qty 2

## 2015-11-05 MED ORDER — INSULIN DETEMIR 100 UNIT/ML ~~LOC~~ SOLN
28.0000 [IU] | Freq: Once | SUBCUTANEOUS | Status: AC
  Administered 2015-11-05: 28 [IU] via SUBCUTANEOUS
  Filled 2015-11-05: qty 0.28

## 2015-11-05 MED ORDER — CETYLPYRIDINIUM CHLORIDE 0.05 % MT LIQD
7.0000 mL | Freq: Two times a day (BID) | OROMUCOSAL | Status: DC
  Administered 2015-11-05 – 2015-11-08 (×4): 7 mL via OROMUCOSAL

## 2015-11-05 NOTE — Progress Notes (Signed)
[address]       [place] [address]             [phone]        CARDIOTHORACIC SURGERY PROGRESS NOTE   R1 Day Post-Op Procedure(s) (LRB): CORONARY ARTERY BYPASS GRAFTING (CABG) times three using left internal mammary artery and right saphenous vein harvested with endoscope. (N/A) TRANSESOPHAGEAL ECHOCARDIOGRAM (TEE) (N/A)  Subjective: Looks good.  Mild soreness in chest.  No complaints  Objective: Vital signs: BP Readings from Last 1 Encounters:  11/05/15 98/66   Pulse Readings from Last 1 Encounters:  11/05/15 93   Resp Readings from Last 1 Encounters:  11/05/15 10   Temp Readings from Last 1 Encounters:  11/05/15 99.9 F (37.7 C)     Hemodynamics: PAP: (17-57)/(9-42) 34/21 mmHg CO:  [3.3 L/min-6.1 L/min] 6.1 L/min CI:  [1.6 L/min/m2-3 L/min/m2] 3 L/min/m2  Physical Exam:  Rhythm:   sinus  Breath sounds: clear  Heart sounds:  RRR  Incisions:  Dressings dry, intact  Abdomen:  Soft, non-distended, non-tender  Extremities:  Warm, well-perfused  Chest tubes:  Low volume thin serosanguinous output, no air leak   Intake/Output from previous day: 04/21 0701 - 04/22 0700 In: 7447.1 [I.V.:4937.1; Blood:470; IV RWPTYYPEJ:6116] Out: 2245 [IHDTP:1225; Chest Tube:480] Intake/Output this shift:    Lab Results:  CBC: Recent Labs  11/04/15 1900 11/05/15 0419  WBC 11.3* 13.8*  HGB 9.5* 8.8*  HCT 30.4* 28.8*  PLT 92* 118*    BMET:  Recent Labs  11/02/15 1450  11/04/15 1855 11/04/15 1900 11/05/15 0419  NA 141  < > 141  --  138  K 4.0  < > 4.7  --  4.7  CL 109  < > 105  --  107  CO2 21*  --   --   --  22  GLUCOSE 92  < > 153*  --  155*  BUN 20  < > 17  --  17  CREATININE 1.80*  < > 1.40* 1.65* 1.76*  CALCIUM 9.6  --   --   --  8.0*  < > = values in this interval not displayed.   PT/INR:   Recent Labs  11/04/15 1315  LABPROT 17.4*  INR 1.42    CBG (last 3)   Recent Labs  11/04/15 2201 11/04/15 2302 11/05/15 0008   GLUCAP 116* 140* 121*    ABG    Component Value Date/Time   PHART 7.372 11/04/2015 1945   PCO2ART 44.1 11/04/2015 1945   PO2ART 72.0* 11/04/2015 1945   HCO3 25.8* 11/04/2015 1945   TCO2 27 11/04/2015 1945   ACIDBASEDEF 6.0* 11/04/2015 1818   O2SAT 94.0 11/04/2015 1945    CXR: PORTABLE CHEST 1 VIEW  COMPARISON: 11/04/2015  FINDINGS: The endotracheal tube and oral/ nasogastric tube have been removed. The right internal jugular Swan-Ganz catheter, mediastinal tube and left chest tube are stable and well positioned.  There is no mediastinal widening.  Lung volumes are low. There is lung base atelectasis similar to the previous day's exam. No pulmonary edema.  No pneumothorax.  IMPRESSION: 1. No evidence of an operative complication. 2. Low lung volumes with lung base atelectasis similar to the previous day's study. No evidence of pulmonary edema. 3. No pneumothorax or mediastinal widening. 4. Remaining support apparatus is stable and well positioned.   Electronically Signed  By: Lajean Manes M.D.  On: 11/05/2015 08:27  EKG: NSR w/out acute ischemic changes   Assessment/Plan: S/P  Procedure(s) (LRB): CORONARY ARTERY BYPASS GRAFTING (CABG) times three using left internal mammary artery and right saphenous vein harvested with endoscope. (N/A) TRANSESOPHAGEAL ECHOCARDIOGRAM (TEE) (N/A)  Doing well POD1 Maintaining NSR w/ stable hemodynamics on low dose Neo drip Breathing comfortably w/ O2 sats 97% on O2 4 L/min via [place] Expected post op acute blood loss anemia, Hgb 8.8 this morning Expected post op atelectasis, mild Expected post op volume excess, mild Type II diabetes mellitus, excellent glycemic control on insulin drip Chronic kidney disease, stage IIIA - creatinine stable 1.76, eGFR 38   Mobilize  D/C lines  D/C tubes later today or tomorrow  Hold diuretics for now  Add levemir insulin and wean drip  Rexene Alberts, MD 11/05/2015 8:12  AM

## 2015-11-05 NOTE — Progress Notes (Signed)
TCTS BRIEF SICU PROGRESS NOTE  1 Day Post-Op  S/P Procedure(s) (LRB): CORONARY ARTERY BYPASS GRAFTING (CABG) times three using left internal mammary artery and right saphenous vein harvested with endoscope. (N/A) TRANSESOPHAGEAL ECHOCARDIOGRAM (TEE) (N/A)   Stable day Ambulated around ICU NSR w/ stable BP Labs okay  Plan: Continue current plan  Purcell Nailslarence H Owen, MD 11/05/2015 6:49 PM

## 2015-11-05 NOTE — Anesthesia Postprocedure Evaluation (Signed)
Anesthesia Post Note  Patient: [name] [name]  Procedure(s) Performed: Procedure(s) (LRB): CORONARY ARTERY BYPASS GRAFTING (CABG) times three using left internal mammary artery and right saphenous vein harvested with endoscope. (N/A) TRANSESOPHAGEAL ECHOCARDIOGRAM (TEE) (N/A)  Patient location during evaluation: SICU Anesthesia Type: General Level of consciousness: sedated and patient remains intubated per anesthesia plan Pain management: pain level controlled Vital Signs Assessment: post-procedure vital signs reviewed and stable Respiratory status: patient remains intubated per anesthesia plan and patient on ventilator - see flowsheet for VS Cardiovascular status: stable (phenylephrine gtt) Anesthetic complications: no    Last Vitals:  Filed Vitals:   11/05/15 1000 11/05/15 1100  BP: 102/67 105/73  Pulse: 99 100  Temp:    Resp: 15 26    Last Pain:  Filed Vitals:   11/05/15 1121  PainSc: Asleep                 Cecile HearingStephen Edward Lelah Rennaker

## 2015-11-06 ENCOUNTER — Inpatient Hospital Stay (HOSPITAL_COMMUNITY): Payer: Medicare Other

## 2015-11-06 LAB — CBC
HCT: 27.9 % — ABNORMAL LOW (ref 39.0–52.0)
HEMOGLOBIN: 8.7 g/dL — AB (ref 13.0–17.0)
MCH: 26.9 pg (ref 26.0–34.0)
MCHC: 31.2 g/dL (ref 30.0–36.0)
MCV: 86.4 fL (ref 78.0–100.0)
PLATELETS: 87 10*3/uL — AB (ref 150–400)
RBC: 3.23 MIL/uL — AB (ref 4.22–5.81)
RDW: 16.6 % — ABNORMAL HIGH (ref 11.5–15.5)
WBC: 12.1 10*3/uL — ABNORMAL HIGH (ref 4.0–10.5)

## 2015-11-06 LAB — GLUCOSE, CAPILLARY
GLUCOSE-CAPILLARY: 108 mg/dL — AB (ref 65–99)
GLUCOSE-CAPILLARY: 113 mg/dL — AB (ref 65–99)
Glucose-Capillary: 96 mg/dL (ref 65–99)
Glucose-Capillary: 109 mg/dL — ABNORMAL HIGH (ref 65–99)
Glucose-Capillary: 130 mg/dL — ABNORMAL HIGH (ref 65–99)

## 2015-11-06 LAB — BASIC METABOLIC PANEL
ANION GAP: 8 (ref 5–15)
BUN: 16 mg/dL (ref 6–20)
CALCIUM: 8.4 mg/dL — AB (ref 8.9–10.3)
CO2: 26 mmol/L (ref 22–32)
CREATININE: 1.66 mg/dL — AB (ref 0.61–1.24)
Chloride: 103 mmol/L (ref 101–111)
GFR, EST AFRICAN AMERICAN: 47 mL/min — AB (ref >60)
GFR, EST NON AFRICAN AMERICAN: 40 mL/min — AB (ref >60)
Glucose, Bld: 118 mg/dL — ABNORMAL HIGH (ref 65–99)
Potassium: 4 mmol/L (ref 3.5–5.1)
Sodium: 137 mmol/L (ref 135–145)

## 2015-11-06 MED ORDER — INSULIN ASPART 100 UNIT/ML ~~LOC~~ SOLN
0.0000 [IU] | Freq: Three times a day (TID) | SUBCUTANEOUS | Status: DC
Start: 2015-11-06 — End: 2015-11-08
  Administered 2015-11-06 – 2015-11-07 (×2): 2 [IU] via SUBCUTANEOUS

## 2015-11-06 MED ORDER — MOVING RIGHT ALONG BOOK
Freq: Once | Status: AC
  Administered 2015-11-06: 10:00:00
  Filled 2015-11-06: qty 1

## 2015-11-06 MED ORDER — SODIUM CHLORIDE 0.9 % IV SOLN: 250.0000 mL | INTRAVENOUS | Status: DC | PRN

## 2015-11-06 MED ORDER — PANTOPRAZOLE SODIUM 40 MG PO TBEC
40.0000 mg | DELAYED_RELEASE_TABLET | Freq: Every day | ORAL | Status: DC
  Administered 2015-11-07 – 2015-11-08 (×2): 40 mg via ORAL
  Filled 2015-11-06 (×2): qty 1

## 2015-11-06 MED ORDER — SODIUM CHLORIDE 0.9% FLUSH: 3.0000 mL | INTRAVENOUS | Status: DC | PRN

## 2015-11-06 MED ORDER — IRBESARTAN 150 MG PO TABS
75.0000 mg | ORAL_TABLET | Freq: Every day | ORAL | Status: DC
  Administered 2015-11-06 – 2015-11-08 (×3): 75 mg via ORAL
  Filled 2015-11-06 (×3): qty 1

## 2015-11-06 MED ORDER — SODIUM CHLORIDE 0.9% FLUSH
3.0000 mL | Freq: Two times a day (BID) | INTRAVENOUS | Status: DC
  Administered 2015-11-06 – 2015-11-07 (×3): 3 mL via INTRAVENOUS

## 2015-11-06 MED ORDER — TAMSULOSIN HCL 0.4 MG PO CAPS
0.4000 mg | ORAL_CAPSULE | Freq: Every day | ORAL | Status: DC
  Administered 2015-11-06 – 2015-11-07 (×2): 0.4 mg via ORAL
  Filled 2015-11-06 (×2): qty 1

## 2015-11-06 MED ORDER — ASPIRIN EC 325 MG PO TBEC
325.0000 mg | DELAYED_RELEASE_TABLET | Freq: Every day | ORAL | Status: DC
  Administered 2015-11-06 – 2015-11-08 (×3): 325 mg via ORAL
  Filled 2015-11-06 (×3): qty 1

## 2015-11-06 MED ORDER — PRAVASTATIN SODIUM 20 MG PO TABS
20.0000 mg | ORAL_TABLET | Freq: Every evening | ORAL | Status: DC
  Administered 2015-11-06 – 2015-11-07 (×2): 20 mg via ORAL
  Filled 2015-11-06 (×2): qty 1

## 2015-11-06 MED ORDER — POTASSIUM CHLORIDE CRYS ER 20 MEQ PO TBCR: 20.0000 meq | EXTENDED_RELEASE_TABLET | Freq: Every day | ORAL | Status: DC

## 2015-11-06 MED ORDER — METOPROLOL TARTRATE 25 MG PO TABS
25.0000 mg | ORAL_TABLET | Freq: Two times a day (BID) | ORAL | Status: DC
  Administered 2015-11-06 – 2015-11-08 (×5): 25 mg via ORAL
  Filled 2015-11-06 (×5): qty 1

## 2015-11-06 MED ORDER — EZETIMIBE 10 MG PO TABS
10.0000 mg | ORAL_TABLET | Freq: Every day | ORAL | Status: DC
  Administered 2015-11-07 – 2015-11-08 (×2): 10 mg via ORAL
  Filled 2015-11-06 (×2): qty 1

## 2015-11-06 MED ORDER — ALLOPURINOL 100 MG PO TABS
100.0000 mg | ORAL_TABLET | Freq: Two times a day (BID) | ORAL | Status: DC
  Administered 2015-11-06 – 2015-11-08 (×5): 100 mg via ORAL
  Filled 2015-11-06 (×5): qty 1

## 2015-11-06 MED ORDER — FUROSEMIDE 40 MG PO TABS
40.0000 mg | ORAL_TABLET | Freq: Every day | ORAL | Status: DC
  Administered 2015-11-06 – 2015-11-08 (×3): 40 mg via ORAL
  Filled 2015-11-06 (×3): qty 1

## 2015-11-06 NOTE — Progress Notes (Signed)
Patient denied any pain except soreness on chest incision. Ambulated 300 feet and tolerated it well. Vital signs were stable. Will continue to monitor.

## 2015-11-06 NOTE — Progress Notes (Signed)
[address]       [place] [id number]             [phone]        CARDIOTHORACIC SURGERY PROGRESS NOTE   R2 Days Post-Op Procedure(s) (LRB): CORONARY ARTERY BYPASS GRAFTING (CABG) times three using left internal mammary artery and right saphenous vein harvested with endoscope. (N/A) TRANSESOPHAGEAL ECHOCARDIOGRAM (TEE) (N/A)  Subjective: Looks good and feels well.  Minimal soreness in chest.  Ambulated already this morning.  Appetite improving  Objective: Vital signs: BP Readings from Last 1 Encounters:  11/06/15 124/80   Pulse Readings from Last 1 Encounters:  11/06/15 98   Resp Readings from Last 1 Encounters:  11/06/15 18   Temp Readings from Last 1 Encounters:  11/06/15 98.1 F (36.7 C) Oral    Hemodynamics:    Physical Exam:  Rhythm:   sinus  Breath sounds: clear  Heart sounds:  RRR  Incisions:  Dressing dry, intact  Abdomen:  Soft, non-distended, non-tender  Extremities:  Warm, well-perfused  Chest tubes:  Low volume thin serosanguinous output, no air leak    Intake/Output from previous day: 04/22 0701 - 04/23 0700 In: 1357.9 [P.O.:1200; I.V.:107.9; IV Piggyback:50] Out: 1750 [Urine:1420; Chest Tube:330] Intake/Output this shift: Total I/O In: -  Out: 230 [Urine:130; Chest Tube:100]  Lab Results:  CBC: Recent Labs  11/05/15 1620 11/05/15 1623 11/06/15 0403  WBC 12.4*  --  12.1*  HGB 8.5* 9.9* 8.7*  HCT 26.8* 29.0* 27.9*  PLT 89*  --  87*    BMET:  Recent Labs  11/05/15 0419  11/05/15 1623 11/06/15 0403  NA 138  --  137 137  K 4.7  --  4.3 4.0  CL 107  --  102 103  CO2 22  --   --  26  GLUCOSE 155*  --  118* 118*  BUN 17  --  20 16  CREATININE 1.76*  < > 1.70* 1.66*  CALCIUM 8.0*  --   --  8.4*  < > = values in this interval not displayed.   PT/INR:   Recent Labs  11/04/15 1315  LABPROT 17.4*  INR 1.42    CBG (last 3)   Recent Labs  11/05/15 2326 11/06/15 0439 11/06/15 0729  GLUCAP 91  108* 96    ABG    Component Value Date/Time   PHART 7.372 11/04/2015 1945   PCO2ART 44.1 11/04/2015 1945   PO2ART 72.0* 11/04/2015 1945   HCO3 25.8* 11/04/2015 1945   TCO2 24 11/05/2015 1623   ACIDBASEDEF 6.0* 11/04/2015 1818   O2SAT 94.0 11/04/2015 1945    CXR: Clear w/ very mild bibasilar atelectasis  Assessment/Plan: S/P Procedure(s) (LRB): CORONARY ARTERY BYPASS GRAFTING (CABG) times three using left internal mammary artery and right saphenous vein harvested with endoscope. (N/A) TRANSESOPHAGEAL ECHOCARDIOGRAM (TEE) (N/A)  Doing well POD2 Maintaining NSR w/ stable BP off Neo drip Breathing comfortably w/ O2 sats 97% on O2 3 L/min via [place] Expected post op acute blood loss anemia, Hgb stable 8.7 this morning Expected post op atelectasis, mild Expected post op volume excess, mild Post op thrombocytopenia, platelet count 87k stable Type II diabetes mellitus, excellent glycemic control  Chronic kidney disease, stage IIIA - creatinine stable 1.66 Hypertension   Mobilize  Gentle diuresis  D/C tubes  Increase metoprolol to pre-op dose  Resume ARB at reduced dose  Continue to hold Norvasc for now  Change CBG's and SSI to ac/hs  Transfer  2W   [name], MD 11/06/2015 9:26 AM

## 2015-11-07 ENCOUNTER — Encounter (HOSPITAL_COMMUNITY): Payer: Self-pay | Admitting: Thoracic Surgery (Cardiothoracic Vascular Surgery)

## 2015-11-07 ENCOUNTER — Inpatient Hospital Stay (HOSPITAL_COMMUNITY): Payer: Medicare Other

## 2015-11-07 LAB — GLUCOSE, CAPILLARY
GLUCOSE-CAPILLARY: 109 mg/dL — AB (ref 65–99)
Glucose-Capillary: 88 mg/dL (ref 65–99)
Glucose-Capillary: 115 mg/dL — ABNORMAL HIGH (ref 65–99)
Glucose-Capillary: 99 mg/dL (ref 65–99)

## 2015-11-07 LAB — BASIC METABOLIC PANEL
ANION GAP: 9 (ref 5–15)
BUN: 13 mg/dL (ref 6–20)
CALCIUM: 8.5 mg/dL — AB (ref 8.9–10.3)
CHLORIDE: 107 mmol/L (ref 101–111)
CO2: 24 mmol/L (ref 22–32)
CREATININE: 1.4 mg/dL — AB (ref 0.61–1.24)
GFR calc non Af Amer: 50 mL/min — ABNORMAL LOW (ref >60)
GFR, EST AFRICAN AMERICAN: 58 mL/min — AB (ref >60)
GLUCOSE: 106 mg/dL — AB (ref 65–99)
POTASSIUM: 3.6 mmol/L (ref 3.5–5.1)
SODIUM: 140 mmol/L (ref 135–145)

## 2015-11-07 LAB — CBC
HCT: 28.1 % — ABNORMAL LOW (ref 39.0–52.0)
HEMOGLOBIN: 8.8 g/dL — AB (ref 13.0–17.0)
MCH: 26.8 pg (ref 26.0–34.0)
MCHC: 31.3 g/dL (ref 30.0–36.0)
MCV: 85.7 fL (ref 78.0–100.0)
PLATELETS: 111 10*3/uL — AB (ref 150–400)
RBC: 3.28 MIL/uL — AB (ref 4.22–5.81)
RDW: 16.3 % — ABNORMAL HIGH (ref 11.5–15.5)
WBC: 10.6 10*3/uL — AB (ref 4.0–10.5)

## 2015-11-07 MED ORDER — POTASSIUM CHLORIDE CRYS ER 20 MEQ PO TBCR: 20.0000 meq | EXTENDED_RELEASE_TABLET | ORAL | Status: DC | PRN

## 2015-11-07 MED ORDER — POTASSIUM CHLORIDE CRYS ER 20 MEQ PO TBCR
20.0000 meq | EXTENDED_RELEASE_TABLET | Freq: Every day | ORAL | Status: DC
  Administered 2015-11-08: 20 meq via ORAL
  Filled 2015-11-07: qty 1

## 2015-11-07 MED ORDER — POTASSIUM CHLORIDE CRYS ER 20 MEQ PO TBCR
20.0000 meq | EXTENDED_RELEASE_TABLET | ORAL | Status: AC
  Administered 2015-11-07 (×3): 20 meq via ORAL
  Filled 2015-11-07 (×3): qty 1

## 2015-11-07 MED FILL — Electrolyte-R (PH 7.4) Solution: INTRAVENOUS | Qty: 4000 | Status: AC

## 2015-11-07 MED FILL — Sodium Chloride IV Soln 0.9%: INTRAVENOUS | Qty: 2000 | Status: AC

## 2015-11-07 MED FILL — Heparin Sodium (Porcine) Inj 1000 Unit/ML: INTRAMUSCULAR | Qty: 20 | Status: AC

## 2015-11-07 MED FILL — Lidocaine HCl IV Inj 20 MG/ML: INTRAVENOUS | Qty: 10 | Status: AC

## 2015-11-07 MED FILL — Sodium Bicarbonate IV Soln 8.4%: INTRAVENOUS | Qty: 50 | Status: AC

## 2015-11-07 MED FILL — Mannitol IV Soln 20%: INTRAVENOUS | Qty: 500 | Status: AC

## 2015-11-07 NOTE — Progress Notes (Signed)
Attempted to call and give report on patient transferring to 2W30. Nurse is currently on lunch and will try again in 10 minutes.

## 2015-11-07 NOTE — Progress Notes (Signed)
Patient's epicardial wires removed, residual bleeding from atrial site - pressure held 5 minutes, bleeding controlled, 4 beat run of V Tach noted on telemetry when removing ventricular wires, bedrest for 1 hour, will continue to monitor.  Louie BunWilson,Plumer Mittelstaedt S 3:06 PM

## 2015-11-07 NOTE — Care Management Important Message (Signed)
Important Message  Patient Details  Name: [name] [name] MRN: [id number] Date of Birth: 01-20-1946   Medicare Important Message Given:  Yes    Kyla BalzarineShealy, Missy Baksh Abena 11/07/2015, 12:18 PM

## 2015-11-07 NOTE — Progress Notes (Signed)
K+= 3.6 and creat= 1.4 w/ urine o/p > 30cc/hr; TCTS KCL protocol initiated with 20 mEq K-dur PO Q4rh x 2 doses.

## 2015-11-07 NOTE — Progress Notes (Addendum)
Patient transferred to 2W30 via wheelchair. Patient received by his nurse, Macon LargeMaryanne. Patient's belongings brought with him and placed at the bedside, attempted to make wife aware of transfer, no answer, vss.

## 2015-11-07 NOTE — Discharge Summary (Signed)
Physician Discharge Summary  Patient ID: [name] [name] MRN: [id number] DOB/AGE: [birth year] 69 y.o.  Admit date: 11/04/2015 Discharge date: 11/08/2015  Admission Diagnoses:  Patient Active Problem List   Diagnosis Date Noted  . CKD (chronic kidney disease), stage III   . CAD, multiple vessel 11/01/2015  . Angina effort (HCC) 11/01/2015  . Pain in the chest 09/12/2015  . Dyslipidemia 09/12/2015  . CAD (coronary artery disease), native coronary artery 09/12/2015   Discharge Diagnoses:   Patient Active Problem List   Diagnosis Date Noted  . CKD (chronic kidney disease), stage III   . 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 CAD, multiple vessel 11/01/2015  . Angina effort (HCC) 11/01/2015  . Pain in the chest 09/12/2015  . Dyslipidemia 09/12/2015  . CAD (coronary artery disease), native coronary artery 09/12/2015   Discharged Condition: good  History of Present Illness:  [name] [name] is a 70 year old male with history of coronary artery disease, hypertension, and hyperlipidemia referred for surgical consultation to discuss treatment options for management of severe two-vessel coronary artery disease. The patient states that he was first noted to have nonobstructive coronary artery disease in 1996. He underwent a stress test at that time followed by diagnostic cardiac catheterization. He was noted to have moderate nonobstructive coronary artery disease. He was treated medically. He has done very well from a cardiac standpoint until recently. He was seen in follow-up by his primary care physician approximately 2 months ago at which time he was complaining of body cramps that were felt to be related to statin therapy. His statin dose was adjusted and at the time the patient also mentioned having symptoms of exertional chest discomfort. He describes a pressure-like discomfort across the right side of the chest that radiates to the substernal position. It typically is brought on by walking, working in the ER,  or playing golf. Symptoms have been relatively mild but persistent for several months. He was referred for cardiology consultation and evaluated by Dr. Mayford Knifeurner on 09/12/2015. A stress my view was performed and reportedly negative for ischemia, but the patient's symptoms persisted. Cardiac gated CT angiogram was notable for coronary calcium score of 1951 and severe greater than 70% stenosis of the proximal left anterior descending coronary artery. The patient subsequently was scheduled for elective diagnostic cardiac catheterization which was performed 10/28/2015 by Dr. Katrinka BlazingSmith. The patient was found to have diffuse calcification throughout the coronary circulation with 50% ostial and 60% distal stenosis of the left main coronary artery, greater than 80% long segment calcified proximal stenosis of the left anterior descending coronary artery, heavily calcified eccentric 80-90% stenosis in the ostial left circumflex coronary artery, and normal left ventricular systolic function. The patient's coronary anatomy was felt to be relatively unfavorable for PCI and stenting.  She was evaluated by Dr. Cornelius Moraswen on 11/01/2015 at which time he was in agreement the patient would require coronary bypass grafting procedure for long term benefit and survival.  The risks and benefits of the procedure were explained to the patient and he is agreeable to proceed.      Hospital Course:   [name] [name] [facility].  He was taken to the operating room and underwent CABG x 3 utilizing LIMA to LAD, SVG to OM1, and SVG to OM2.  He also underwent Endoscopic harvest of greater saphenous vein from right thigh.  He tolerated the procedure without difficulty and was taken to the SICU in stable condition.  The patient was extubated  the evening of surgery.  During his stay in the SICU the patient was weaned off a Neo-synephrine drip as tolerated.  His chest tubes and arterial lines were removed without difficulty.  He was  restarted on home dose of Lopressor and Cozaar as tolerated.  He had mild hypervolemia and was treated with diuretics.  He has a history of CKD Stage III and his creatinine was mildy elevated post operatively.  His creatinine level at discharge was 1.4.  He developed mild thrombocytopenia with platelet count slowly trending up.  The most recent level is 111,000.  He remains hemodynamically stable.  He is tachycardic this am but has not received his Lopressor dose yet.  His pacing wires have been removed.  Per Dr. Cornelius Moras, he is felt medically stable for discharge home today.      Significant Diagnostic Studies: angiography:    Mid Cx to Dist Cx lesion, 80% stenosed.  Ost LAD to Prox LAD lesion, 80% stenosed.  Ost LM to LM lesion, 60% stenosed.  Ost LM lesion, 60% stenosed.  Ost Cx lesion, 75% stenosed.  Ost Ramus lesion, 75% stenosed.  Ost RCA lesion, 40% stenosed.   Severe diffuse left coronary calcification.  50% eccentric ostial left main and 60% distal left main.  Diffuse greater than 80% proximal to mid LAD.  Heavily calcified eccentric 80-90% stenosis in the ostial to proximal circumflex.  40% proximal RCA  Low normal LV systolic function with normal LVEDP  Treatments: surgery:    Coronary Artery Bypass Grafting x 3 Left Internal Mammary Artery to Distal Left Anterior Descending Coronary Artery Saphenous Vein Graft to First Obtuse Marginal Branch of Left Circumflex Coronary Artery Sequential Saphenous Vein Graft to Second Obtuse Marginal Branch of Left Circumflex Coronary Artery Endoscopic Vein Harvest from Right Thigh  Disposition: 01-Home or Self Care   Discharge Medications:   Medication List    STOP taking these medications        amLODipine 5 MG tablet  Commonly known as:  NORVASC     nitroGLYCERIN 0.4 MG SL tablet  Commonly known as:  NITROSTAT      TAKE these medications        allopurinol 100 MG  tablet  Commonly known as:  ZYLOPRIM  Take 100 mg by mouth 2 (two) times daily.     aspirin EC 325 MG tablet  Take 1 tablet (325 mg total) by mouth daily.     cholecalciferol 1000 units tablet  Commonly known as:  VITAMIN D  Take 1,000 Units by mouth daily.     CoQ10 100 MG Caps  Take 1 capsule by mouth daily.     ezetimibe 10 MG tablet  Commonly known as:  ZETIA  Take 1 tablet (10 mg total) by mouth daily.     fexofenadine 180 MG tablet  Commonly known as:  ALLEGRA  Take 180 mg by mouth daily as needed. (allergies)     Fish Oil 1200 MG Caps  Take 1 capsule by mouth daily.     furosemide 40 MG tablet  Commonly known as:  LASIX  Take 1 tablet (40 mg total) by mouth daily. For 5 days then stop     loratadine 10 MG tablet  Commonly known as:  CLARITIN  Take 10 mg by mouth daily.     metoprolol tartrate 25 MG tablet  Commonly known as:  LOPRESSOR  Take 1 tablet (25 mg total) by mouth 2 (two) times daily.     multivitamin with  minerals Tabs tablet  Take 1 tablet by mouth daily.     oxyCODONE 5 MG immediate release tablet  Commonly known as:  Oxy IR/ROXICODONE  Take 1-2 tablets (5-10 mg total) by mouth every 4 (four) hours as needed for severe pain.     pantoprazole 40 MG tablet  Commonly known as:  PROTONIX  Take 1 tablet (40 mg total) by mouth daily.     potassium chloride SA 20 MEQ tablet  Commonly known as:  K-DUR,KLOR-CON  Take 1 tablet (20 mEq total) by mouth daily. For 5 days then stop.     pravastatin 20 MG tablet  Commonly known as:  PRAVACHOL  Take 1 tablet (20 mg total) by mouth every evening.     tamsulosin 0.4 MG Caps capsule  Commonly known as:  FLOMAX  Take 0.4 mg by mouth daily after supper.     telmisartan 40 MG tablet  Commonly known as:  MICARDIS  Take 1 tablet (40 mg total) by mouth every morning.       The patient has been discharged on:   1.Beta Blocker:  Yes [ x  ]                              No   [   ]                               If No, reason:  2.Ace Inhibitor/ARB: Yes [ x  ]                                     No  [    ]                                     If No, reason:  3.Statin:   Yes [  x ]                  No  [   ]                  If No, reason:  4.Ecasa:  Yes  [x   ]                  No   [   ]                  If No, reason:   Follow-up Information    Follow up with [name], MD On 12/05/2015.   Specialty:  Cardiothoracic Surgery   Why:  [place] is at 4:30   Contact information:   [address] [employer] [address] [place] [address] [phone]       Follow up with [facility] On 12/05/2015.   Why:  Please get CXR at 4:00   Contact information:   [facility]       Follow up with [facility] R, NP On 11/22/2015.   Specialties:  Cardiology, Radiology   Why:  Appointment time is at 10:00 am   Contact information:   [address] [place] [address] [phone]       Follow up with Pearla Dubonnet, MD.   Specialty:  Internal Medicine  Why:  Call for a follow up appointment regarding further surveillance of HGA1C 6.1 (pre diabetes)   Contact information:   [address] [place] [address] [phone]       Signed: Ardelle Balls 11/08/2015, 8:10 AM

## 2015-11-07 NOTE — Progress Notes (Signed)
[address]       [place] [id number]             [phone]        CARDIOTHORACIC SURGERY PROGRESS NOTE   R3 Days Post-Op Procedure(s) (LRB): CORONARY ARTERY BYPASS GRAFTING (CABG) times three using left internal mammary artery and right saphenous vein harvested with endoscope. (N/A) TRANSESOPHAGEAL ECHOCARDIOGRAM (TEE) (N/A)  Subjective: Looks good and feels well.  Never got a bed on tele unit - still in ICU  Objective: Vital signs: BP Readings from Last 1 Encounters:  11/07/15 121/70   Pulse Readings from Last 1 Encounters:  11/07/15 98   Resp Readings from Last 1 Encounters:  11/07/15 23   Temp Readings from Last 1 Encounters:  11/07/15 99 F (37.2 C) Oral    Hemodynamics:    Physical Exam:  Rhythm:   sinus  Breath sounds: clear  Heart sounds:  RRR  Incisions:  Dressing dry, intact  Abdomen:  Soft, non-distended, non-tender  Extremities:  Warm, well-perfused    Intake/Output from previous day: 04/23 0701 - 04/24 0700 In: 1080 [P.O.:1080] Out: 2375 [Urine:2225; Chest Tube:150] Intake/Output this shift:    Lab Results:  CBC: Recent Labs  11/06/15 0403 11/07/15 0229  WBC 12.1* 10.6*  HGB 8.7* 8.8*  HCT 27.9* 28.1*  PLT 87* 111*    BMET:  Recent Labs  11/06/15 0403 11/07/15 0229  NA 137 140  K 4.0 3.6  CL 103 107  CO2 26 24  GLUCOSE 118* 106*  BUN 16 13  CREATININE 1.66* 1.40*  CALCIUM 8.4* 8.5*     PT/INR:   Recent Labs  11/04/15 1315  LABPROT 17.4*  INR 1.42    CBG (last 3)   Recent Labs  11/06/15 1719 11/06/15 2124 11/07/15 0743  GLUCAP 109* 130* 88    ABG    Component Value Date/Time   PHART 7.372 11/04/2015 1945   PCO2ART 44.1 11/04/2015 1945   PO2ART 72.0* 11/04/2015 1945   HCO3 25.8* 11/04/2015 1945   TCO2 24 11/05/2015 1623   ACIDBASEDEF 6.0* 11/04/2015 1818   O2SAT 94.0 11/04/2015 1945    CXR: CHEST 2 VIEW  COMPARISON: 11/06/2015.  FINDINGS: Interim removal of right  IJ sheath left chest tubes. Prior median sternotomy. Low lung volumes with bibasilar atelectasis and/or infiltrates, left side greater than right. No prominent pleural effusion. No pneumothorax.  IMPRESSION: 1. Interim removal of right IJ sheath and left chest tubes. No pneumothorax. 2. Low lung volumes with bibasilar atelectasis and/or infiltrates, left side greater right. 3. Prior median sternotomy. Stable cardiomegaly. No pulmonary venous congestion.   Electronically Signed  By: Maisie Fushomas Register  On: 11/07/2015 07:32  Assessment/Plan: S/P Procedure(s) (LRB): CORONARY ARTERY BYPASS GRAFTING (CABG) times three using left internal mammary artery and right saphenous vein harvested with endoscope. (N/A) TRANSESOPHAGEAL ECHOCARDIOGRAM (TEE) (N/A)  Doing well POD3 Maintaining NSR w/ stable BP - hypertension under better control Breathing comfortably w/ O2 sats 92-94% on room air Expected post op acute blood loss anemia, Hgb stable 8.8 this morning Expected post op atelectasis, mild Expected post op volume excess, mild Post op thrombocytopenia, platelet count up to 111k  Type II diabetes mellitus, excellent glycemic control  Chronic kidney disease, stage IIIA - creatinine decreased 1.4   Mobilize  Gentle diuresis  Continue to hold Norvasc for now  Awaiting bed on 2W for transfer  Possible d/c home in am tomorrow  Purcell Nailslarence H Owen, MD 11/07/2015 8:32 AM

## 2015-11-08 LAB — POCT I-STAT 3, ART BLOOD GAS (G3+)
Acid-base deficit: 4 mmol/L — ABNORMAL HIGH (ref 0.0–2.0)
BICARBONATE: 23.1 meq/L (ref 20.0–24.0)
O2 SAT: 85 %
TCO2: 25 mmol/L (ref 0–100)
pCO2 arterial: 50.5 mmHg — ABNORMAL HIGH (ref 35.0–45.0)
pH, Arterial: 7.265 — ABNORMAL LOW (ref 7.350–7.450)
pO2, Arterial: 56 mmHg — ABNORMAL LOW (ref 80.0–100.0)

## 2015-11-08 LAB — GLUCOSE, CAPILLARY
Glucose-Capillary: 129 mg/dL — ABNORMAL HIGH (ref 65–99)
Glucose-Capillary: 96 mg/dL (ref 65–99)
Glucose-Capillary: 97 mg/dL (ref 65–99)

## 2015-11-08 LAB — POCT I-STAT 4, (NA,K, GLUC, HGB,HCT)
Glucose, Bld: 154 mg/dL — ABNORMAL HIGH (ref 65–99)
HEMATOCRIT: 33 % — AB (ref 39.0–52.0)
HEMOGLOBIN: 11.2 g/dL — AB (ref 13.0–17.0)
POTASSIUM: 5.1 mmol/L (ref 3.5–5.1)
SODIUM: 139 mmol/L (ref 135–145)

## 2015-11-08 MED ORDER — POTASSIUM CHLORIDE CRYS ER 20 MEQ PO TBCR: 20.0000 meq | EXTENDED_RELEASE_TABLET | Freq: Every day | ORAL | Status: DC

## 2015-11-08 MED ORDER — FUROSEMIDE 40 MG PO TABS: 40.0000 mg | ORAL_TABLET | Freq: Every day | ORAL | Status: DC

## 2015-11-08 MED ORDER — ASPIRIN EC 325 MG PO TBEC: 325.0000 mg | DELAYED_RELEASE_TABLET | Freq: Every day | ORAL | Status: DC

## 2015-11-08 MED ORDER — TELMISARTAN 40 MG PO TABS: 40.0000 mg | ORAL_TABLET | Freq: Every morning | ORAL | Status: DC

## 2015-11-08 MED ORDER — OXYCODONE HCL 5 MG PO TABS: 5.0000 mg | ORAL_TABLET | ORAL | Status: DC | PRN

## 2015-11-08 NOTE — Progress Notes (Signed)
Pt has been discharged home with family. CCMD was notified and telemetry box was removed. IV was removed with no complications. Pt and pt's sister received discharge instructions and all questions were answered. Pt received hard-copy of prescriptions and was instructed to get them filled at pharmacy. Pt left the floor via wheelchair and was [facility]. Pt was in no distress at time of discharge.   Berdine DanceLauren Moffitt RN, BSN

## 2015-11-08 NOTE — Progress Notes (Signed)
CARDIAC REHAB PHASE I   PRE:  Rate/Rhythm: 117 ST  BP:  Supine:   Sitting: 123/65  Standing:    SaO2: 96%RA  MODE:  Ambulation: 550 ft   POST:  Rate/Rhythm: 135 ST  BP:  Supine:   Sitting: 127/72  Standing:    SaO2: 95%RA 0915-1003 Pt walked 550 ft on RA with hand held asst. Gait steady and tolerated well except heart rate up. No DOE or palpitations. Education completed with pt who voiced understanding. Discussed CRP 2 and [facility]. Put on discharge video to view. Discussed with pt watching carbs and making sure Medical Doctor follows his A1C as it was 6.1. Gave heart healthy diet watching carbs.   Luetta Nuttingharlene Renata Gambino, RN BSN  11/08/2015 9:58 AM

## 2015-11-08 NOTE — Progress Notes (Addendum)
      [address]       [place] [id number]             [phone]        4 Days Post-Op Procedure(s) (LRB): CORONARY ARTERY BYPASS GRAFTING (CABG) times three using left internal mammary artery and right saphenous vein harvested with endoscope. (N/A) TRANSESOPHAGEAL ECHOCARDIOGRAM (TEE) (N/A)  Subjective: Patient eating breakfast. He has no complaints this am.  Objective: Vital signs in last 24 hours: Temp:  [97.6 F (36.4 C)-98.8 F (37.1 C)] 98.3 F (36.8 C) (04/25 0423) Pulse Rate:  [95-110] 102 (04/25 0423) Cardiac Rhythm:  [-] Supraventricular tachycardia (04/24 2021) Resp:  [15-52] 18 (04/25 0423) BP: (94-130)/(53-77) 125/73 mmHg (04/25 0423) SpO2:  [92 %-98 %] 94 % (04/25 0423) Weight:  [223 lb 12.8 oz (101.515 kg)] 223 lb 12.8 oz (101.515 kg) (04/25 0423)  Pre op weight 98 kg Current Weight  11/08/15 223 lb 12.8 oz (101.515 kg)      Intake/Output from previous day: 04/24 0701 - 04/25 0700 In: 849 [P.O.:840; I.V.:9] Out: 1725 [Urine:1725]   Physical Exam:  Cardiovascular: Tachycardic Pulmonary: Slightly diminished at bases Abdomen: Soft, non tender, bowel sounds present. Extremities: Mild bilateral lower extremity edema. Wounds: Clean and dry.  No erythema or signs of infection.  Lab Results: CBC: Recent Labs  11/06/15 0403 11/07/15 0229  WBC 12.1* 10.6*  HGB 8.7* 8.8*  HCT 27.9* 28.1*  PLT 87* 111*   BMET:  Recent Labs  11/06/15 0403 11/07/15 0229  NA 137 140  K 4.0 3.6  CL 103 107  CO2 26 24  GLUCOSE 118* 106*  BUN 16 13  CREATININE 1.66* 1.40*  CALCIUM 8.4* 8.5*    PT/INR:  Lab Results  Component Value Date   INR 1.42 11/04/2015   INR 1.06 11/02/2015   INR 1.01 10/25/2015   ABG:  INR: Will add last result for INR, ABG once components are confirmed Will add last 4 CBG results once components are confirmed  Assessment/Plan:  1. CV - ST in the 110's this am. On Lopressor 25 mg bid and Avapro 75 mg  daily. Per Dr. Cornelius Moraswen, restart low dose Micardis at discharge. 2.  Pulmonary - On 2 liters of oxygen. Will wean to room air. Encourage incentive spirometer 3. Volume Overload - On Lasix 40 mg daily and will continue for at least 5 days after discharge. 4.  Acute blood loss anemia - H and H yesterday stable at 8.8 and 28.1 5. Thrombocytopenia-platelets yesterday up to 111,000 6. Creatinine yesterday stable at 1.4 (1.8 pre op) 7. Per Dr. Cornelius Moraswen, discharge today  ZIMMERMAN,DONIELLE MPA-C 11/08/2015,7:51 AM  I have seen and examined the patient and agree with the assessment and plan as outlined.  D/C instructions given  Purcell Nailslarence H Owen, MD 11/08/2015

## 2015-11-17 ENCOUNTER — Telehealth: Payer: Self-pay | Admitting: Cardiology

## 2015-11-17 NOTE — Telephone Encounter (Signed)
Received a call from Dr. Bosie ClosSchooler office today, that Mr. [name] [name] cancel his GI appointment due to he just had bypass surgery.  He is going to call them back at a later date to reschedule.

## 2015-11-22 ENCOUNTER — Ambulatory Visit (INDEPENDENT_AMBULATORY_CARE_PROVIDER_SITE_OTHER): Payer: Medicare Other | Admitting: Cardiology

## 2015-11-22 ENCOUNTER — Encounter: Payer: Self-pay | Admitting: Cardiology

## 2015-11-22 VITALS — BP 98/70 | HR 97 | Ht 66.0 in | Wt 207.0 lb

## 2015-11-22 DIAGNOSIS — I200 Unstable angina: Secondary | ICD-10-CM

## 2015-11-22 DIAGNOSIS — I10 Essential (primary) hypertension: Secondary | ICD-10-CM | POA: Diagnosis not present

## 2015-11-22 DIAGNOSIS — Z951 Presence of aortocoronary bypass graft: Secondary | ICD-10-CM | POA: Diagnosis not present

## 2015-11-22 DIAGNOSIS — D649 Anemia, unspecified: Secondary | ICD-10-CM

## 2015-11-22 DIAGNOSIS — R42 Dizziness and giddiness: Secondary | ICD-10-CM

## 2015-11-22 DIAGNOSIS — E785 Hyperlipidemia, unspecified: Secondary | ICD-10-CM

## 2015-11-22 DIAGNOSIS — I2510 Atherosclerotic heart disease of native coronary artery without angina pectoris: Secondary | ICD-10-CM

## 2015-11-22 DIAGNOSIS — D62 Acute posthemorrhagic anemia: Secondary | ICD-10-CM

## 2015-11-22 DIAGNOSIS — N183 Chronic kidney disease, stage 3 (moderate): Secondary | ICD-10-CM

## 2015-11-22 LAB — BASIC METABOLIC PANEL
BUN: 21 mg/dL (ref 7–25)
CALCIUM: 9.5 mg/dL (ref 8.6–10.3)
CHLORIDE: 104 mmol/L (ref 98–110)
CO2: 23 mmol/L (ref 20–31)
Creat: 1.81 mg/dL — ABNORMAL HIGH (ref 0.70–1.25)
Glucose, Bld: 102 mg/dL — ABNORMAL HIGH (ref 65–99)
Potassium: 4.6 mmol/L (ref 3.5–5.3)
SODIUM: 137 mmol/L (ref 135–146)

## 2015-11-22 LAB — CBC
HCT: 34.9 % — ABNORMAL LOW (ref 38.5–50.0)
HEMOGLOBIN: 11.2 g/dL — AB (ref 13.2–17.1)
MCH: 26.4 pg — AB (ref 27.0–33.0)
MCHC: 32.1 g/dL (ref 32.0–36.0)
MCV: 82.1 fL (ref 80.0–100.0)
MPV: 9.4 fL (ref 7.5–12.5)
PLATELETS: 320 10*3/uL (ref 140–400)
RBC: 4.25 MIL/uL (ref 4.20–5.80)
RDW: 16 % — ABNORMAL HIGH (ref 11.0–15.0)
WBC: 7.4 10*3/uL (ref 3.8–10.8)

## 2015-11-22 NOTE — Progress Notes (Signed)
Cardiology Office Note   Date:  11/22/2015   ID:  [name], [name] 1945/10/18, MRN [id number]  PCP:  Pearla Dubonnet, MD  Cardiologist:  Dr. Mayford Knife   TCTS: Dr. Cornelius Moras  Chief Complaint  Patient presents with  . Hospitalization Follow-up    post CABG.       History of Present Illness: [name] [name] is a 70 y.o. male who presents for post hospitalization after CABG.    Pt had been having weakness and chest discomfort.  myoview was neg for ischemia but Chest pain continued. cardiac CT with coronary calcium score 1951 and severe > 70 % stenosis in prox LAD. Possible significant disease in the ostial RCA and LCX. Possible balanced ischemia on nuc. Also with thickening of distal esophageal wall, pt to see GI Dr. [name] 11/18/15. DR. Turner then arranged cardiac cath   Cath with severe CAD see below.  He was seen by TCTS and underwent CABG by Dr. Cornelius Moras 11/04/15 with LIMA to distal LAD, SVG to 1st OM of LCX. , seq VG to 2nd OM of LCX.  [name] He had normal LV systolic function.  He did well post op and was discharged 11/08/15.  + anemia and Cr at discharge 1.40.   Today no complaints, has numbness and tingling of chest.  No chest pain, no SOB.  Is walking 3-4 times a day for 5-77min.  Appetite is improving but still not his usual.  No palpitations.  Today with some dizziness and low BP.  Recently started on proscar for sluggish urination.  This may be cause.  He has been taking for 3 days.    On discharge he was anemic and CKD 3 K+ was borderline.  Will recheck labs.       Past Medical History  Diagnosis Date  . History of kidney stones   . Hypertension     takes Norvasc and Micardis daily  . Hyperlipidemia     takes Crestor daily  . Pneumonia     history of double 30+yrs ago  . History of seasonal allergies     takes Loratadine daily  . Joint pain   . Chronic back pain     herniated disc  . GERD (gastroesophageal reflux disease)     takes Omeprazole daily  . Diverticulosis   .  Nocturia   . Muscle cramps     takes KDUr and Mag Ox as a trial to see if it will help  . History of gout     takes Allopurinol daily  . Kidney stones   . CAD (coronary artery disease), native coronary artery 09/12/2015  . 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 CKD (chronic kidney disease), stage III     Past Surgical History  Procedure Laterality Date  . Cystoscopy  1993  . Foot surgery  51yrs ago    right-removed bone spur   . Colonoscopy    . Esophagogastroduodenoscopy      with diliation  . Lithotripsy      x 2  . Vasectomy  30yrs ago  . Lumbar laminectomy  03-07-2012    with fusion  . Cystoscopy with ureteroscopy  07/28/2012    Procedure: CYSTOSCOPY WITH URETEROSCOPY;  Surgeon: [name], MD;  Location: [facility];  Service: Urology;  Laterality: Left;     . Holmium laser application  07/28/2012    Procedure: HOLMIUM LASER APPLICATION;  Surgeon: [name], MD;  Location: [facility];  Service: Urology;  Laterality: Left;  left lithotripsy  . Cardiac catheterization N/A 10/28/2015    Procedure: Left Heart Cath and Coronary Angiography;  Surgeon: [name], MD;  Location: [facility] INVASIVE CV LAB;  Service: Cardiovascular;  Laterality: N/A;  . Back surgery    . Tonsillectomy    . Coronary artery bypass graft N/A 11/04/2015    Procedure: CORONARY ARTERY BYPASS GRAFTING (CABG) times three using left internal mammary artery and right saphenous vein harvested with endoscope.;  Surgeon: [name], MD;  Location: [facility] OR;  Service: Open Heart Surgery;  Laterality: N/A;  . Tee without cardioversion N/A 11/04/2015    Procedure: TRANSESOPHAGEAL ECHOCARDIOGRAM (TEE);  Surgeon: [name], MD;  Location: [facility] OR;  Service: Open Heart Surgery;  Laterality: N/A;     Current Outpatient Prescriptions  Medication Sig Dispense Refill  . allopurinol (ZYLOPRIM) 100 MG tablet Take 100 mg by mouth 2 (two) times daily.     [name] aspirin EC 325 MG tablet  Take 1 tablet (325 mg total) by mouth daily.    . cholecalciferol (VITAMIN D) 1000 UNITS tablet Take 1,000 Units by mouth daily.    . Coenzyme Q10 (COQ10) 100 MG CAPS Take 1 capsule by mouth daily.     [name] ezetimibe (ZETIA) 10 MG tablet Take 10 mg by mouth daily.    . fexofenadine (ALLEGRA) 180 MG tablet Take 180 mg by mouth daily as needed. (allergies)    . finasteride (PROSCAR) 5 MG tablet Take 5 mg by mouth daily.  0  . loratadine (CLARITIN) 10 MG tablet Take 10 mg by mouth daily.    . metoprolol tartrate (LOPRESSOR) 25 MG tablet Take 1 tablet (25 mg total) by mouth 2 (two) times daily. 180 tablet 3  . Multiple Vitamin (MULTIVITAMIN WITH MINERALS) TABS Take 1 tablet by mouth daily.    [name] oxyCODONE (OXY IR/ROXICODONE) 5 MG immediate release tablet Take 1-2 tablets (5-10 mg total) by mouth every 4 (four) hours as needed for severe pain. 30 tablet 0  . pantoprazole (PROTONIX) 40 MG tablet Take 40 mg by mouth daily.    . pravastatin (PRAVACHOL) 20 MG tablet Take 1 tablet (20 mg total) by mouth every evening. 30 tablet 11  . Tamsulosin HCl (FLOMAX) 0.4 MG CAPS Take 0.4 mg by mouth daily after supper.    . telmisartan (MICARDIS) 40 MG tablet Take 1 tablet (40 mg total) by mouth every morning. 30 tablet 1   No current facility-administered medications for this visit.    Allergies:   Atorvastatin; Hctz; and Rosuvastatin    Social History:  The patient  reports that he has never smoked. He has never used smokeless tobacco. He reports that he does not drink alcohol or use illicit drugs.   Family History:  The patient's family history includes Dementia in his mother; Prostate cancer in his father.    ROS:  General:no colds or fevers, + weight loss, has had decreased appetite since surgery Skin:no rashes or ulcers HEENT:no blurred vision, no congestion CV:see HPI PUL:see HPI GI:no diarrhea constipation or melena, no indigestion GU:no hematuria, no dysuria, low flow but improving with Proscar.    MS:no joint pain, no claudication Neuro:no syncope, no lightheadedness Endo:no diabetes, no thyroid disease  Wt Readings from Last 3 Encounters:  11/22/15 207 lb (93.895 kg)  11/08/15 223 lb 12.8 oz (101.515 kg)  11/02/15 216 lb 12.8 oz (98.34 kg)  PHYSICAL EXAM: VS:  BP 98/70 mmHg  Pulse 97  Ht 5\' 6"  (1.676 m)  Wt 207 lb (93.895 kg)  BMI 33.43 kg/m2  SpO2 98% , BMI Body mass index is 33.43 kg/(m^2). General:Pleasant affect, NAD Skin:Warm and dry, brisk capillary refill HEENT:normocephalic, sclera clear, mucus membranes moist Neck:supple, no JVD, no bruits  Heart:S1S2 RRR without murmur, gallup, rub or click Lungs:clear without rales, rhonchi, or wheezes NWG:NFAOAbd:soft, non tender, + BS, do not palpate liver spleen or masses Ext:no lower ext edema, 2+ pedal pulses, 2+ radial pulses Neuro:alert and oriented, MAE, follows commands, + facial symmetry  Incisions stable no drainage  EKG:  EKG is ordered today. The ekg ordered today demonstrates SR with LBBB and T wave inversion II, III, AVF.  New from last EKG   Recent Labs: 11/02/2015: ALT 39 11/05/2015: Magnesium 2.5* 11/07/2015: BUN 13; Creatinine, Ser 1.40*; Hemoglobin 8.8*; Platelets 111*; Potassium 3.6; Sodium 140    Lipid Panel    Component Value Date/Time   CHOL 205* 10/14/2015 0839   TRIG 175* 10/14/2015 0839   HDL 45 10/14/2015 0839   CHOLHDL 4.6 10/14/2015 0839   VLDL 35* 10/14/2015 0839   LDLCALC 125 10/14/2015 0839       Other studies Reviewed: Additional studies/ records that were reviewed today include: . CARDIAC CATH: Conclusion     Mid Cx to Dist Cx lesion, 80% stenosed.  Ost LAD to Prox LAD lesion, 80% stenosed.  Ost LM to LM lesion, 60% stenosed.  Ost LM lesion, 60% stenosed.  Ost Cx lesion, 75% stenosed.  Ost Ramus lesion, 75% stenosed.  Ost RCA lesion, 40% stenosed.   Severe diffuse left coronary calcification.  50% eccentric ostial left main and 60% distal left main.  Diffuse  greater than 80% proximal to mid LAD.  Heavily calcified eccentric 80-90% stenosis in the ostial to proximal circumflex.  40% proximal RCA  Low normal LV systolic function with normal LVEDP  RECOMMENDATIONS:   TCTS consultation to consider coronary bypass grafting.    Vascular dopplers; Summary:  - Findings consistent with 1- 39 percent stenosis involving the  right internal carotid artery and the left internal carotid  artery.  Bilaterally vertebral artery are antegrade. [place] evaluation waveforms remained normal with both radial  and ulnar arteries with compression at rest. - ABI&'s and Doppler waveforms bilaterally are within normal limits.  CABG: Procedure:   Coronary Artery Bypass Grafting x 3 Left Internal Mammary Artery to Distal Left Anterior Descending Coronary Artery Saphenous Vein Graft to First Obtuse Marginal Branch of Left Circumflex Coronary Artery Sequential Saphenous Vein Graft to Second Obtuse Marginal Branch of Left Circumflex Coronary Artery Endoscopic Vein Harvest from Right Thigh  Operative Findings:  Normal LV systolic function  Mild mitral regurgitation  Small caliber but o/w good quality LIMA conduit for grafting  Good quality SVG conduit for grafting  Good quality target vessels for grafting    ASSESSMENT AND PLAN:  1.  CAD with CABG X 3 11/04/15- progressing well will check follow up labs.   2. HTN- borderline, will check labs may need to decrease micardis  3. Hyperlipidemia continue zetia and pravachol  4. Anemia due to blood loss with surgery check labs with dizziness..     Current medicines are reviewed with the patient today.  The patient Has no concerns regarding medicines.  The following changes have been made:  See above Labs/ tests ordered today include:see above  Disposition:   FU:  see above  Signed,  Leone Brand, NP  11/22/2015 10:41 AM      [facility] [address] [place], [place], [place]  [address]/ [address] [place], [place] Phone: [phone]; Fax: [phone]  [phone]

## 2015-11-22 NOTE — Patient Instructions (Addendum)
Medication Instructions:  Your physician recommends that you continue on your current medications as directed. Please refer to the Current Medication list given to you today.   Labwork: TODAY:  BMP & CBC  Testing/Procedures: None ordered  Follow-Up: Your physician recommends that you schedule a follow-up appointment in: 6-8 WEEKS WITH DR. Mayford KnifeURNER    Any Other Special Instructions Will Be Listed Below (If Applicable).    If you need a refill on your cardiac medications before your next appointment, please call your pharmacy.

## 2015-11-23 ENCOUNTER — Telehealth: Payer: Self-pay | Admitting: *Deleted

## 2015-11-23 DIAGNOSIS — I10 Essential (primary) hypertension: Secondary | ICD-10-CM

## 2015-11-23 MED ORDER — TELMISARTAN 20 MG PO TABS: 20.0000 mg | ORAL_TABLET | Freq: Every morning | ORAL | Status: DC

## 2015-11-23 NOTE — Telephone Encounter (Signed)
-----   Message from Leone BrandLaura R Ingold, NP sent at 11/22/2015  9:48 PM EDT ----- hgb has improved.  Kidney function stressed, decrease micardis to 20 mg daily. This will help BP as well.  Recheck BMP in 3 weeks.  Thanks.

## 2015-11-23 NOTE — Telephone Encounter (Signed)
Pt aware of lab results.  He will decrease Micardis to 20 mg qd and repeat bmet 12/13/15.  Order in EPIC Pt verbalized understanding

## 2015-12-01 ENCOUNTER — Other Ambulatory Visit: Payer: Self-pay | Admitting: Thoracic Surgery (Cardiothoracic Vascular Surgery)

## 2015-12-01 DIAGNOSIS — Z951 Presence of aortocoronary bypass graft: Secondary | ICD-10-CM

## 2015-12-05 ENCOUNTER — Ambulatory Visit
Admission: RE | Admit: 2015-12-05 | Discharge: 2015-12-05 | Disposition: A | Discharge status: Home / Self Care | Payer: Medicare Other | Source: Ambulatory Visit | Attending: Thoracic Surgery (Cardiothoracic Vascular Surgery) | Admitting: Thoracic Surgery (Cardiothoracic Vascular Surgery)

## 2015-12-05 ENCOUNTER — Ambulatory Visit (INDEPENDENT_AMBULATORY_CARE_PROVIDER_SITE_OTHER): Payer: Self-pay | Admitting: Thoracic Surgery (Cardiothoracic Vascular Surgery)

## 2015-12-05 ENCOUNTER — Encounter: Payer: Self-pay | Admitting: Thoracic Surgery (Cardiothoracic Vascular Surgery)

## 2015-12-05 VITALS — BP 136/82 | HR 110 | Resp 20 | Ht 66.0 in | Wt 207.0 lb

## 2015-12-05 DIAGNOSIS — Z951 Presence of aortocoronary bypass graft: Secondary | ICD-10-CM

## 2015-12-05 DIAGNOSIS — I2510 Atherosclerotic heart disease of native coronary artery without angina pectoris: Secondary | ICD-10-CM

## 2015-12-05 NOTE — Progress Notes (Signed)
[address]       [place] [address]             [phone]     CARDIOTHORACIC SURGERY OFFICE NOTE  Referring Provider is [name], MD  Primary Cardiologist is Quintella Reichert, MD PCP is Pearla Dubonnet, MD   HPI:  Patient returns to the office today for routine follow-up status post coronary artery bypass grafting 3 on 11/04/2015 for severe multi vessel coronary artery disease with exertional angina.  His early postoperative recovery in the hospital was uncomplicated and he was discharged home on the fourth postoperative day. Since hospital discharge he was seen in follow-up by Dr. Kevan Ny and Proscar was added because of urinary retention.  The patient states he had a similar problem in the past with previous surgery. Symptoms have improved but have not resolved completely. The patient has also been seen in follow-up by Nada Boozer at [facility] on 11/22/2015.  His blood pressure was somewhat low at that time and his dose of Micardis was decreased. Follow-up blood work revealed appropriate increase in hemoglobin. His creatinine was up slightly to 1.8. He is scheduled to return to their office for follow-up next week for repeat blood work. He returns to our office for routine follow-up. Overall the patient reports he is doing very well. He has had minimal pain in his chest and really complains only that the skin is sensitive along his sternal incision. He has no shortness of breath. He is sleeping well at night. Appetite is good. He is walking every day.    Current Outpatient Prescriptions  Medication Sig Dispense Refill  . allopurinol (ZYLOPRIM) 100 MG tablet Take 100 mg by mouth 2 (two) times daily.     [name] aspirin EC 325 MG tablet Take 1 tablet (325 mg total) by mouth daily.    . cholecalciferol (VITAMIN D) 1000 UNITS tablet Take 1,000 Units by mouth daily.    . Coenzyme Q10 (COQ10) 100 MG CAPS Take 1 capsule by mouth daily.     [name] ezetimibe (ZETIA)  10 MG tablet Take 10 mg by mouth daily.    . fexofenadine (ALLEGRA) 180 MG tablet Take 180 mg by mouth daily as needed. (allergies)    . finasteride (PROSCAR) 5 MG tablet Take 5 mg by mouth daily.  0  . loratadine (CLARITIN) 10 MG tablet Take 10 mg by mouth daily.    . metoprolol tartrate (LOPRESSOR) 25 MG tablet Take 1 tablet (25 mg total) by mouth 2 (two) times daily. 180 tablet 3  . Multiple Vitamin (MULTIVITAMIN WITH MINERALS) TABS Take 1 tablet by mouth daily.    . pantoprazole (PROTONIX) 40 MG tablet Take 40 mg by mouth daily.    . pravastatin (PRAVACHOL) 20 MG tablet Take 1 tablet (20 mg total) by mouth every evening. 30 tablet 11  . Tamsulosin HCl (FLOMAX) 0.4 MG CAPS Take 0.4 mg by mouth daily after supper.    . telmisartan (MICARDIS) 20 MG tablet Take 1 tablet (20 mg total) by mouth every morning. 90 tablet 3  . oxyCODONE (OXY IR/ROXICODONE) 5 MG immediate release tablet Take 1-2 tablets (5-10 mg total) by mouth every 4 (four) hours as needed for severe pain. (Patient not taking: Reported on 12/05/2015) 30 tablet 0   No current facility-administered medications for this visit.      Physical Exam:   BP 136/82 mmHg  Pulse 110  Resp 20  Ht  (1.676  m)  Wt 207 lb (93.895 kg)  BMI 33.43 kg/m2  SpO2 97%  General:  Well appearing  Chest:   Clear to auscultation  CV:   Regular rate and rhythm without murmur  Incisions:  Healing nicely, sternum is stable  Abdomen:  Soft and nontender  Extremities:  Warm and well perfused, no lower extremity edema  Diagnostic Tests:  CHEST 2 VIEW  COMPARISON: Chest x-ray dated 11/07/2015.  FINDINGS: Patient is status post median sternotomy for CABG. Sternotomy wires appear intact and stable alignment. Heart size is normal. Overall cardiomediastinal silhouette is within normal limits in size and configuration.  Perhaps mild scarring within the left perihilar lung. Lungs otherwise clear. No evidence of pneumonia. No pleural effusion  or pneumothorax. Mild degenerative spurring noted within the thoracic spine. No acute - appearing osseous abnormality.  IMPRESSION: No evidence of acute cardiopulmonary abnormality.   Electronically Signed  By: Bary RichardStan Maynard M.D.  On: 12/05/2015 16:35   Impression:  Patient is doing well approximately one month status post coronary artery bypass grafting. He has had some symptoms consistent with urinary retention that has improved but not resolved with the addition of Proscar.  The patient has known chronic kidney disease and his creatinine was increased slightly to 1.8 when it was checked recently. Clinically the patient looks to be doing remarkably well.   Plan:  We have not recommended any changes to the patient's current medications at this time. I have encouraged the patient to enroll and participate in the outpatient cardiac rehabilitation program. He has been reminded to keep his appointment at Dr. Norris Crossurner's office next week for repeat blood work to check his renal function. He has been advised to contact Dr. Kevan NyGates or his urologist if his symptoms of urinary retention do not resolve soon.  The patient has been reminded to continue to refrain from any heavy lifting or strenuous use of his arms or shoulders for at least another 2 months. I think he may resume driving an automobile. All of his questions have been addressed. The patient will return for routine follow-up next April, approximately 1 year following his original surgery. He will call and return sooner should specific problems or questions arise.    Salvatore Decentlarence H. Cornelius Moraswen, MD 12/05/2015 4:51 PM

## 2015-12-05 NOTE — Patient Instructions (Signed)
Continue all previous medications without any changes at this time  Continue to avoid any heavy lifting or strenuous use of your arms or shoulders for at least a total of three months from the time of surgery.  After three months you may gradually increase how much you lift or otherwise use your arms or chest as tolerated, with limits based upon whether or not activities lead to the return of significant discomfort.  You may return to driving an automobile as long as you are no longer requiring oral narcotic pain relievers during the daytime.  It would be wise to start driving only short distances during the daylight and gradually increase from there as you feel comfortable.  You are encouraged to enroll and participate in the outpatient cardiac rehab program beginning as soon as practical.  Contact your primary care physician and/or your urologist if you have more problems voiding urine

## 2015-12-08 ENCOUNTER — Encounter: Payer: Self-pay | Admitting: *Deleted

## 2015-12-08 NOTE — Progress Notes (Signed)
Patient ID: [name] [name], male   DOB: 25-Dec-1945, 70 y.o.   MRN: [id number] A referral was faxed to [facility] to contact Mr. Ronning to begin Phase II per the request of Dr. Tressie Stalkerlarence Owen.

## 2015-12-13 ENCOUNTER — Other Ambulatory Visit (INDEPENDENT_AMBULATORY_CARE_PROVIDER_SITE_OTHER): Payer: Medicare Other | Admitting: *Deleted

## 2015-12-13 DIAGNOSIS — I10 Essential (primary) hypertension: Secondary | ICD-10-CM | POA: Diagnosis not present

## 2015-12-13 LAB — BASIC METABOLIC PANEL
BUN: 13 mg/dL (ref 7–25)
CHLORIDE: 105 mmol/L (ref 98–110)
CO2: 24 mmol/L (ref 20–31)
CREATININE: 1.49 mg/dL — AB (ref 0.70–1.25)
Calcium: 9.1 mg/dL (ref 8.6–10.3)
GLUCOSE: 95 mg/dL (ref 65–99)
POTASSIUM: 4 mmol/L (ref 3.5–5.3)
Sodium: 141 mmol/L (ref 135–146)

## 2015-12-19 ENCOUNTER — Other Ambulatory Visit (INDEPENDENT_AMBULATORY_CARE_PROVIDER_SITE_OTHER): Payer: Medicare Other | Admitting: *Deleted

## 2015-12-19 ENCOUNTER — Telehealth: Payer: Self-pay | Admitting: Cardiology

## 2015-12-19 DIAGNOSIS — E785 Hyperlipidemia, unspecified: Secondary | ICD-10-CM | POA: Diagnosis not present

## 2015-12-19 LAB — LIPID PANEL
CHOL/HDL RATIO: 3.4 ratio (ref <=5.0)
Cholesterol: 138 mg/dL (ref 125–200)
HDL: 41 mg/dL (ref >=40)
LDL Cholesterol: 66 mg/dL (ref <130)
Triglycerides: 155 mg/dL — ABNORMAL HIGH (ref <150)
VLDL: 31 mg/dL — AB (ref <30)

## 2015-12-19 LAB — HEPATIC FUNCTION PANEL
ALT: 19 U/L (ref 9–46)
AST: 20 U/L (ref 10–35)
Albumin: 3.7 g/dL (ref 3.6–5.1)
Alkaline Phosphatase: 93 U/L (ref 40–115)
Bilirubin, Direct: 0.1 mg/dL
Indirect Bilirubin: 0.4 mg/dL (ref 0.2–1.2)
Total Bilirubin: 0.5 mg/dL (ref 0.2–1.2)
Total Protein: 6.7 g/dL (ref 6.1–8.1)

## 2015-12-19 NOTE — Telephone Encounter (Signed)
New message     1. What dental office are you calling from? Dr. Joaquim LaiPaul Shaelor  2. What is your office phone and fax number? [phone]  3. What type of procedure is the patient having performed? cleaning  4. What date is procedure scheduled? 01/05/16  5. What is your question (ex. Antibiotics prior to procedure, holding medication-we need to know how long dentist wants pt to hold med)? The dentist office is asking if the pt should go to appt or any medications the pt should take prior to appt or any medications the pt should stop

## 2015-12-20 NOTE — Telephone Encounter (Signed)
No SBE prophylaxis needed.  Dentist needs to determine if patient needs to hold ASA.  OK to do if needed

## 2015-12-21 NOTE — Telephone Encounter (Signed)
Called and confirmed fax number with dentist office. Faxed to number [phone].

## 2016-01-19 ENCOUNTER — Encounter (INDEPENDENT_AMBULATORY_CARE_PROVIDER_SITE_OTHER): Payer: Self-pay

## 2016-01-19 ENCOUNTER — Ambulatory Visit (INDEPENDENT_AMBULATORY_CARE_PROVIDER_SITE_OTHER): Payer: Medicare Other | Admitting: Cardiology

## 2016-01-19 ENCOUNTER — Encounter: Payer: Self-pay | Admitting: Cardiology

## 2016-01-19 VITALS — BP 126/80 | HR 80 | Ht 66.0 in | Wt 208.0 lb

## 2016-01-19 DIAGNOSIS — R059 Cough, unspecified: Secondary | ICD-10-CM | POA: Insufficient documentation

## 2016-01-19 DIAGNOSIS — R339 Retention of urine, unspecified: Secondary | ICD-10-CM | POA: Insufficient documentation

## 2016-01-19 DIAGNOSIS — I2510 Atherosclerotic heart disease of native coronary artery without angina pectoris: Secondary | ICD-10-CM

## 2016-01-19 DIAGNOSIS — E785 Hyperlipidemia, unspecified: Secondary | ICD-10-CM | POA: Diagnosis not present

## 2016-01-19 DIAGNOSIS — M129 Arthropathy, unspecified: Secondary | ICD-10-CM | POA: Insufficient documentation

## 2016-01-19 DIAGNOSIS — M549 Dorsalgia, unspecified: Secondary | ICD-10-CM | POA: Insufficient documentation

## 2016-01-19 DIAGNOSIS — R252 Cramp and spasm: Secondary | ICD-10-CM | POA: Insufficient documentation

## 2016-01-19 DIAGNOSIS — M609 Myositis, unspecified: Secondary | ICD-10-CM | POA: Insufficient documentation

## 2016-01-19 DIAGNOSIS — M5430 Sciatica, unspecified side: Secondary | ICD-10-CM | POA: Insufficient documentation

## 2016-01-19 DIAGNOSIS — M6530 Trigger finger, unspecified finger: Secondary | ICD-10-CM | POA: Insufficient documentation

## 2016-01-19 DIAGNOSIS — I10 Essential (primary) hypertension: Secondary | ICD-10-CM | POA: Diagnosis not present

## 2016-01-19 DIAGNOSIS — Z0001 Encounter for general adult medical examination with abnormal findings: Secondary | ICD-10-CM | POA: Insufficient documentation

## 2016-01-19 DIAGNOSIS — E782 Mixed hyperlipidemia: Secondary | ICD-10-CM

## 2016-01-19 DIAGNOSIS — R05 Cough: Secondary | ICD-10-CM | POA: Insufficient documentation

## 2016-01-19 DIAGNOSIS — J309 Allergic rhinitis, unspecified: Secondary | ICD-10-CM | POA: Insufficient documentation

## 2016-01-19 DIAGNOSIS — Z0000 Encounter for general adult medical examination without abnormal findings: Secondary | ICD-10-CM | POA: Insufficient documentation

## 2016-01-19 DIAGNOSIS — M79643 Pain in unspecified hand: Secondary | ICD-10-CM | POA: Insufficient documentation

## 2016-01-19 DIAGNOSIS — K219 Gastro-esophageal reflux disease without esophagitis: Secondary | ICD-10-CM | POA: Insufficient documentation

## 2016-01-19 DIAGNOSIS — M779 Enthesopathy, unspecified: Secondary | ICD-10-CM | POA: Insufficient documentation

## 2016-01-19 DIAGNOSIS — E669 Obesity, unspecified: Secondary | ICD-10-CM | POA: Insufficient documentation

## 2016-01-19 DIAGNOSIS — R749 Abnormal serum enzyme level, unspecified: Secondary | ICD-10-CM | POA: Insufficient documentation

## 2016-01-19 DIAGNOSIS — N400 Enlarged prostate without lower urinary tract symptoms: Secondary | ICD-10-CM | POA: Insufficient documentation

## 2016-01-19 DIAGNOSIS — M1000 Idiopathic gout, unspecified site: Secondary | ICD-10-CM | POA: Insufficient documentation

## 2016-01-19 DIAGNOSIS — J310 Chronic rhinitis: Secondary | ICD-10-CM | POA: Insufficient documentation

## 2016-01-19 DIAGNOSIS — M5415 Radiculopathy, thoracolumbar region: Secondary | ICD-10-CM | POA: Insufficient documentation

## 2016-01-19 DIAGNOSIS — Z79899 Other long term (current) drug therapy: Secondary | ICD-10-CM | POA: Insufficient documentation

## 2016-01-19 DIAGNOSIS — Z951 Presence of aortocoronary bypass graft: Secondary | ICD-10-CM

## 2016-01-19 DIAGNOSIS — L570 Actinic keratosis: Secondary | ICD-10-CM | POA: Insufficient documentation

## 2016-01-19 DIAGNOSIS — D509 Iron deficiency anemia, unspecified: Secondary | ICD-10-CM | POA: Insufficient documentation

## 2016-01-19 DIAGNOSIS — M109 Gout, unspecified: Secondary | ICD-10-CM | POA: Insufficient documentation

## 2016-01-19 MED ORDER — ASPIRIN 81 MG PO TBEC: 81.0000 mg | DELAYED_RELEASE_TABLET | Freq: Every day | ORAL | Status: AC

## 2016-01-19 NOTE — Progress Notes (Signed)
Cardiology Office Note    Date:  01/19/2016   ID:  [name], [name] [name] 15, 1947, MRN [id number]  PCP:  Pearla Dubonnet, MD  Cardiologist:  Armanda Magic, MD   Chief Complaint  Patient presents with  . Coronary Artery Disease  . Hypertension  . Hyperlipidemia    History of Present Illness:  [name] [name] is a 70 y.o. male  who presents for followup of chest pain. he underwent myoview which was neg for ischemia but Chest pain continued. Cardiac CT showed coronary calcium score 1951 and severe > 70 % stenosis in prox LAD. Possible significant disease in the ostial RCA and LCX. It was felt that he could have ossible balanced ischemia on nuc. Cath showed severe CAD and underwent CABG by Dr. Cornelius Moras 11/04/15 with LIMA to distal LAD, SVG to 1st OM of LCx, seq VG to 2nd OM of LCX. He had normal LV systolic function. He is doing well today.  He denies any anginal CP, SOB, DOE, LE edema.  He has not had any dizziness, palpitations or syncope.   Past Medical History  Diagnosis Date  . History of kidney stones   . Hypertension     takes Norvasc and Micardis daily  . Hyperlipidemia     takes Crestor daily  . Pneumonia     history of double 30+yrs ago  . History of seasonal allergies     takes Loratadine daily  . Joint pain   . Chronic back pain     herniated disc  . GERD (gastroesophageal reflux disease)     takes Omeprazole daily  . Diverticulosis   . Nocturia   . Muscle cramps     takes KDUr and Mag Ox as a trial to see if it will help  . History of gout     takes Allopurinol daily  . Kidney stones   . CAD (coronary artery disease), native coronary artery 09/12/2015  . 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 CKD (chronic kidney disease), stage III     Past Surgical History  Procedure Laterality Date  . Cystoscopy  1993  . Foot surgery  19yrs ago    right-removed bone spur   . Colonoscopy    . Esophagogastroduodenoscopy       with diliation  . Lithotripsy      x 2  . Vasectomy  97yrs ago  . Lumbar laminectomy  03-07-2012    with fusion  . Cystoscopy with ureteroscopy  07/28/2012    Procedure: CYSTOSCOPY WITH URETEROSCOPY;  Surgeon: [name], MD;  Location: [facility];  Service: Urology;  Laterality: Left;     . Holmium laser application  07/28/2012    Procedure: HOLMIUM LASER APPLICATION;  Surgeon: [name], MD;  Location: [facility];  Service: Urology;  Laterality: Left;  left lithotripsy  . Cardiac catheterization N/A 10/28/2015    Procedure: Left Heart Cath and Coronary Angiography;  Surgeon: [name], MD;  Location: [facility] INVASIVE CV LAB;  Service: Cardiovascular;  Laterality: N/A;  . Back surgery    . Tonsillectomy    . Coronary artery bypass graft N/A 11/04/2015    Procedure: CORONARY ARTERY BYPASS GRAFTING (CABG) times three using left internal mammary artery and right saphenous vein harvested with endoscope.;  Surgeon: [name], MD;  Location: [facility] OR;  Service: Open Heart Surgery;  Laterality: N/A;  . Tee without cardioversion N/A 11/04/2015  Procedure: TRANSESOPHAGEAL ECHOCARDIOGRAM (TEE);  Surgeon: Purcell Nailslarence H Owen, MD;  [facility];  Service: Open Heart Surgery;  Laterality: N/A;    Current Medications: Outpatient Prescriptions Prior to Visit  Medication Sig Dispense Refill  . allopurinol (ZYLOPRIM) 100 MG tablet Take 100 mg by mouth 2 (two) times daily.     [name]. aspirin EC 325 MG tablet Take 1 tablet (325 mg total) by mouth daily.    . cholecalciferol (VITAMIN D) 1000 UNITS tablet Take 1,000 Units by mouth daily.    . Coenzyme Q10 (COQ10) 100 MG CAPS Take 1 capsule by mouth daily.     [name]. ezetimibe (ZETIA) 10 MG tablet Take 10 mg by mouth daily.    . fexofenadine (ALLEGRA) 180 MG tablet Take 180 mg by mouth daily as needed. (allergies)    . loratadine (CLARITIN) 10 MG tablet Take 10 mg by mouth daily.    . metoprolol tartrate (LOPRESSOR) 25 MG tablet Take 1 tablet (25 mg total) by mouth 2  (two) times daily. 180 tablet 3  . Multiple Vitamin (MULTIVITAMIN WITH MINERALS) TABS Take 1 tablet by mouth daily.    [name]. oxyCODONE (OXY IR/ROXICODONE) 5 MG immediate release tablet Take 1-2 tablets (5-10 mg total) by mouth every 4 (four) hours as needed for severe pain. 30 tablet 0  . pantoprazole (PROTONIX) 40 MG tablet Take 40 mg by mouth daily.    . pravastatin (PRAVACHOL) 20 MG tablet Take 1 tablet (20 mg total) by mouth every evening. 30 tablet 11  . Tamsulosin HCl (FLOMAX) 0.4 MG CAPS Take 0.4 mg by mouth daily after supper.    . telmisartan (MICARDIS) 20 MG tablet Take 1 tablet (20 mg total) by mouth every morning. 90 tablet 3  . finasteride (PROSCAR) 5 MG tablet Take 5 mg by mouth daily.  0   No facility-administered medications prior to visit.     Allergies:   Atorvastatin; Hctz; and Rosuvastatin   Social History   Social History  . Marital Status: Married    Spouse Name: N/A  . Number of Children: N/A  . Years of Education: N/A   Social History Main Topics  . Smoking status: Never Smoker   . Smokeless tobacco: Never Used  . Alcohol Use: No  . Drug Use: No  . Sexual Activity: Yes   Other Topics Concern  . None   Social History Narrative     Family History:  The patient's family history includes Dementia in his mother; Prostate cancer in his father.   ROS:   Please see the history of present illness.    ROS All other systems reviewed and are negative.   PHYSICAL EXAM:   VS:  BP 126/80 mmHg  Pulse 80  Ht 5\' 6"  (1.676 m)  Wt 208 lb (94.348 kg)  BMI 33.59 kg/m2   GEN: Well nourished, well developed, in no acute distress HEENT: normal Neck: no JVD, carotid bruits, or masses Cardiac: RRR; no murmurs, rubs, or gallops,no edema.  Intact distal pulses bilaterally.  Respiratory:  clear to auscultation bilaterally, normal work of breathing GI: soft, nontender, nondistended, + BS MS: no deformity or atrophy Skin: warm and dry, no rash Neuro:  Alert and Oriented x  3, Strength and sensation are intact Psych: euthymic mood, full affect  Wt Readings from Last 3 Encounters:  01/19/16 208 lb (94.348 kg)  12/05/15 207 lb (93.895 kg)  11/22/15 207 lb (93.895 kg)      Studies/Labs Reviewed:   EKG:  EKG  is not ordered today.    Recent Labs: 11/05/2015: Magnesium 2.5* 11/22/2015: Hemoglobin 11.2*; Platelets 320 12/13/2015: BUN 13; Creat 1.49*; Potassium 4.0; Sodium 141 12/19/2015: ALT 19   Lipid Panel    Component Value Date/Time   CHOL 138 12/19/2015 1005   TRIG 155* 12/19/2015 1005   HDL 41 12/19/2015 1005   CHOLHDL 3.4 12/19/2015 1005   VLDL 31* 12/19/2015 1005   LDLCALC 66 12/19/2015 1005    Additional studies/ records that were reviewed today include:  Hospital records    ASSESSMENT:    1. Coronary artery disease involving native coronary artery of native heart without angina pectoris   2. Essential (primary) hypertension   3. Mixed hyperlipidemia   4. Dyslipidemia      PLAN:  In order of problems listed above:  1. ASCAD multivessel s/p CABG and doing well.  He has not had any further anginal CP.  Continue ASA and decrease to 81mg  daily.  Continue statin and BB.  He never got set up with cardiac rehab so we will get him set up.   2. HTN - Bp well controlled on current medical regimen. Continue ARB/BB 3. Hyperlipidemia - LDL goal < 70.  Continue statin and Zetia.  LDL at goal at 66.    Medication Adjustments/Labs and Tests Ordered: Current medicines are reviewed at length with the patient today.  Concerns regarding medicines are outlined above.  Medication changes, Labs and Tests ordered today are listed in the Patient Instructions below.  There are no Patient Instructions on file for this visit.   Signed, Armanda Magicraci Turner, MD  01/19/2016 1:46 PM    [facility] [address], [place], [place]  [address] Phone: [phone]; Fax: [phone]

## 2016-01-19 NOTE — Patient Instructions (Signed)
Medication Instructions:  1) DECREASE ASPIRIN to 81 mg daily  Labwork: None  Testing/Procedures: None  Follow-Up: You have been referred to Cardiac Rehab.  Your physician wants you to follow-up in: 6 months with Dr. Mayford Knifeurner. You will receive a reminder letter in the mail two months in advance. If you don't receive a letter, please call our office to schedule the follow-up appointment.   Any Other Special Instructions Will Be Listed Below (If Applicable).     If you need a refill on your cardiac medications before your next appointment, please call your pharmacy.

## 2016-02-14 ENCOUNTER — Encounter (HOSPITAL_COMMUNITY)
Admission: RE | Admit: 2016-02-14 | Discharge: 2016-02-14 | Disposition: A | Discharge status: Home / Self Care | Payer: Medicare Other | Source: Ambulatory Visit | Attending: Cardiology | Admitting: Cardiology

## 2016-02-14 VITALS — Ht 66.0 in | Wt 210.3 lb

## 2016-02-14 DIAGNOSIS — Z951 Presence of aortocoronary bypass graft: Secondary | ICD-10-CM

## 2016-02-14 NOTE — Progress Notes (Signed)
Cardiac Rehab Medication Review by a Pharmacist  Does the patient  feel that his/her medications are working for him/her?  yes  Has the patient been experiencing any side effects to the medications prescribed?  no  Does the patient measure his/her own blood pressure or blood glucose at home?  no   Does the patient have any problems obtaining medications due to transportation or finances?   no  Understanding of regimen: good Understanding of indications: good Potential of compliance: good    Pharmacist comments: TV is a 40 yom who presents in good spirits. No adverse effects or problems with current medications noted. Does not routinely check blood pressure at home. Overall, very motivated.     [name] [name], Cleotis Nipper, PharmD Clinical Pharmacy Resident [phone] (Pager) 02/14/2016 2:41 PM

## 2016-02-17 NOTE — Progress Notes (Signed)
02/14/16 1330-1500 Patient oriented for the Phase 2 cardiac rehabilitation program. Introduction to the program, pharmacy review and measurements completed. Patient wearing open-toe sandals, therefore unable to complete 6-minute walk test. Will complete 6 MWT prior to first exercise session. Artist Pais, MS, ACSM CCEP

## 2016-02-20 ENCOUNTER — Encounter (HOSPITAL_COMMUNITY)
Admission: RE | Admit: 2016-02-20 | Discharge: 2016-02-20 | Disposition: A | Discharge status: Home / Self Care | Payer: Medicare Other | Source: Ambulatory Visit | Attending: Cardiology | Admitting: Cardiology

## 2016-02-20 ENCOUNTER — Encounter (HOSPITAL_COMMUNITY): Payer: Self-pay

## 2016-02-20 VITALS — BP 142/86 | HR 86 | Ht 66.0 in | Wt 210.5 lb

## 2016-02-20 DIAGNOSIS — Z951 Presence of aortocoronary bypass graft: Secondary | ICD-10-CM | POA: Diagnosis present

## 2016-02-20 NOTE — Progress Notes (Signed)
Cardiac Individual Treatment Plan  Patient Details  Name: [name] [name] MRN: [id number] Date of Birth: 05-23-1946 Referring Provider:   Flowsheet Row CARDIAC REHAB PHASE II EXERCISE [facility]2016 [facility]  Referring Provider  Armanda Magicurner, Traci MD      Initial Encounter Date:  Flowsheet Row CARDIAC REHAB PHASE II EXERCISE from 02/20/2016 [facility]  Date  02/20/16  Referring Provider  Armanda Magicurner, Traci MD      Visit Diagnosis: S/P CABG x 3  Patient's Home Medications on Admission:  Current Outpatient Prescriptions:  .  allopurinol (ZYLOPRIM) 100 MG tablet, Take 100 mg by mouth 2 (two) times daily. , Disp: , Rfl:  .  aspirin EC 81 MG EC tablet, Take 1 tablet (81 mg total) by mouth daily., Disp: , Rfl:  .  cholecalciferol (VITAMIN D) 1000 UNITS tablet, Take 1,000 Units by mouth daily., Disp: , Rfl:  .  Coenzyme Q10 (COQ10) 100 MG CAPS, Take 1 capsule by mouth daily. , Disp: , Rfl:  .  ezetimibe (ZETIA) 10 MG tablet, Take 10 mg by mouth daily., Disp: , Rfl:  .  fexofenadine (ALLEGRA) 180 MG tablet, Take 180 mg by mouth daily as needed. (allergies), Disp: , Rfl:  .  loratadine (CLARITIN) 10 MG tablet, Take 10 mg by mouth daily., Disp: , Rfl:  .  metoprolol tartrate (LOPRESSOR) 25 MG tablet, Take 1 tablet (25 mg total) by mouth 2 (two) times daily., Disp: 180 tablet, Rfl: 3 .  Multiple Vitamin (MULTIVITAMIN WITH MINERALS) TABS, Take 1 tablet by mouth daily., Disp: , Rfl:  .  pantoprazole (PROTONIX) 40 MG tablet, Take 40 mg by mouth daily., Disp: , Rfl:  .  pravastatin (PRAVACHOL) 20 MG tablet, Take 1 tablet (20 mg total) by mouth every evening., Disp: 30 tablet, Rfl: 11 .  Tamsulosin HCl (FLOMAX) 0.4 MG CAPS, Take 0.4 mg by mouth daily after supper., Disp: , Rfl:  .  telmisartan (MICARDIS) 20 MG tablet, Take 1 tablet (20 mg total) by mouth every morning., Disp: 90 tablet, Rfl: 3  Past Medical History: Past Medical History:   Diagnosis Date  . CAD (coronary artery disease), native coronary artery 09/12/2015  . Chronic back pain    herniated disc  . CKD (chronic kidney disease), stage III   . Diverticulosis   . GERD (gastroesophageal reflux disease)    takes Omeprazole daily  . History of gout    takes Allopurinol daily  . History of kidney stones   . History of seasonal allergies    takes Loratadine daily  . Hyperlipidemia    takes Crestor daily  . Hypertension    takes Norvasc and Micardis daily  . Joint pain   . Kidney stones   . Muscle cramps    takes KDUr and Mag Ox as a trial to see if it will help  . Nocturia   . Pneumonia    history of double 30+yrs ago  . 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 Never Smoker  Smokeless Tobacco  . Never Used    Labs: Recent Review Flowsheet Data    Labs for ITP Cardiac and Pulmonary Rehab Latest Ref Rng & Units 11/04/2015 11/04/2015 11/04/2015 11/05/2015 12/19/2015   Cholestrol 125 - 200 mg/dL - - - - 409138   LDLCALC <811<130 mg/dL - - - -  66   HDL >=40 mg/dL - - - - 41   Trlycerides <150 mg/dL - - - - 295(A)   Hemoglobin A1c 4.8 - 5.6 % - - - - -   PHART 7.350 - 7.450 7.267(L) - 7.372 - -   PCO2ART 35.0 - 45.0 mmHg 44.2 - 44.1 - -   HCO3 20.0 - 24.0 mEq/L 20.3 - 25.8(H) - -   TCO2 0 - 100 mmol/L 22 24 27 24  -   ACIDBASEDEF 0.0 - 2.0 mmol/L 6.0(H) - - - -   O2SAT % 91.0 - 94.0 - -      Capillary Blood Glucose: Lab Results  Component Value Date   GLUCAP 97 11/08/2015   GLUCAP 96 11/08/2015   GLUCAP 99 11/07/2015   GLUCAP 109 (H) 11/07/2015   GLUCAP 115 (H) 11/07/2015     Exercise Target Goals: Date: 02/20/16  Exercise Program Goal: Individual exercise prescription set with THRR, safety & activity barriers. Participant demonstrates ability to understand and report RPE using BORG scale, to self-measure pulse accurately, and to acknowledge the importance of  the exercise prescription.  Exercise Prescription Goal: Starting with aerobic activity 30 plus minutes a day, 3 days per week for initial exercise prescription. Provide home exercise prescription and guidelines that participant acknowledges understanding prior to discharge.  Activity Barriers & Risk Stratification:     Activity Barriers & Cardiac Risk Stratification - 02/14/16 1403      Activity Barriers & Cardiac Risk Stratification   Activity Barriers Back Problems   Cardiac Risk Stratification High      6 Minute Walk:     6 Minute Walk    Row Name 02/20/16 1623         6 Minute Walk   Phase Initial     Distance 1800 feet     Walk Time 6 minutes     # of Rest Breaks 0     MPH 3.41     METS 3.86     RPE 12     VO2 Peak 13.51     Symptoms No     Resting HR 86 bpm     Resting BP 142/86     Max Ex. HR 130 bpm     Max Ex. BP 144/84     2 Minute Post BP 124/84        Initial Exercise Prescription:     Initial Exercise Prescription - 02/20/16 1600      Date of Initial Exercise RX and Referring Provider   Date 02/20/16   Referring Provider Armanda Magic MD     Treadmill   MPH 2.8   Grade 1   Minutes 10   METs 3.53     Bike   Level 1.4   Minutes 10   METs 3.78     NuStep   Level 3   Minutes 10   METs 2.8     Prescription Details   Frequency (times per week) 3   Duration Progress to 30 minutes of continuous aerobic without signs/symptoms of physical distress     Intensity   THRR 40-80% of Max Heartrate 60-121   Ratings of Perceived Exertion 11-13   Perceived Dyspnea 0-4     Progression   Progression Continue to progress workloads to maintain intensity without signs/symptoms of physical distress.     Resistance Training   Training Prescription Yes   Weight 2lbs   Reps 10-12      Perform Capillary Blood Glucose  checks as needed.  Exercise Prescription Changes:   Exercise Comments:   Discharge Exercise Prescription (Final Exercise  Prescription Changes):   Nutrition:  Target Goals: Understanding of nutrition guidelines, daily intake of sodium 1500mg , cholesterol 200mg , calories 30% from fat and 7% or less from saturated fats, daily to have 5 or more servings of fruits and vegetables.  Biometrics:     Pre Biometrics - 02/14/16 1500      Pre Biometrics   Height 5\' 6"  (1.676 m)   Weight 210 lb 5.1 oz (95.4 kg)   Waist Circumference 41.75 inches   Hip Circumference 43.75 inches   Waist to Hip Ratio 0.95 %   BMI (Calculated) 34   Triceps Skinfold 15 mm   % Body Fat 31 %   Grip Strength 52 kg   Flexibility 14.5 in   Single Leg Stand 6.4 seconds       Nutrition Therapy Plan and Nutrition Goals:     Nutrition Therapy & Goals - 02/15/16 1505      Nutrition Therapy   Diet Therapeutic Lifestyle Changes     Personal Nutrition Goals   Personal Goal #1 1-2 lb wt loss per week to a wt loss goal of 6-24 lb   Personal Goal #2 Pt to have a basic understanding of a diabetic diet     Intervention Plan   Intervention Prescribe, educate and counsel regarding individualized specific dietary modifications aiming towards targeted core components such as weight, hypertension, lipid management, diabetes, heart failure and other comorbidities.   Expected Outcomes Short Term Goal: Understand basic principles of dietary content, such as calories, fat, sodium, cholesterol and nutrients.;Long Term Goal: Adherence to prescribed nutrition plan.      Nutrition Discharge: Nutrition Scores:     Nutrition Assessments - 02/15/16 1503      MEDFICTS Scores   Pre Score 53  will verify score with pt      Nutrition Goals Re-Evaluation:   Psychosocial: Target Goals: Acknowledge presence or absence of depression, maximize coping skills, provide positive support system. Participant is able to verbalize types and ability to use techniques and skills needed for reducing stress and depression.  Initial Review & Psychosocial  Screening:     Initial Psych Review & Screening - 02/20/16 1708      Family Dynamics   Good Support System? Yes   Comments --  [name] [name]'s wife has brain cancer in 2016., her cancer is in remission.     Barriers   Psychosocial barriers to participate in program There are no identifiable barriers or psychosocial needs.;The patient should benefit from training in stress management and relaxation.     Screening Interventions   Interventions Encouraged to exercise      Quality of Life Scores:     Quality of Life - 02/20/16 1709      Quality of Life Scores   GLOBAL Post --  Reviewed with the patient      PHQ-9: Recent Review Flowsheet Data    Depression screen PHQ 2/9 02/20/2016   Decreased Interest 0   Down, Depressed, Hopeless 0   PHQ - 2 Score 0      Psychosocial Evaluation and Intervention:   Psychosocial Re-Evaluation:   Vocational Rehabilitation: Provide vocational rehab assistance to qualifying candidates.   Vocational Rehab Evaluation & Intervention:     Vocational Rehab - 02/20/16 1707      Initial Vocational Rehab Evaluation & Intervention   Assessment shows need for Vocational Rehabilitation No  Patient  is retired      Education: Education Goals: Education classes will be provided on a weekly basis, covering required topics. Participant will state understanding/return demonstration of topics presented.  Learning Barriers/Preferences:     Learning Barriers/Preferences - 02/14/16 1427      Learning Barriers/Preferences   Learning Barriers None   Learning Preferences Written Material      Education Topics: Count Your Pulse:  -Group instruction provided by verbal instruction, demonstration, patient participation and written materials to support subject.  Instructors address importance of being able to find your pulse and how to count your pulse when at home without a heart monitor.  Patients get hands on experience counting their pulse with  staff help and individually.   Heart Attack, Angina, and Risk Factor Modification:  -Group instruction provided by verbal instruction, video, and written materials to support subject.  Instructors address signs and symptoms of angina and heart attacks.    Also discuss risk factors for heart disease and how to make changes to improve heart health risk factors.   Functional Fitness:  -Group instruction provided by verbal instruction, demonstration, patient participation, and written materials to support subject.  Instructors address safety measures for doing things around the house.  Discuss how to get up and down off the floor, how to pick things up properly, how to safely get out of a chair without assistance, and balance training.   Meditation and Mindfulness:  -Group instruction provided by verbal instruction, patient participation, and written materials to support subject.  Instructor addresses importance of mindfulness and meditation practice to help reduce stress and improve awareness.  Instructor also leads participants through a meditation exercise.    Stretching for Flexibility and Mobility:  -Group instruction provided by verbal instruction, patient participation, and written materials to support subject.  Instructors lead participants through series of stretches that are designed to increase flexibility thus improving mobility.  These stretches are additional exercise for major muscle groups that are typically performed during regular warm up and cool down.   Hands Only CPR Anytime:  -Group instruction provided by verbal instruction, video, patient participation and written materials to support subject.  Instructors co-teach with AHA video for hands only CPR.  Participants get hands on experience with mannequins.   Nutrition I class: Heart Healthy Eating:  -Group instruction provided by PowerPoint slides, verbal discussion, and written materials to support subject matter. The  instructor gives an explanation and review of the Therapeutic Lifestyle Changes diet recommendations, which includes a discussion on lipid goals, dietary fat, sodium, fiber, plant stanol/sterol esters, sugar, and the components of a well-balanced, healthy diet.   Nutrition II class: Lifestyle Skills:  -Group instruction provided by PowerPoint slides, verbal discussion, and written materials to support subject matter. The instructor gives an explanation and review of label reading, grocery shopping for heart health, heart healthy recipe modifications, and ways to make healthier choices when eating out.   Diabetes Question & Answer:  -Group instruction provided by PowerPoint slides, verbal discussion, and written materials to support subject matter. The instructor gives an explanation and review of diabetes co-morbidities, pre- and post-prandial blood glucose goals, pre-exercise blood glucose goals, signs, symptoms, and treatment of hypoglycemia and hyperglycemia, and foot care basics.   Diabetes Blitz:  -Group instruction provided by PowerPoint slides, verbal discussion, and written materials to support subject matter. The instructor gives an explanation and review of the physiology behind type 1 and type 2 diabetes, diabetes medications and rational behind using different medications, pre-  and post-prandial blood glucose recommendations and Hemoglobin A1c goals, diabetes diet, and exercise including blood glucose guidelines for exercising safely.    Portion Distortion:  -Group instruction provided by PowerPoint slides, verbal discussion, written materials, and food models to support subject matter. The instructor gives an explanation of serving size versus portion size, changes in portions sizes over the last 20 years, and what consists of a serving from each food group.   Stress Management:  -Group instruction provided by verbal instruction, video, and written materials to support subject  matter.  Instructors review role of stress in heart disease and how to cope with stress positively.     Exercising on Your Own:  -Group instruction provided by verbal instruction, power point, and written materials to support subject.  Instructors discuss benefits of exercise, components of exercise, frequency and intensity of exercise, and end points for exercise.  Also discuss use of nitroglycerin and activating EMS.  Review options of places to exercise outside of rehab.  Review guidelines for sex with heart disease.   Cardiac Drugs I:  -Group instruction provided by verbal instruction and written materials to support subject.  Instructor reviews cardiac drug classes: antiplatelets, anticoagulants, beta blockers, and statins.  Instructor discusses reasons, side effects, and lifestyle considerations for each drug class.   Cardiac Drugs II:  -Group instruction provided by verbal instruction and written materials to support subject.  Instructor reviews cardiac drug classes: angiotensin converting enzyme inhibitors (ACE-I), angiotensin II receptor blockers (ARBs), nitrates, and calcium channel blockers.  Instructor discusses reasons, side effects, and lifestyle considerations for each drug class.   Anatomy and Physiology of the Circulatory System:  -Group instruction provided by verbal instruction, video, and written materials to support subject.  Reviews functional anatomy of heart, how it relates to various diagnoses, and what role the heart plays in the overall system.   Knowledge Questionnaire Score:     Knowledge Questionnaire Score - 02/14/16 1426      Knowledge Questionnaire Score   Pre Score 24/24      Core Components/Risk Factors/Patient Goals at Admission:     Personal Goals and Risk Factors at Admission - 02/14/16 1504      Core Components/Risk Factors/Patient Goals on Admission    Weight Management Weight Loss;Obesity   Hypertension Yes   Intervention Provide education  on lifestyle modifcations including regular physical activity/exercise, weight management, moderate sodium restriction and increased consumption of fresh fruit, vegetables, and low fat dairy, alcohol moderation, and smoking cessation.;Monitor prescription use compliance.   Expected Outcomes Short Term: Continued assessment and intervention until BP is < 140/49mm HG in hypertensive participants. < 130/52mm HG in hypertensive participants with diabetes, heart failure or chronic kidney disease.;Long Term: Maintenance of blood pressure at goal levels.   Lipids Yes   Intervention Provide education and support for participant on nutrition & aerobic/resistive exercise along with prescribed medications to achieve LDL 70mg , HDL >40mg .   Expected Outcomes Short Term: Participant states understanding of desired cholesterol values and is compliant with medications prescribed. Participant is following exercise prescription and nutrition guidelines.;Long Term: Cholesterol controlled with medications as prescribed, with individualized exercise RX and with personalized nutrition plan. Value goals: LDL < , HDL > 40 mg.      Core Components/Risk Factors/Patient Goals Review:    Core Components/Risk Factors/Patient Goals at Discharge (Final Review):    ITP Comments:     ITP Comments    Row Name 02/14/16 1350  ITP Comments Medical Director- Dr. Armanda Magic          Comments: Patient attended orientation from 1445 to 1530 to review rules and guidelines for program. Completed 6 minute walk test, Intitial ITP, and exercise prescription.  VSS. Telemetry-Sinus Rhythm, t wave inversion and a bundle branch block, this has been previously documented.  Asymptomatic. [name] [name] plans to switch to the 0645 class starting on Friday. [name] [name] cares for his grandson on Wednesday's and has to drive to [place] to take care of his 87 year old grandson.[name] Lighter, RN,BSN 02/20/2016 5:13 PM

## 2016-02-22 ENCOUNTER — Encounter (HOSPITAL_COMMUNITY): Payer: Medicare Other

## 2016-02-24 ENCOUNTER — Encounter (HOSPITAL_COMMUNITY)
Admission: RE | Admit: 2016-02-24 | Discharge: 2016-02-24 | Disposition: A | Discharge status: Home / Self Care | Payer: Medicare Other | Source: Ambulatory Visit | Attending: Cardiology | Admitting: Cardiology

## 2016-02-24 ENCOUNTER — Encounter (HOSPITAL_COMMUNITY): Payer: Medicare Other

## 2016-02-24 DIAGNOSIS — Z951 Presence of aortocoronary bypass graft: Secondary | ICD-10-CM

## 2016-02-27 ENCOUNTER — Encounter (HOSPITAL_COMMUNITY)
Admission: RE | Admit: 2016-02-27 | Discharge: 2016-02-27 | Disposition: A | Discharge status: Home / Self Care | Payer: Medicare Other | Source: Ambulatory Visit | Attending: Cardiology | Admitting: Cardiology

## 2016-02-27 ENCOUNTER — Encounter (HOSPITAL_COMMUNITY): Payer: Medicare Other

## 2016-02-27 DIAGNOSIS — Z951 Presence of aortocoronary bypass graft: Secondary | ICD-10-CM

## 2016-02-29 ENCOUNTER — Encounter (HOSPITAL_COMMUNITY): Payer: Medicare Other

## 2016-02-29 ENCOUNTER — Encounter (HOSPITAL_COMMUNITY): Admission: RE | Admit: 2016-02-29 | Payer: Medicare Other | Source: Ambulatory Visit

## 2016-03-01 NOTE — Progress Notes (Signed)
Cardiac Individual Treatment Plan  Patient Details  Name: [name] [name] MRN: [id number] Date of Birth: 05-23-1946 Referring Provider:   Flowsheet Row CARDIAC REHAB PHASE II EXERCISE [facility]  Referring Provider  Armanda Magicurner, Traci MD      Initial Encounter Date:  Flowsheet Row CARDIAC REHAB PHASE II EXERCISE from 02/20/2016 [facility]  Date  02/20/16  Referring Provider  Armanda Magicurner, Traci MD      Visit Diagnosis: S/P CABG x 3  Patient's Home Medications on Admission:  Current Outpatient Prescriptions:  .  allopurinol (ZYLOPRIM) 100 MG tablet, Take 100 mg by mouth 2 (two) times daily. , Disp: , Rfl:  .  aspirin EC 81 MG EC tablet, Take 1 tablet (81 mg total) by mouth daily., Disp: , Rfl:  .  cholecalciferol (VITAMIN D) 1000 UNITS tablet, Take 1,000 Units by mouth daily., Disp: , Rfl:  .  Coenzyme Q10 (COQ10) 100 MG CAPS, Take 1 capsule by mouth daily. , Disp: , Rfl:  .  ezetimibe (ZETIA) 10 MG tablet, Take 10 mg by mouth daily., Disp: , Rfl:  .  fexofenadine (ALLEGRA) 180 MG tablet, Take 180 mg by mouth daily as needed. (allergies), Disp: , Rfl:  .  loratadine (CLARITIN) 10 MG tablet, Take 10 mg by mouth daily., Disp: , Rfl:  .  metoprolol tartrate (LOPRESSOR) 25 MG tablet, Take 1 tablet (25 mg total) by mouth 2 (two) times daily., Disp: 180 tablet, Rfl: 3 .  Multiple Vitamin (MULTIVITAMIN WITH MINERALS) TABS, Take 1 tablet by mouth daily., Disp: , Rfl:  .  pantoprazole (PROTONIX) 40 MG tablet, Take 40 mg by mouth daily., Disp: , Rfl:  .  pravastatin (PRAVACHOL) 20 MG tablet, Take 1 tablet (20 mg total) by mouth every evening., Disp: 30 tablet, Rfl: 11 .  Tamsulosin HCl (FLOMAX) 0.4 MG CAPS, Take 0.4 mg by mouth daily after supper., Disp: , Rfl:  .  telmisartan (MICARDIS) 20 MG tablet, Take 1 tablet (20 mg total) by mouth every morning., Disp: 90 tablet, Rfl: 3  Past Medical History: Past Medical History:   Diagnosis Date  . CAD (coronary artery disease), native coronary artery 09/12/2015  . Chronic back pain    herniated disc  . CKD (chronic kidney disease), stage III   . Diverticulosis   . GERD (gastroesophageal reflux disease)    takes Omeprazole daily  . History of gout    takes Allopurinol daily  . History of kidney stones   . History of seasonal allergies    takes Loratadine daily  . Hyperlipidemia    takes Crestor daily  . Hypertension    takes Norvasc and Micardis daily  . Joint pain   . Kidney stones   . Muscle cramps    takes KDUr and Mag Ox as a trial to see if it will help  . Nocturia   . Pneumonia    history of double 30+yrs ago  . 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 Never Smoker  Smokeless Tobacco  . Never Used    Labs: Recent Review Flowsheet Data    Labs for ITP Cardiac and Pulmonary Rehab Latest Ref Rng & Units 11/04/2015 11/04/2015 11/04/2015 11/05/2015 12/19/2015   Cholestrol 125 - 200 mg/dL - - - - 409138   LDLCALC <811<130 mg/dL - - - -  66   HDL >=40 mg/dL - - - - 41   Trlycerides <150 mg/dL - - - - 960(A155(H)   Hemoglobin A1c 4.8 - 5.6 % - - - - -   PHART 7.350 - 7.450 7.267(L) - 7.372 - -   PCO2ART 35.0 - 45.0 mmHg 44.2 - 44.1 - -   HCO3 20.0 - 24.0 mEq/L 20.3 - 25.8(H) - -   TCO2 0 - 100 mmol/L 22 24 27 24  -   ACIDBASEDEF 0.0 - 2.0 mmol/L 6.0(H) - - - -   O2SAT % 91.0 - 94.0 - -      Capillary Blood Glucose: Lab Results  Component Value Date   GLUCAP 97 11/08/2015   GLUCAP 96 11/08/2015   GLUCAP 99 11/07/2015   GLUCAP 109 (H) 11/07/2015   GLUCAP 115 (H) 11/07/2015     Exercise Target Goals:    Exercise Program Goal: Individual exercise prescription set with THRR, safety & activity barriers. Participant demonstrates ability to understand and report RPE using BORG scale, to self-measure pulse accurately, and to acknowledge the importance of the exercise  prescription.  Exercise Prescription Goal: Starting with aerobic activity 30 plus minutes a day, 3 days per week for initial exercise prescription. Provide home exercise prescription and guidelines that participant acknowledges understanding prior to discharge.  Activity Barriers & Risk Stratification:     Activity Barriers & Cardiac Risk Stratification - 02/14/16 1403      Activity Barriers & Cardiac Risk Stratification   Activity Barriers Back Problems   Cardiac Risk Stratification High      6 Minute Walk:     6 Minute Walk    Row Name 02/20/16 1623         6 Minute Walk   Phase Initial     Distance 1800 feet     Walk Time 6 minutes     # of Rest Breaks 0     MPH 3.41     METS 3.86     RPE 12     VO2 Peak 13.51     Symptoms No     Resting HR 86 bpm     Resting BP 142/86     Max Ex. HR 130 bpm     Max Ex. BP 144/84     2 Minute Post BP 124/84        Initial Exercise Prescription:     Initial Exercise Prescription - 02/20/16 1600      Date of Initial Exercise RX and Referring Provider   Date 02/20/16   Referring Provider Armanda Magicurner, Traci MD     Treadmill   MPH 2.8   Grade 1   Minutes 10   METs 3.53     Bike   Level 1.4   Minutes 10   METs 3.78     NuStep   Level 3   Minutes 10   METs 2.8     Prescription Details   Frequency (times per week) 3   Duration Progress to 30 minutes of continuous aerobic without signs/symptoms of physical distress     Intensity   THRR 40-80% of Max Heartrate 60-121   Ratings of Perceived Exertion 11-13   Perceived Dyspnea 0-4     Progression   Progression Continue to progress workloads to maintain intensity without signs/symptoms of physical distress.     Resistance Training   Training Prescription Yes   Weight 2lbs   Reps 10-12      Perform Capillary Blood Glucose  checks as needed.  Exercise Prescription Changes:     Exercise Prescription Changes    Row Name 02/24/16 1000             Treadmill    MPH 2.5       Grade 1       Minutes 10       METs 3.26          Exercise Comments:     Exercise Comments    Row Name 02/24/16 1037           Exercise Comments There are no changes to current Ex. Rx other than a decrease in TM speed. 2.5/1          Discharge Exercise Prescription (Final Exercise Prescription Changes):     Exercise Prescription Changes - 02/24/16 1000      Treadmill   MPH 2.5   Grade 1   Minutes 10   METs 3.26      Nutrition:  Target Goals: Understanding of nutrition guidelines, daily intake of sodium 1500mg , cholesterol 200mg , calories 30% from fat and 7% or less from saturated fats, daily to have 5 or more servings of fruits and vegetables.  Biometrics:     Pre Biometrics - 02/14/16 1500      Pre Biometrics   Height 5\' 6"  (1.676 m)   Weight 210 lb 5.1 oz (95.4 kg)   Waist Circumference 41.75 inches   Hip Circumference 43.75 inches   Waist to Hip Ratio 0.95 %   BMI (Calculated) 34   Triceps Skinfold 15 mm   % Body Fat 31 %   Grip Strength 52 kg   Flexibility 14.5 in   Single Leg Stand 6.4 seconds       Nutrition Therapy Plan and Nutrition Goals:     Nutrition Therapy & Goals - 02/15/16 1505      Nutrition Therapy   Diet Therapeutic Lifestyle Changes     Personal Nutrition Goals   Personal Goal #1 1-2 lb wt loss per week to a wt loss goal of 6-24 lb   Personal Goal #2 Pt to have a basic understanding of a diabetic diet     Intervention Plan   Intervention Prescribe, educate and counsel regarding individualized specific dietary modifications aiming towards targeted core components such as weight, hypertension, lipid management, diabetes, heart failure and other comorbidities.   Expected Outcomes Short Term Goal: Understand basic principles of dietary content, such as calories, fat, sodium, cholesterol and nutrients.;Long Term Goal: Adherence to prescribed nutrition plan.      Nutrition Discharge: Nutrition Scores:      Nutrition Assessments - 02/15/16 1503      MEDFICTS Scores   Pre Score 53  will verify score with pt      Nutrition Goals Re-Evaluation:   Psychosocial: Target Goals: Acknowledge presence or absence of depression, maximize coping skills, provide positive support system. Participant is able to verbalize types and ability to use techniques and skills needed for reducing stress and depression.  Initial Review & Psychosocial Screening:     Initial Psych Review & Screening - 02/20/16 1708      Family Dynamics   Good Support System? Yes   Comments --  Mr Petillo's wife has brain cancer in 2016., her cancer is in remission.     Barriers   Psychosocial barriers to participate in program There are no identifiable barriers or psychosocial needs.;The patient should benefit from training in stress management and relaxation.  Screening Interventions   Interventions Encouraged to exercise      Quality of Life Scores:     Quality of Life - 02/20/16 1709      Quality of Life Scores   GLOBAL Post --  Reviewed with the patient      PHQ-9: Recent Review Flowsheet Data    Depression screen PHQ 2/9 02/20/2016   Decreased Interest 0   Down, Depressed, Hopeless 0   PHQ - 2 Score 0      Psychosocial Evaluation and Intervention:   Psychosocial Re-Evaluation:   Vocational Rehabilitation: Provide vocational rehab assistance to qualifying candidates.   Vocational Rehab Evaluation & Intervention:     Vocational Rehab - 02/20/16 1707      Initial Vocational Rehab Evaluation & Intervention   Assessment shows need for Vocational Rehabilitation No  Patient is retired      Education: Education Goals: Education classes will be provided on a weekly basis, covering required topics. Participant will state understanding/return demonstration of topics presented.  Learning Barriers/Preferences:     Learning Barriers/Preferences - 02/14/16 1427      Learning Barriers/Preferences    Learning Barriers None   Learning Preferences Written Material      Education Topics: Count Your Pulse:  -Group instruction provided by verbal instruction, demonstration, patient participation and written materials to support subject.  Instructors address importance of being able to find your pulse and how to count your pulse when at home without a heart monitor.  Patients get hands on experience counting their pulse with staff help and individually.   Heart Attack, Angina, and Risk Factor Modification:  -Group instruction provided by verbal instruction, video, and written materials to support subject.  Instructors address signs and symptoms of angina and heart attacks.    Also discuss risk factors for heart disease and how to make changes to improve heart health risk factors.   Functional Fitness:  -Group instruction provided by verbal instruction, demonstration, patient participation, and written materials to support subject.  Instructors address safety measures for doing things around the house.  Discuss how to get up and down off the floor, how to pick things up properly, how to safely get out of a chair without assistance, and balance training.   Meditation and Mindfulness:  -Group instruction provided by verbal instruction, patient participation, and written materials to support subject.  Instructor addresses importance of mindfulness and meditation practice to help reduce stress and improve awareness.  Instructor also leads participants through a meditation exercise.    Stretching for Flexibility and Mobility:  -Group instruction provided by verbal instruction, patient participation, and written materials to support subject.  Instructors lead participants through series of stretches that are designed to increase flexibility thus improving mobility.  These stretches are additional exercise for major muscle groups that are typically performed during regular warm up and cool  down.   Hands Only CPR Anytime:  -Group instruction provided by verbal instruction, video, patient participation and written materials to support subject.  Instructors co-teach with AHA video for hands only CPR.  Participants get hands on experience with mannequins.   Nutrition I class: Heart Healthy Eating:  -Group instruction provided by PowerPoint slides, verbal discussion, and written materials to support subject matter. The instructor gives an explanation and review of the Therapeutic Lifestyle Changes diet recommendations, which includes a discussion on lipid goals, dietary fat, sodium, fiber, plant stanol/sterol esters, sugar, and the components of a well-balanced, healthy diet.   Nutrition II class: Lifestyle Skills:  -  Group instruction provided by PowerPoint slides, verbal discussion, and written materials to support subject matter. The instructor gives an explanation and review of label reading, grocery shopping for heart health, heart healthy recipe modifications, and ways to make healthier choices when eating out.   Diabetes Question & Answer:  -Group instruction provided by PowerPoint slides, verbal discussion, and written materials to support subject matter. The instructor gives an explanation and review of diabetes co-morbidities, pre- and post-prandial blood glucose goals, pre-exercise blood glucose goals, signs, symptoms, and treatment of hypoglycemia and hyperglycemia, and foot care basics.   Diabetes Blitz:  -Group instruction provided by PowerPoint slides, verbal discussion, and written materials to support subject matter. The instructor gives an explanation and review of the physiology behind type 1 and type 2 diabetes, diabetes medications and rational behind using different medications, pre- and post-prandial blood glucose recommendations and Hemoglobin A1c goals, diabetes diet, and exercise including blood glucose guidelines for exercising safely.    Portion Distortion:   -Group instruction provided by PowerPoint slides, verbal discussion, written materials, and food models to support subject matter. The instructor gives an explanation of serving size versus portion size, changes in portions sizes over the last 20 years, and what consists of a serving from each food group.   Stress Management:  -Group instruction provided by verbal instruction, video, and written materials to support subject matter.  Instructors review role of stress in heart disease and how to cope with stress positively.     Exercising on Your Own:  -Group instruction provided by verbal instruction, power point, and written materials to support subject.  Instructors discuss benefits of exercise, components of exercise, frequency and intensity of exercise, and end points for exercise.  Also discuss use of nitroglycerin and activating EMS.  Review options of places to exercise outside of rehab.  Review guidelines for sex with heart disease.   Cardiac Drugs I:  -Group instruction provided by verbal instruction and written materials to support subject.  Instructor reviews cardiac drug classes: antiplatelets, anticoagulants, beta blockers, and statins.  Instructor discusses reasons, side effects, and lifestyle considerations for each drug class.   Cardiac Drugs II:  -Group instruction provided by verbal instruction and written materials to support subject.  Instructor reviews cardiac drug classes: angiotensin converting enzyme inhibitors (ACE-I), angiotensin II receptor blockers (ARBs), nitrates, and calcium channel blockers.  Instructor discusses reasons, side effects, and lifestyle considerations for each drug class.   Anatomy and Physiology of the Circulatory System:  -Group instruction provided by verbal instruction, video, and written materials to support subject.  Reviews functional anatomy of heart, how it relates to various diagnoses, and what role the heart plays in the overall  system.   Knowledge Questionnaire Score:     Knowledge Questionnaire Score - 02/14/16 1426      Knowledge Questionnaire Score   Pre Score 24/24      Core Components/Risk Factors/Patient Goals at Admission:     Personal Goals and Risk Factors at Admission - 02/14/16 1504      Core Components/Risk Factors/Patient Goals on Admission    Weight Management Weight Loss;Obesity   Hypertension Yes   Intervention Provide education on lifestyle modifcations including regular physical activity/exercise, weight management, moderate sodium restriction and increased consumption of fresh fruit, vegetables, and low fat dairy, alcohol moderation, and smoking cessation.;Monitor prescription use compliance.   Expected Outcomes Short Term: Continued assessment and intervention until BP is < 140/5290mm HG in hypertensive participants. < 130/1580mm HG in hypertensive participants with diabetes,  heart failure or chronic kidney disease.;Long Term: Maintenance of blood pressure at goal levels.   Lipids Yes   Intervention Provide education and support for participant on nutrition & aerobic/resistive exercise along with prescribed medications to achieve LDL 70mg , HDL >40mg .   Expected Outcomes Short Term: Participant states understanding of desired cholesterol values and is compliant with medications prescribed. Participant is following exercise prescription and nutrition guidelines.;Long Term: Cholesterol controlled with medications as prescribed, with individualized exercise RX and with personalized nutrition plan. Value goals: LDL < 70mg , HDL > 40 mg.      Core Components/Risk Factors/Patient Goals Review:    Core Components/Risk Factors/Patient Goals at Discharge (Final Review):    ITP Comments:     ITP Comments    Row Name 02/14/16 1350           ITP Comments Medical Director- Dr. Armanda Magic          Comments:  Pt is making expected progress toward personal goals after completing 2 sessions.  Pt will be absent until 8/28 to care for his grandson until he goes to school.Recommend continued exercise and life style modification education including  stress management and relaxation techniques to decrease cardiac risk profile.  Psychosocial Assessment remains unchanged from previous assessment, pt has supportive family with no psychosocial needs, no further intervention warranted at this time. Karlene Lineman RN

## 2016-03-02 ENCOUNTER — Encounter (HOSPITAL_COMMUNITY): Payer: Medicare Other

## 2016-03-02 ENCOUNTER — Encounter (HOSPITAL_COMMUNITY)
Admission: RE | Admit: 2016-03-02 | Discharge: 2016-03-02 | Disposition: A | Discharge status: Home / Self Care | Payer: Medicare Other | Source: Ambulatory Visit | Attending: Cardiology | Admitting: Cardiology

## 2016-03-02 DIAGNOSIS — Z951 Presence of aortocoronary bypass graft: Secondary | ICD-10-CM

## 2016-03-05 ENCOUNTER — Encounter (HOSPITAL_COMMUNITY): Payer: Medicare Other

## 2016-03-07 ENCOUNTER — Encounter (HOSPITAL_COMMUNITY): Payer: Medicare Other

## 2016-03-09 ENCOUNTER — Encounter (HOSPITAL_COMMUNITY)
Admission: RE | Admit: 2016-03-09 | Discharge: 2016-03-09 | Disposition: A | Discharge status: Home / Self Care | Payer: Medicare Other | Source: Ambulatory Visit | Attending: Cardiology | Admitting: Cardiology

## 2016-03-09 ENCOUNTER — Encounter (HOSPITAL_COMMUNITY): Payer: Medicare Other

## 2016-03-09 DIAGNOSIS — Z951 Presence of aortocoronary bypass graft: Secondary | ICD-10-CM

## 2016-03-12 ENCOUNTER — Encounter (HOSPITAL_COMMUNITY): Payer: Medicare Other

## 2016-03-12 ENCOUNTER — Encounter (HOSPITAL_COMMUNITY)
Admission: RE | Admit: 2016-03-12 | Discharge: 2016-03-12 | Disposition: A | Discharge status: Home / Self Care | Payer: Medicare Other | Source: Ambulatory Visit | Attending: Cardiology | Admitting: Cardiology

## 2016-03-12 DIAGNOSIS — Z951 Presence of aortocoronary bypass graft: Secondary | ICD-10-CM

## 2016-03-14 ENCOUNTER — Encounter (HOSPITAL_COMMUNITY): Payer: Medicare Other

## 2016-03-14 ENCOUNTER — Encounter (HOSPITAL_COMMUNITY)
Admission: RE | Admit: 2016-03-14 | Discharge: 2016-03-14 | Disposition: A | Discharge status: Home / Self Care | Payer: Medicare Other | Source: Ambulatory Visit | Attending: Cardiology | Admitting: Cardiology

## 2016-03-14 DIAGNOSIS — Z951 Presence of aortocoronary bypass graft: Secondary | ICD-10-CM

## 2016-03-16 ENCOUNTER — Encounter (HOSPITAL_COMMUNITY): Payer: Medicare Other

## 2016-03-16 ENCOUNTER — Encounter (HOSPITAL_COMMUNITY)
Admission: RE | Admit: 2016-03-16 | Discharge: 2016-03-16 | Disposition: A | Discharge status: Home / Self Care | Payer: Medicare Other | Source: Ambulatory Visit | Attending: Cardiology | Admitting: Cardiology

## 2016-03-16 VITALS — Wt 211.6 lb

## 2016-03-16 DIAGNOSIS — Z951 Presence of aortocoronary bypass graft: Secondary | ICD-10-CM | POA: Insufficient documentation

## 2016-03-16 NOTE — Progress Notes (Signed)
Reviewed home exercise with pt today.  Pt plans to walk 2-3x/week and use light handheld weights 2x/week for exercise.  Reviewed THR, pulse, RPE, sign and symptoms, and when to call 911 or MD.  Also discussed weather considerations and indoor options.  Pt voiced understanding.   Ellicia Alix [employer],MS,ACSM RCEP

## 2016-03-19 ENCOUNTER — Encounter (HOSPITAL_COMMUNITY): Payer: Medicare Other

## 2016-03-19 ENCOUNTER — Encounter (HOSPITAL_COMMUNITY): Admission: RE | Admit: 2016-03-19 | Payer: Medicare Other | Source: Ambulatory Visit

## 2016-03-21 ENCOUNTER — Encounter (HOSPITAL_COMMUNITY): Payer: Medicare Other

## 2016-03-23 ENCOUNTER — Encounter (HOSPITAL_COMMUNITY): Payer: Medicare Other

## 2016-03-26 ENCOUNTER — Ambulatory Visit (HOSPITAL_COMMUNITY): Payer: Self-pay | Admitting: *Deleted

## 2016-03-26 ENCOUNTER — Telehealth (HOSPITAL_COMMUNITY): Payer: Self-pay | Admitting: Internal Medicine

## 2016-03-26 ENCOUNTER — Encounter (HOSPITAL_COMMUNITY): Payer: Self-pay | Admitting: *Deleted

## 2016-03-26 ENCOUNTER — Encounter (HOSPITAL_COMMUNITY): Payer: Medicare Other

## 2016-03-26 NOTE — Progress Notes (Signed)
Discharge Summary  Patient Details  Name: [name] [name] MRN: [id number] Date of Birth: 1945/08/27 Referring Provider:   Flowsheet Row CARDIAC REHAB PHASE II EXERCISE from 02/20/2016 in [facility] CARDIAC [facility]  Referring Provider  Armanda Magic MD       Number of Visits:   Reason for Discharge:  D/C early exit. Pt phoned on 9/11 to d/c from the cardiac rehab program. Pt cites too many appts with wife who has cancer and caring for his father in law who is sick.  Smoking History:  History  Smoking Status  . Never Smoker  Smokeless Tobacco  . Never Used    Diagnosis:  No diagnosis found.  ADL UCSD:   Initial Exercise Prescription:     Initial Exercise Prescription - 02/20/16 1600      Date of Initial Exercise RX and Referring Provider   Date 02/20/16   Referring Provider Armanda Magic MD     Treadmill   MPH 2.8   Grade 1   Minutes 10   METs 3.53     Bike   Level 1.4   Minutes 10   METs 3.78     NuStep   Level 3   Minutes 10   METs 2.8     Prescription Details   Frequency (times per week) 3   Duration Progress to 30 minutes of continuous aerobic without signs/symptoms of physical distress     Intensity   THRR 40-80% of Max Heartrate 60-121   Ratings of Perceived Exertion 11-13   Perceived Dyspnea 0-4     Progression   Progression Continue to progress workloads to maintain intensity without signs/symptoms of physical distress.     Resistance Training   Training Prescription Yes   Weight 2lbs   Reps 10-12      Discharge Exercise Prescription (Final Exercise Prescription Changes):     Exercise Prescription Changes - 03/16/16 1709      Exercise Review   Progression Yes     Response to Exercise   Blood Pressure (Admit) 126/84   Blood Pressure (Exercise) 140/60   Blood Pressure (Exit) 128/80   Heart Rate (Admit) 87 bpm   Heart Rate (Exercise) 120 bpm   Heart Rate (Exit) 87 bpm   Rating of Perceived Exertion (Exercise) 11   Duration Progress to 30 minutes of continuous aerobic without signs/symptoms of physical distress   Intensity THRR unchanged     Progression   Progression Continue to progress workloads to maintain intensity without signs/symptoms of physical distress.   Average METs 3     Resistance Training   Training Prescription Yes   Weight 4LBS   Reps 10-12     Treadmill   MPH 2.5   Grade 1   Minutes 10   METs 3.26     Bike   Level 1.4   Minutes 10   METs 3.76     NuStep   Level 4   Minutes 10   METs 2.8     Home Exercise Plan   Plans to continue exercise at Home   Frequency Add 3 additional days to program exercise sessions.      Functional Capacity:     6 Minute Walk    Row Name 02/20/16 1623         6 Minute Walk   Phase Initial     Distance 1800 feet     Walk Time 6 minutes     # of Rest Breaks 0  MPH 3.41     METS 3.86     RPE 12     VO2 Peak 13.51     Symptoms No     Resting HR 86 bpm     Resting BP 142/86     Max Ex. HR 130 bpm     Max Ex. BP 144/84     2 Minute Post BP 124/84        Psychological, QOL, Others - Outcomes: PHQ 2/9: Depression screen PHQ 2/9 02/20/2016  Decreased Interest 0  Down, Depressed, Hopeless 0  PHQ - 2 Score 0    Quality of Life:     Quality of Life - 02/20/16 1709      Quality of Life Scores   GLOBAL Post --  Reviewed with the patient      Personal Goals: Goals established at orientation with interventions provided to work toward goal.     Personal Goals and Risk Factors at Admission - 02/14/16 1504      Core Components/Risk Factors/Patient Goals on Admission    Weight Management Weight Loss;Obesity   Hypertension Yes   Intervention Provide education on lifestyle modifcations including regular physical activity/exercise, weight management, moderate sodium restriction and increased consumption of fresh fruit, vegetables, and low fat dairy, alcohol moderation, and smoking cessation.;Monitor prescription use  compliance.   Expected Outcomes Short Term: Continued assessment and intervention until BP is < 140/3690mm HG in hypertensive participants. < 130/980mm HG in hypertensive participants with diabetes, heart failure or chronic kidney disease.;Long Term: Maintenance of blood pressure at goal levels.   Lipids Yes   Intervention Provide education and support for participant on nutrition & aerobic/resistive exercise along with prescribed medications to achieve LDL 70mg , HDL >40mg .   Expected Outcomes Short Term: Participant states understanding of desired cholesterol values and is compliant with medications prescribed. Participant is following exercise prescription and nutrition guidelines.;Long Term: Cholesterol controlled with medications as prescribed, with individualized exercise RX and with personalized nutrition plan. Value goals: LDL < 70mg , HDL > 40 mg.       Personal Goals Discharge:   Nutrition & Weight - Outcomes:     Pre Biometrics - 02/14/16 1500      Pre Biometrics   Height 5\' 6"  (1.676 m)   Weight 210 lb 5.1 oz (95.4 kg)   Waist Circumference 41.75 inches   Hip Circumference 43.75 inches   Waist to Hip Ratio 0.95 %   BMI (Calculated) 34   Triceps Skinfold 15 mm   % Body Fat 31 %   Grip Strength 52 kg   Flexibility 14.5 in   Single Leg Stand 6.4 seconds         Post Biometrics - 04/16/16 1714       Post  Biometrics   Weight 211 lb 10.3 oz (96 kg)      Nutrition:     Nutrition Therapy & Goals - 02/15/16 1505      Nutrition Therapy   Diet Therapeutic Lifestyle Changes     Personal Nutrition Goals   Personal Goal #1 1-2 lb wt loss per week to a wt loss goal of 6-24 lb   Personal Goal #2 Pt to have a basic understanding of a diabetic diet     Intervention Plan   Intervention Prescribe, educate and counsel regarding individualized specific dietary modifications aiming towards targeted core components such as weight, hypertension, lipid management, diabetes, heart  failure and other comorbidities.   Expected Outcomes Short Term Goal: Understand basic  principles of dietary content, such as calories, fat, sodium, cholesterol and nutrients.;Long Term Goal: Adherence to prescribed nutrition plan.      Nutrition Discharge:     Nutrition Assessments - 02/15/16 1503      MEDFICTS Scores   Pre Score 53  will verify score with pt      Education Questionnaire Score:     Knowledge Questionnaire Score - 02/14/16 1426      Knowledge Questionnaire Score   Pre Score 24/24      Goals reviewed with patient Pt discharged from cardiac rehab program today with completion of  exercise sessions in Phase II Repeat  PHQ score-1.  Pt with some stressors related to pt wife is undegoing treatment for cancer. Pt also has assumed the responsibility to care for his father in law.  Pt plans to continue exercise on his own as his schedule allows. Alanson Aly, BSN

## 2016-03-28 ENCOUNTER — Encounter (HOSPITAL_COMMUNITY): Admission: RE | Admit: 2016-03-28 | Payer: Medicare Other | Source: Ambulatory Visit

## 2016-03-28 ENCOUNTER — Encounter (HOSPITAL_COMMUNITY): Payer: Medicare Other

## 2016-03-30 ENCOUNTER — Encounter (HOSPITAL_COMMUNITY): Payer: Medicare Other

## 2016-04-02 ENCOUNTER — Encounter (HOSPITAL_COMMUNITY): Payer: Medicare Other

## 2016-04-04 ENCOUNTER — Encounter (HOSPITAL_COMMUNITY): Payer: Medicare Other

## 2016-04-06 ENCOUNTER — Encounter (HOSPITAL_COMMUNITY): Payer: Medicare Other

## 2016-04-09 ENCOUNTER — Encounter (HOSPITAL_COMMUNITY): Payer: Medicare Other

## 2016-04-11 ENCOUNTER — Encounter (HOSPITAL_COMMUNITY): Payer: Medicare Other

## 2016-04-13 ENCOUNTER — Encounter (HOSPITAL_COMMUNITY): Payer: Medicare Other

## 2016-04-16 ENCOUNTER — Encounter (HOSPITAL_COMMUNITY): Payer: Medicare Other

## 2016-04-16 NOTE — Addendum Note (Signed)
Encounter addended by: Journie Howson D Aubert Choyce on: 04/16/2016  5:19 PM<BR>    Actions taken: Flowsheet accepted, Visit Navigator Flowsheet section accepted

## 2016-04-18 ENCOUNTER — Encounter (HOSPITAL_COMMUNITY): Payer: Medicare Other

## 2016-04-19 ENCOUNTER — Encounter (HOSPITAL_COMMUNITY): Payer: Self-pay | Admitting: *Deleted

## 2016-04-20 ENCOUNTER — Encounter (HOSPITAL_COMMUNITY): Payer: Medicare Other

## 2016-04-23 ENCOUNTER — Encounter (HOSPITAL_COMMUNITY): Payer: Medicare Other

## 2016-04-25 ENCOUNTER — Encounter (HOSPITAL_COMMUNITY): Payer: Medicare Other

## 2016-04-27 ENCOUNTER — Encounter (HOSPITAL_COMMUNITY): Payer: Medicare Other

## 2016-04-30 ENCOUNTER — Encounter (HOSPITAL_COMMUNITY): Payer: Medicare Other

## 2016-05-02 ENCOUNTER — Encounter (HOSPITAL_COMMUNITY): Payer: Medicare Other

## 2016-05-04 ENCOUNTER — Encounter (HOSPITAL_COMMUNITY): Payer: Medicare Other

## 2016-05-07 ENCOUNTER — Encounter (HOSPITAL_COMMUNITY): Payer: Medicare Other

## 2016-05-09 ENCOUNTER — Encounter (HOSPITAL_COMMUNITY): Payer: Medicare Other

## 2016-05-11 ENCOUNTER — Encounter (HOSPITAL_COMMUNITY): Payer: Medicare Other

## 2016-05-14 ENCOUNTER — Encounter (HOSPITAL_COMMUNITY): Payer: Medicare Other

## 2016-05-16 ENCOUNTER — Encounter (HOSPITAL_COMMUNITY): Payer: Medicare Other

## 2016-05-18 ENCOUNTER — Encounter (HOSPITAL_COMMUNITY): Payer: Medicare Other

## 2016-05-21 ENCOUNTER — Encounter (HOSPITAL_COMMUNITY): Payer: Medicare Other

## 2016-05-23 ENCOUNTER — Encounter (HOSPITAL_COMMUNITY): Payer: Medicare Other

## 2016-05-25 ENCOUNTER — Encounter (HOSPITAL_COMMUNITY): Payer: Medicare Other

## 2016-05-28 ENCOUNTER — Encounter (HOSPITAL_COMMUNITY): Payer: Medicare Other

## 2016-05-30 ENCOUNTER — Encounter (HOSPITAL_COMMUNITY): Payer: Medicare Other

## 2016-06-01 ENCOUNTER — Encounter (HOSPITAL_COMMUNITY): Payer: Medicare Other

## 2016-06-04 ENCOUNTER — Encounter (HOSPITAL_COMMUNITY): Payer: Medicare Other

## 2016-06-06 ENCOUNTER — Encounter (HOSPITAL_COMMUNITY): Payer: Medicare Other

## 2016-06-08 ENCOUNTER — Encounter (HOSPITAL_COMMUNITY): Payer: Medicare Other

## 2016-06-11 ENCOUNTER — Encounter (HOSPITAL_COMMUNITY): Payer: Medicare Other

## 2016-06-13 ENCOUNTER — Encounter (HOSPITAL_COMMUNITY): Payer: Medicare Other

## 2016-06-15 ENCOUNTER — Encounter (HOSPITAL_COMMUNITY): Payer: Medicare Other

## 2016-06-18 ENCOUNTER — Encounter (HOSPITAL_COMMUNITY): Payer: Medicare Other

## 2016-06-20 ENCOUNTER — Encounter (HOSPITAL_COMMUNITY): Payer: Medicare Other

## 2016-06-22 ENCOUNTER — Encounter (HOSPITAL_COMMUNITY): Payer: Medicare Other

## 2016-07-22 NOTE — Progress Notes (Signed)
Cardiology Office Note    Date:  07/23/2016   ID:  [name] [name] 1945/09/12, MRN [id number]  PCP:  Pearla Dubonnet, MD  Cardiologist:  Armanda Magic, MD   Chief Complaint  Patient presents with  . Coronary Artery Disease  . Hypertension  . Hyperlipidemia    History of Present Illness:  [name] [name] is a 71 y.o. male who presents for followup of chest pain. He underwent myoview which was neg for ischemia but Chest pain continued. Cardiac CT showed coronary calcium score 1951 and severe > 70 % stenosis in prox LAD. Possible significant disease in the ostial RCA and LCX. It was felt that he could have  balanced ischemia on nuc. Cath showed severe CAD and underwent CABG by Dr. Cornelius Moras 11/04/15 with LIMA to distal LAD, SVG to 1st OM of LCx, seq VG to 2nd OM of LCX. He had normal LV systolic function. He is doing well today.  He denies any anginal CP, SOB, DOE, LE edema.  He has not had any dizziness, palpitations or syncope.     Past Medical History:  Diagnosis Date  . CAD (coronary artery disease), native coronary artery 09/12/2015  . Chronic back pain    herniated disc  . CKD (chronic kidney disease), stage III   . Diverticulosis   . GERD (gastroesophageal reflux disease)    takes Omeprazole daily  . History of gout    takes Allopurinol daily  . History of kidney stones   . History of seasonal allergies    takes Loratadine daily  . Hyperlipidemia    takes Crestor daily  . Hypertension    takes Norvasc and Micardis daily  . Joint pain   . Kidney stones   . Muscle cramps    takes KDUr and Mag Ox as a trial to see if it will help  . Nocturia   . Pneumonia    history of double 30+yrs ago  . 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 BACK SURGERY    . CARDIAC CATHETERIZATION N/A 10/28/2015   Procedure: Left Heart Cath and Coronary Angiography;  Surgeon: [name], MD;  Location: [facility] INVASIVE CV LAB;  Service: Cardiovascular;  Laterality: N/A;  . COLONOSCOPY    . CORONARY ARTERY BYPASS GRAFT N/A 11/04/2015   Procedure: CORONARY ARTERY BYPASS GRAFTING (CABG) times three using left internal mammary artery and right saphenous vein harvested with endoscope.;  Surgeon: [name], MD;  Location: [facility] OR;  Service: Open Heart Surgery;  Laterality: N/A;  . CYSTOSCOPY  1993  . CYSTOSCOPY WITH URETEROSCOPY  07/28/2012   Procedure: CYSTOSCOPY WITH URETEROSCOPY;  Surgeon: [name], MD;  Location: [facility] ORS;  Service: Urology;  Laterality: Left;     . ESOPHAGOGASTRODUODENOSCOPY     with diliation  . FOOT SURGERY  49yrs ago   right-removed bone spur   . HOLMIUM LASER APPLICATION  07/28/2012   Procedure: HOLMIUM LASER APPLICATION;  Surgeon: [name], MD;  Location: [facility] ORS;  Service: Urology;  Laterality: Left;  left lithotripsy  . LITHOTRIPSY     x 2  . LUMBAR LAMINECTOMY  03-07-2012   with fusion  . TEE WITHOUT CARDIOVERSION N/A 11/04/2015   Procedure: TRANSESOPHAGEAL ECHOCARDIOGRAM (TEE);  Surgeon: [name], MD;  Location: [facility] OR;  Service: Open Heart Surgery;  Laterality: N/A;  . TONSILLECTOMY    .  VASECTOMY  4759yrs ago    Current Medications: Outpatient Medications Prior to Visit  Medication Sig Dispense Refill  . allopurinol (ZYLOPRIM) 100 MG tablet Take 100 mg by mouth 2 (two) times daily.     [name]. aspirin EC 81 MG EC tablet Take 1 tablet (81 mg total) by mouth daily.    . cholecalciferol (VITAMIN D) 1000 UNITS tablet Take 1,000 Units by mouth daily.    . Coenzyme Q10 (COQ10) 100 MG CAPS Take 1 capsule by mouth daily.     [name]. ezetimibe (ZETIA) 10 MG tablet Take 10 mg by mouth daily.    . fexofenadine (ALLEGRA) 180 MG tablet Take 180 mg by mouth daily as needed. (allergies)    . loratadine (CLARITIN) 10 MG tablet Take 10 mg by mouth daily.    . metoprolol tartrate (LOPRESSOR) 25 MG tablet Take 1 tablet (25 mg total) by mouth 2 (two) times daily.  180 tablet 3  . Multiple Vitamin (MULTIVITAMIN WITH MINERALS) TABS Take 1 tablet by mouth daily.    . pantoprazole (PROTONIX) 40 MG tablet Take 40 mg by mouth daily.    . pravastatin (PRAVACHOL) 20 MG tablet Take 1 tablet (20 mg total) by mouth every evening. 30 tablet 11  . Tamsulosin HCl (FLOMAX) 0.4 MG CAPS Take 0.4 mg by mouth daily after supper.    . telmisartan (MICARDIS) 20 MG tablet Take 1 tablet (20 mg total) by mouth every morning. 90 tablet 3   No facility-administered medications prior to visit.      Allergies:   Atorvastatin; Hctz [hydrochlorothiazide]; Rosuvastatin; and Other   Social History   Social History  . Marital status: Married    Spouse name: N/A  . Number of children: N/A  . Years of education: N/A   Social History Main Topics  . Smoking status: Never Smoker  . Smokeless tobacco: Never Used  . Alcohol use No  . Drug use: No  . Sexual activity: Yes   Other Topics Concern  . None   Social History Narrative  . None     Family History:  The patient's family history includes Dementia in his mother; Prostate cancer in his father.   ROS:   Please see the history of present illness.    ROS All other systems reviewed and are negative.  No flowsheet data found.     PHYSICAL EXAM:   VS:  BP (!) 147/85   Pulse 77   Ht 5\' 6"  (1.676 m)   Wt 214 lb 1.9 oz (97.1 kg)   BMI 34.56 kg/m    GEN: Well nourished, well developed, in no acute distress  HEENT: normal  Neck: no JVD, carotid bruits, or masses Cardiac: RRR; no murmurs, rubs, or gallops,no edema.  Intact distal pulses bilaterally.  Respiratory:  clear to auscultation bilaterally, normal work of breathing GI: soft, nontender, nondistended, + BS MS: no deformity or atrophy  Skin: warm and dry, no rash Neuro:  Alert and Oriented x 3, Strength and sensation are intact Psych: euthymic mood, full affect  Wt Readings from Last 3 Encounters:  07/23/16 214 lb 1.9 oz (97.1 kg)  04/16/16 211 lb 10.3 oz  (96 kg)  02/20/16 210 lb 8.6 oz (95.5 kg)      Studies/Labs Reviewed:   EKG:  EKG is not ordered today.    Recent Labs: 11/05/2015: Magnesium 2.5 11/22/2015: Hemoglobin 11.2; Platelets 320 12/13/2015: BUN 13; Creat 1.49; Potassium 4.0; Sodium 141 12/19/2015: ALT 19   Lipid Panel  Component Value Date/Time   CHOL 138 12/19/2015 1005   TRIG 155 (H) 12/19/2015 1005   HDL 41 12/19/2015 1005   CHOLHDL 3.4 12/19/2015 1005   VLDL 31 (H) 12/19/2015 1005   LDLCALC 66 12/19/2015 1005    Additional studies/ records that were reviewed today include:  none    ASSESSMENT:    1. Coronary artery disease involving native coronary artery of native heart without angina pectoris   2. Essential (primary) hypertension   3. Dyslipidemia      PLAN:  In order of problems listed above:  1. ASCAD s/p CABG with LIMA to distal LAD, SVG to 1st OM of LCx, seq VG to 2nd OM of LCX.- he has not had any further anginal CP.  Continue ASA/BB and statin.  2. HTN - BP borderline controlled on current meds. Continue BB and ARB. I have asked him to check his BP daily for a week and call with results  3. Hyperlipidemia with LDL goal < 70. Continue statin.  Check FLp and ALT    Medication Adjustments/Labs and Tests Ordered: Current medicines are reviewed at length with the patient today.  Concerns regarding medicines are outlined above.  Medication changes, Labs and Tests ordered today are listed in the Patient Instructions below.  There are no Patient Instructions on file for this visit.   Signed, Armanda Magic, MD  07/23/2016 12:57 PM    [facility] [address] [place], [place], [place]  [address] Phone: [phone]; Fax: [phone]

## 2016-07-23 ENCOUNTER — Ambulatory Visit (INDEPENDENT_AMBULATORY_CARE_PROVIDER_SITE_OTHER): Payer: Medicare Other | Admitting: Cardiology

## 2016-07-23 ENCOUNTER — Encounter: Payer: Self-pay | Admitting: Cardiology

## 2016-07-23 VITALS — BP 147/85 | HR 77 | Ht 66.0 in | Wt 214.1 lb

## 2016-07-23 DIAGNOSIS — E785 Hyperlipidemia, unspecified: Secondary | ICD-10-CM

## 2016-07-23 DIAGNOSIS — I10 Essential (primary) hypertension: Secondary | ICD-10-CM

## 2016-07-23 DIAGNOSIS — I2510 Atherosclerotic heart disease of native coronary artery without angina pectoris: Secondary | ICD-10-CM

## 2016-07-23 MED ORDER — EZETIMIBE 10 MG PO TABS: 10.0000 mg | ORAL_TABLET | Freq: Every day | ORAL | 3 refills | Status: DC

## 2016-07-23 NOTE — Patient Instructions (Addendum)
Medication Instructions:  Your physician recommends that you continue on your current medications as directed. Please refer to the Current Medication list given to you today.   Labwork: You have a FASTING lab appointment Thursday, January 11. You may come ANY TIME between 7:30 AM and 5:00 PM to have your labs drawn.  Testing/Procedures: None  Follow-Up: Your physician wants you to follow-up in: 1 year with Dr. Mayford Knifeurner. You will receive a reminder letter in the mail two months in advance. If you don't receive a letter, please call our office to schedule the follow-up appointment.   Any Other Special Instructions Will Be Listed Below (If Applicable). Please check your BLOOD PRESSURE daily for a week and call with results.     If you need a refill on your cardiac medications before your next appointment, please call your pharmacy.

## 2016-07-26 ENCOUNTER — Other Ambulatory Visit: Payer: Medicare Other | Admitting: *Deleted

## 2016-07-26 DIAGNOSIS — E785 Hyperlipidemia, unspecified: Secondary | ICD-10-CM

## 2016-07-27 ENCOUNTER — Telehealth: Payer: Self-pay | Admitting: *Deleted

## 2016-07-27 DIAGNOSIS — E785 Hyperlipidemia, unspecified: Secondary | ICD-10-CM

## 2016-07-27 DIAGNOSIS — I2510 Atherosclerotic heart disease of native coronary artery without angina pectoris: Secondary | ICD-10-CM

## 2016-07-27 LAB — HEPATIC FUNCTION PANEL
ALK PHOS: 88 IU/L (ref 39–117)
ALT: 26 IU/L (ref 0–44)
AST: 23 IU/L (ref 0–40)
Albumin: 4.3 g/dL (ref 3.6–4.8)
BILIRUBIN, DIRECT: 0.15 mg/dL (ref 0.00–0.40)
Bilirubin Total: 0.5 mg/dL (ref 0.0–1.2)
TOTAL PROTEIN: 7.2 g/dL (ref 6.0–8.5)

## 2016-07-27 LAB — LIPID PANEL
CHOLESTEROL TOTAL: 169 mg/dL (ref 100–199)
Chol/HDL Ratio: 3.8 ratio units (ref 0.0–5.0)
HDL: 45 mg/dL (ref >39)
LDL CALC: 82 mg/dL (ref 0–99)
Triglycerides: 211 mg/dL — ABNORMAL HIGH (ref 0–149)
VLDL Cholesterol Cal: 42 mg/dL — ABNORMAL HIGH (ref 5–40)

## 2016-07-27 MED ORDER — PRAVASTATIN SODIUM 40 MG PO TABS: 40.0000 mg | ORAL_TABLET | Freq: Every evening | ORAL | 3 refills | Status: DC

## 2016-07-27 NOTE — Telephone Encounter (Signed)
Pt notified of lab results and findings by phone with verbal understanding to plan of care. [facility] [address] Pravastatin 40 mg, FLP/LFT 09/07/16. Pt stated Dr. Mayford Knifeurner wanted him to monitor his BP and he forgot for a couple of days. Pt said he started monitoring BP the last 2 days and will monitor over the weekend as well. I advised pt to call Payton MccallumKaty K. RN for Dr. Mayford Knifeurner to give BP readings on Monday. Pt is agreeable to plan of care.

## 2016-07-30 ENCOUNTER — Telehealth: Payer: Self-pay | Admitting: Cardiology

## 2016-07-30 NOTE — Telephone Encounter (Signed)
[name] [name] is calling to report BP readings for the past five days,each reading completed two hours after taking medications. If you need to contact you can call both home and mobile number to contact. Thanks.  07-26-16 147/89 07-27-16 133/73 07-28-16 136/76 07-29-16 155/89 07-30-16 135/77

## 2016-07-31 NOTE — Telephone Encounter (Signed)
BP controlled for the most part.  No change in meds at this time

## 2016-07-31 NOTE — Telephone Encounter (Signed)
Informed patient that Dr. Mayford Knifeurner reviewed BP and is not going to make changes. He was grateful for call and agrees with treatment plan.

## 2016-08-07 ENCOUNTER — Other Ambulatory Visit: Payer: Self-pay | Admitting: Cardiology

## 2016-08-09 NOTE — Telephone Encounter (Signed)
Medication Detail    Disp Refills Start End   pravastatin (PRAVACHOL) 40 MG tablet 90 tablet 3 07/27/2016 10/25/2016   Sig - Route: Take 1 tablet (40 mg total) by mouth every evening. - Oral   E-Prescribing Status: Receipt confirmed by pharmacy (07/27/2016 4:25 PM EST)   Associated Diagnoses   Coronary artery disease involving native coronary artery of native heart without angina pectoris - Primary     Dyslipidemia     Pharmacy   [id number] - [place] - [id number]

## 2016-08-09 NOTE — Telephone Encounter (Signed)
Okay to refill pantoprazole or [facility]? Please advise. Thanks, MI

## 2016-08-09 NOTE — Telephone Encounter (Signed)
Notes Recorded by Quintella Reichertraci R Turner, MD on 10/18/2015 at 11:15 AM Distal esophageal wall thickening can be seen with gastroesophageal reflux disease. Coronary portion of study pending. Please set patient up with [facility] for further evaluation and change prilosec to Protonix 40mg  daily.

## 2016-09-07 ENCOUNTER — Other Ambulatory Visit: Payer: Medicare Other | Admitting: *Deleted

## 2016-09-07 DIAGNOSIS — I2510 Atherosclerotic heart disease of native coronary artery without angina pectoris: Secondary | ICD-10-CM

## 2016-09-07 DIAGNOSIS — E785 Hyperlipidemia, unspecified: Secondary | ICD-10-CM

## 2016-09-07 LAB — HEPATIC FUNCTION PANEL
ALBUMIN: 4.1 g/dL (ref 3.5–4.8)
ALT: 22 IU/L (ref 0–44)
AST: 24 IU/L (ref 0–40)
Alkaline Phosphatase: 84 IU/L (ref 39–117)
BILIRUBIN TOTAL: 0.4 mg/dL (ref 0.0–1.2)
Bilirubin, Direct: 0.13 mg/dL (ref 0.00–0.40)
TOTAL PROTEIN: 7 g/dL (ref 6.0–8.5)

## 2016-09-07 LAB — LIPID PANEL
Chol/HDL Ratio: 3.4 ratio units (ref 0.0–5.0)
Cholesterol, Total: 149 mg/dL (ref 100–199)
HDL: 44 mg/dL (ref >39)
LDL CALC: 65 mg/dL (ref 0–99)
Triglycerides: 202 mg/dL — ABNORMAL HIGH (ref 0–149)
VLDL CHOLESTEROL CAL: 40 mg/dL (ref 5–40)

## 2016-10-31 ENCOUNTER — Other Ambulatory Visit: Payer: Self-pay | Admitting: *Deleted

## 2016-10-31 MED ORDER — METOPROLOL TARTRATE 25 MG PO TABS
25.0000 mg | ORAL_TABLET | Freq: Two times a day (BID) | ORAL | 2 refills | Status: DC
Start: 2016-10-31 — End: 2017-07-24

## 2016-11-05 ENCOUNTER — Encounter: Payer: Self-pay | Admitting: Thoracic Surgery (Cardiothoracic Vascular Surgery)

## 2016-11-05 ENCOUNTER — Ambulatory Visit (INDEPENDENT_AMBULATORY_CARE_PROVIDER_SITE_OTHER): Payer: Medicare Other | Admitting: Thoracic Surgery (Cardiothoracic Vascular Surgery)

## 2016-11-05 ENCOUNTER — Encounter: Payer: Medicare Other | Admitting: Thoracic Surgery (Cardiothoracic Vascular Surgery)

## 2016-11-05 VITALS — BP 133/86 | HR 86 | Resp 20 | Ht 66.0 in | Wt 214.0 lb

## 2016-11-05 DIAGNOSIS — I2510 Atherosclerotic heart disease of native coronary artery without angina pectoris: Secondary | ICD-10-CM | POA: Diagnosis not present

## 2016-11-05 DIAGNOSIS — Z951 Presence of aortocoronary bypass graft: Secondary | ICD-10-CM | POA: Diagnosis not present

## 2016-11-05 NOTE — Progress Notes (Signed)
[address]       [place] [address]             [phone]     CARDIOTHORACIC SURGERY OFFICE NOTE  Referring Provider is [name], MD  Primary Cardiologist is Quintella Reichert, MD PCP is Pearla Dubonnet, MD   HPI:  Patient is a 71 year old male with coronary artery disease, hypertension, and hyperlipidemia who returns to the office today for routine follow-up approximately one year status post coronary artery bypass grafting 3 on 11/04/2015.  His postoperative recovery was uneventful and he was last seen here in our office on 12/05/2015. Since then he has done very well. He completed the cardiac rehabilitation program and he continues to exercise on a regular basis. He reports no problems whatsoever. He specifically denies any exertional shortness of breath or chest discomfort. The symptoms of chest pain had prior to surgery completely resolved and have not recurred. He has been seen in follow-up earlier this year by Dr. Mayford Knife. Overall he is doing remarkably well.   Current Outpatient Prescriptions  Medication Sig Dispense Refill  . allopurinol (ZYLOPRIM) 100 MG tablet Take 100 mg by mouth 2 (two) times daily.     [name] aspirin EC 81 MG EC tablet Take 1 tablet (81 mg total) by mouth daily.    . cholecalciferol (VITAMIN D) 1000 UNITS tablet Take 1,000 Units by mouth daily.    . Coenzyme Q10 (COQ10) 100 MG CAPS Take 1 capsule by mouth daily.     [name] ezetimibe (ZETIA) 10 MG tablet Take 1 tablet (10 mg total) by mouth daily. 90 tablet 3  . fexofenadine (ALLEGRA) 180 MG tablet Take 180 mg by mouth daily as needed. (allergies)    . loratadine (CLARITIN) 10 MG tablet Take 10 mg by mouth daily.    . metoprolol tartrate (LOPRESSOR) 25 MG tablet Take 1 tablet (25 mg total) by mouth 2 (two) times daily. 180 tablet 2  . Multiple Vitamin (MULTIVITAMIN WITH MINERALS) TABS Take 1 tablet by mouth daily.    [name] omeprazole (PRILOSEC) 20 MG capsule Take 20 mg by mouth daily.     . Tamsulosin HCl (FLOMAX) 0.4 MG CAPS Take 0.4 mg by mouth daily after supper.    . telmisartan (MICARDIS) 20 MG tablet Take 1 tablet (20 mg total) by mouth every morning. 90 tablet 3  . pravastatin (PRAVACHOL) 40 MG tablet Take 1 tablet (40 mg total) by mouth every evening. 90 tablet 3   No current facility-administered medications for this visit.       Physical Exam:   BP 133/86   Pulse 86   Resp 20   Ht  (1.676 m)   Wt 214 lb (97.1 kg)   SpO2 97% Comment: RA  BMI 34.54 kg/m   General:  Well-appearing  Chest:   Clear to auscultation  CV:   Regular rate and rhythm without murmur  Incisions:  Completely healed, sternum is stable  Abdomen:  Soft nontender  Extremities:  Warm and well-perfused  Diagnostic Tests:  n/a   Impression:  Patient is doing very well approximately one year status post coronary artery bypass grafting  Plan:  We have not recommended any changes to the patient's current medications. He has been reminded regarding the many benefits of regular exercise and a heart healthy diet. We have discussed adherence to his current medical therapy and the need for long-term follow-up with his cardiologist and primary care physician. All  of his questions been addressed. In the future he will call and return to see [place] only should specific problems or questions arise.  I spent in excess of 15 minutes during the conduct of this office consultation and >50% of this time involved direct face-to-face encounter with the patient for counseling and/or coordination of their care.    Salvatore Decent. Cornelius Moras, MD 11/05/2016 9:54 AM

## 2016-11-05 NOTE — Patient Instructions (Addendum)
Continue all previous medications without any changes at this time  Make every effort to stay physically active, get some type of exercise on a regular basis, and stick to a "heart healthy diet".  The long term benefits for regular exercise and a healthy diet are critically important to your overall health and wellbeing.  

## 2017-03-08 DIAGNOSIS — M109 Gout, unspecified: Secondary | ICD-10-CM | POA: Insufficient documentation

## 2017-03-08 DIAGNOSIS — Z888 Allergy status to other drugs, medicaments and biological substances status: Secondary | ICD-10-CM | POA: Diagnosis not present

## 2017-03-08 DIAGNOSIS — I2510 Atherosclerotic heart disease of native coronary artery without angina pectoris: Secondary | ICD-10-CM | POA: Diagnosis not present

## 2017-03-08 DIAGNOSIS — Z8719 Personal history of other diseases of the digestive system: Secondary | ICD-10-CM | POA: Diagnosis not present

## 2017-03-08 DIAGNOSIS — E669 Obesity, unspecified: Secondary | ICD-10-CM | POA: Insufficient documentation

## 2017-03-08 DIAGNOSIS — Z6834 Body mass index (BMI) 34.0-34.9, adult: Secondary | ICD-10-CM | POA: Insufficient documentation

## 2017-03-08 DIAGNOSIS — Z87442 Personal history of urinary calculi: Secondary | ICD-10-CM | POA: Diagnosis not present

## 2017-03-08 DIAGNOSIS — Z79899 Other long term (current) drug therapy: Secondary | ICD-10-CM | POA: Insufficient documentation

## 2017-03-08 DIAGNOSIS — I700 Atherosclerosis of aorta: Secondary | ICD-10-CM | POA: Insufficient documentation

## 2017-03-08 DIAGNOSIS — K219 Gastro-esophageal reflux disease without esophagitis: Secondary | ICD-10-CM | POA: Diagnosis not present

## 2017-03-08 DIAGNOSIS — Z7982 Long term (current) use of aspirin: Secondary | ICD-10-CM | POA: Diagnosis not present

## 2017-03-08 DIAGNOSIS — K5730 Diverticulosis of large intestine without perforation or abscess without bleeding: Secondary | ICD-10-CM | POA: Insufficient documentation

## 2017-03-08 DIAGNOSIS — N132 Hydronephrosis with renal and ureteral calculous obstruction: Secondary | ICD-10-CM | POA: Diagnosis not present

## 2017-03-08 DIAGNOSIS — E785 Hyperlipidemia, unspecified: Secondary | ICD-10-CM | POA: Insufficient documentation

## 2017-03-08 DIAGNOSIS — Z951 Presence of aortocoronary bypass graft: Secondary | ICD-10-CM | POA: Insufficient documentation

## 2017-03-08 DIAGNOSIS — N183 Chronic kidney disease, stage 3 (moderate): Secondary | ICD-10-CM | POA: Diagnosis not present

## 2017-03-08 DIAGNOSIS — I129 Hypertensive chronic kidney disease with stage 1 through stage 4 chronic kidney disease, or unspecified chronic kidney disease: Secondary | ICD-10-CM | POA: Insufficient documentation

## 2017-03-08 DIAGNOSIS — K8020 Calculus of gallbladder without cholecystitis without obstruction: Secondary | ICD-10-CM | POA: Diagnosis not present

## 2017-03-08 DIAGNOSIS — N179 Acute kidney failure, unspecified: Secondary | ICD-10-CM | POA: Insufficient documentation

## 2017-03-08 DIAGNOSIS — N23 Unspecified renal colic: Secondary | ICD-10-CM | POA: Diagnosis present

## 2017-03-08 DIAGNOSIS — N281 Cyst of kidney, acquired: Secondary | ICD-10-CM | POA: Insufficient documentation

## 2017-03-08 LAB — URINALYSIS, ROUTINE W REFLEX MICROSCOPIC
Bacteria, UA: NONE SEEN
Bilirubin Urine: NEGATIVE
Glucose, UA: NEGATIVE mg/dL
Ketones, ur: NEGATIVE mg/dL
Leukocytes, UA: NEGATIVE
Nitrite: NEGATIVE
PROTEIN: NEGATIVE mg/dL
SQUAMOUS EPITHELIAL / LPF: NONE SEEN
Specific Gravity, Urine: 1.03 (ref 1.005–1.030)
pH: 5.5 (ref 5.0–8.0)

## 2017-03-08 MED ORDER — ONDANSETRON 4 MG PO TBDP
4.0000 mg | ORAL_TABLET | Freq: Once | ORAL | Status: AC
  Administered 2017-03-08: 4 mg via ORAL
  Filled 2017-03-08: qty 1

## 2017-03-08 MED ORDER — OXYCODONE-ACETAMINOPHEN 5-325 MG PO TABS
1.0000 | ORAL_TABLET | ORAL | Status: DC | PRN
  Administered 2017-03-08: 1 via ORAL
  Filled 2017-03-08: qty 1

## 2017-03-08 NOTE — ED Triage Notes (Signed)
PResents with left sided flank pain associated with frequency. Pt has known kidney stones, he is currently taking tamsulosin. Pain is rated as severe. Denies hematuria.

## 2017-03-09 ENCOUNTER — Emergency Department (HOSPITAL_COMMUNITY): Payer: Medicare Other

## 2017-03-09 ENCOUNTER — Encounter (HOSPITAL_COMMUNITY): Payer: Self-pay | Admitting: Emergency Medicine

## 2017-03-09 ENCOUNTER — Emergency Department (HOSPITAL_COMMUNITY): Payer: Medicare Other | Admitting: Anesthesiology

## 2017-03-09 ENCOUNTER — Observation Stay (HOSPITAL_COMMUNITY)
Admission: EM | Admit: 2017-03-09 | Discharge: 2017-03-10 | Disposition: A | Discharge status: Home / Self Care | Payer: Medicare Other | Attending: Urology | Admitting: Urology

## 2017-03-09 ENCOUNTER — Encounter (HOSPITAL_COMMUNITY)
Admission: EM | Disposition: A | Discharge status: Home / Self Care | Payer: Self-pay | Source: Home / Self Care | Attending: Emergency Medicine

## 2017-03-09 DIAGNOSIS — N132 Hydronephrosis with renal and ureteral calculous obstruction: Secondary | ICD-10-CM | POA: Diagnosis not present

## 2017-03-09 DIAGNOSIS — I129 Hypertensive chronic kidney disease with stage 1 through stage 4 chronic kidney disease, or unspecified chronic kidney disease: Secondary | ICD-10-CM | POA: Diagnosis not present

## 2017-03-09 DIAGNOSIS — N23 Unspecified renal colic: Secondary | ICD-10-CM

## 2017-03-09 DIAGNOSIS — N179 Acute kidney failure, unspecified: Secondary | ICD-10-CM | POA: Diagnosis not present

## 2017-03-09 DIAGNOSIS — N183 Chronic kidney disease, stage 3 (moderate): Secondary | ICD-10-CM | POA: Diagnosis not present

## 2017-03-09 HISTORY — PX: HOLMIUM LASER APPLICATION: SHX5852

## 2017-03-09 HISTORY — PX: CYSTOSCOPY WITH RETROGRADE PYELOGRAM, URETEROSCOPY AND STENT PLACEMENT: SHX5789

## 2017-03-09 LAB — CBC WITH DIFFERENTIAL/PLATELET
BASOS PCT: 0 %
Basophils Absolute: 0 10*3/uL (ref 0.0–0.1)
EOS ABS: 0.1 10*3/uL (ref 0.0–0.7)
EOS PCT: 1 %
HCT: 39.6 % (ref 39.0–52.0)
Hemoglobin: 13 g/dL (ref 13.0–17.0)
LYMPHS ABS: 2.6 10*3/uL (ref 0.7–4.0)
Lymphocytes Relative: 20 %
MCH: 27.3 pg (ref 26.0–34.0)
MCHC: 32.8 g/dL (ref 30.0–36.0)
MCV: 83.2 fL (ref 78.0–100.0)
MONOS PCT: 7 %
Monocytes Absolute: 1 10*3/uL (ref 0.1–1.0)
Neutro Abs: 9.3 10*3/uL — ABNORMAL HIGH (ref 1.7–7.7)
Neutrophils Relative %: 72 %
PLATELETS: 145 10*3/uL — AB (ref 150–400)
RBC: 4.76 MIL/uL (ref 4.22–5.81)
RDW: 16.1 % — AB (ref 11.5–15.5)
WBC: 13 10*3/uL — ABNORMAL HIGH (ref 4.0–10.5)

## 2017-03-09 LAB — I-STAT CHEM 8, ED
BUN: 23 mg/dL — ABNORMAL HIGH (ref 6–20)
CALCIUM ION: 1.21 mmol/L (ref 1.15–1.40)
Chloride: 105 mmol/L (ref 101–111)
Creatinine, Ser: 2.1 mg/dL — ABNORMAL HIGH (ref 0.61–1.24)
Glucose, Bld: 107 mg/dL — ABNORMAL HIGH (ref 65–99)
HEMATOCRIT: 41 % (ref 39.0–52.0)
HEMOGLOBIN: 13.9 g/dL (ref 13.0–17.0)
Potassium: 3.9 mmol/L (ref 3.5–5.1)
SODIUM: 141 mmol/L (ref 135–145)
TCO2: 25 mmol/L (ref 22–32)

## 2017-03-09 SURGERY — CYSTOURETEROSCOPY, WITH RETROGRADE PYELOGRAM AND STENT INSERTION
Anesthesia: General | Site: Ureter | Laterality: Left

## 2017-03-09 MED ORDER — DEXTROSE 5 % IV SOLN
2.0000 g | Freq: Once | INTRAVENOUS | Status: AC
  Administered 2017-03-09: 2 g via INTRAVENOUS

## 2017-03-09 MED ORDER — ONDANSETRON HCL 4 MG/2ML IJ SOLN
INTRAMUSCULAR | Status: AC
  Filled 2017-03-09: qty 2

## 2017-03-09 MED ORDER — ONDANSETRON HCL 4 MG/2ML IJ SOLN
4.0000 mg | Freq: Three times a day (TID) | INTRAMUSCULAR | Status: DC | PRN
Start: 2017-03-09 — End: 2017-03-10
  Administered 2017-03-09 (×2): 4 mg via INTRAVENOUS
  Filled 2017-03-09: qty 2

## 2017-03-09 MED ORDER — PROMETHAZINE HCL 25 MG/ML IJ SOLN: 6.2500 mg | INTRAMUSCULAR | Status: DC | PRN

## 2017-03-09 MED ORDER — ALLOPURINOL 100 MG PO TABS
100.0000 mg | ORAL_TABLET | Freq: Two times a day (BID) | ORAL | Status: DC
  Administered 2017-03-09 (×2): 100 mg via ORAL
  Filled 2017-03-09 (×2): qty 1

## 2017-03-09 MED ORDER — PROPOFOL 10 MG/ML IV BOLUS
INTRAVENOUS | Status: DC | PRN
  Administered 2017-03-09: 200 mg via INTRAVENOUS

## 2017-03-09 MED ORDER — LORATADINE 10 MG PO TABS
10.0000 mg | ORAL_TABLET | Freq: Every day | ORAL | Status: DC
  Administered 2017-03-09: 10 mg via ORAL
  Filled 2017-03-09: qty 1

## 2017-03-09 MED ORDER — LIDOCAINE 2% (20 MG/ML) 5 ML SYRINGE
INTRAMUSCULAR | Status: AC
  Filled 2017-03-09: qty 5

## 2017-03-09 MED ORDER — SUCCINYLCHOLINE CHLORIDE 200 MG/10ML IV SOSY
PREFILLED_SYRINGE | INTRAVENOUS | Status: AC
  Filled 2017-03-09: qty 10

## 2017-03-09 MED ORDER — FENTANYL CITRATE (PF) 100 MCG/2ML IJ SOLN
INTRAMUSCULAR | Status: AC
  Filled 2017-03-09: qty 2

## 2017-03-09 MED ORDER — PROPOFOL 10 MG/ML IV BOLUS
INTRAVENOUS | Status: AC
  Filled 2017-03-09: qty 40

## 2017-03-09 MED ORDER — DEXAMETHASONE SODIUM PHOSPHATE 10 MG/ML IJ SOLN
INTRAMUSCULAR | Status: DC | PRN
  Administered 2017-03-09: 10 mg via INTRAVENOUS

## 2017-03-09 MED ORDER — PHENYLEPHRINE 40 MCG/ML (10ML) SYRINGE FOR IV PUSH (FOR BLOOD PRESSURE SUPPORT)
PREFILLED_SYRINGE | INTRAVENOUS | Status: AC
  Filled 2017-03-09: qty 10

## 2017-03-09 MED ORDER — 0.9 % SODIUM CHLORIDE (POUR BTL) OPTIME
TOPICAL | Status: DC | PRN
  Administered 2017-03-09: 1000 mL

## 2017-03-09 MED ORDER — SODIUM CHLORIDE 0.9 % IV SOLN
INTRAVENOUS | Status: DC
  Administered 2017-03-09 (×2): via INTRAVENOUS

## 2017-03-09 MED ORDER — PRAVASTATIN SODIUM 40 MG PO TABS
40.0000 mg | ORAL_TABLET | Freq: Every evening | ORAL | Status: DC
  Administered 2017-03-09: 40 mg via ORAL
  Filled 2017-03-09: qty 1

## 2017-03-09 MED ORDER — SODIUM CHLORIDE 0.9 % IR SOLN
Status: DC | PRN
  Administered 2017-03-09: 4000 mL

## 2017-03-09 MED ORDER — FINASTERIDE 5 MG PO TABS
5.0000 mg | ORAL_TABLET | Freq: Every day | ORAL | Status: DC
  Administered 2017-03-09: 5 mg via ORAL
  Filled 2017-03-09: qty 1

## 2017-03-09 MED ORDER — LIDOCAINE 2% (20 MG/ML) 5 ML SYRINGE
INTRAMUSCULAR | Status: DC | PRN
  Administered 2017-03-09: 80 mg via INTRAVENOUS

## 2017-03-09 MED ORDER — SENNOSIDES-DOCUSATE SODIUM 8.6-50 MG PO TABS
1.0000 | ORAL_TABLET | Freq: Two times a day (BID) | ORAL | Status: DC
  Administered 2017-03-09 (×2): 1 via ORAL
  Filled 2017-03-09 (×2): qty 1

## 2017-03-09 MED ORDER — IRBESARTAN 150 MG PO TABS
75.0000 mg | ORAL_TABLET | Freq: Every day | ORAL | Status: DC
  Administered 2017-03-09: 75 mg via ORAL
  Filled 2017-03-09: qty 1

## 2017-03-09 MED ORDER — ONDANSETRON HCL 4 MG/2ML IJ SOLN
4.0000 mg | Freq: Once | INTRAMUSCULAR | Status: AC
  Administered 2017-03-09: 4 mg via INTRAVENOUS
  Filled 2017-03-09: qty 2

## 2017-03-09 MED ORDER — METOPROLOL TARTRATE 25 MG PO TABS
25.0000 mg | ORAL_TABLET | Freq: Two times a day (BID) | ORAL | Status: DC
  Administered 2017-03-09 (×2): 25 mg via ORAL
  Filled 2017-03-09 (×2): qty 1

## 2017-03-09 MED ORDER — HYDROMORPHONE HCL-NACL 0.5-0.9 MG/ML-% IV SOSY: 0.5000 mg | PREFILLED_SYRINGE | INTRAVENOUS | Status: DC | PRN

## 2017-03-09 MED ORDER — FENTANYL CITRATE (PF) 100 MCG/2ML IJ SOLN
100.0000 ug | Freq: Once | INTRAMUSCULAR | Status: AC
  Administered 2017-03-09: 100 ug via INTRAVENOUS
  Filled 2017-03-09: qty 2

## 2017-03-09 MED ORDER — EZETIMIBE 10 MG PO TABS
10.0000 mg | ORAL_TABLET | Freq: Every day | ORAL | Status: DC
  Administered 2017-03-09: 10 mg via ORAL
  Filled 2017-03-09: qty 1

## 2017-03-09 MED ORDER — FENTANYL CITRATE (PF) 100 MCG/2ML IJ SOLN
50.0000 ug | Freq: Once | INTRAMUSCULAR | Status: AC
  Administered 2017-03-09: 50 ug via INTRAVENOUS
  Filled 2017-03-09: qty 2

## 2017-03-09 MED ORDER — SODIUM CHLORIDE 0.9 % IV SOLN
INTRAVENOUS | Status: DC
  Administered 2017-03-09: 15:00:00 via INTRAVENOUS

## 2017-03-09 MED ORDER — IOHEXOL 300 MG/ML  SOLN
INTRAMUSCULAR | Status: DC | PRN
  Administered 2017-03-09: 50 mL

## 2017-03-09 MED ORDER — HYDROMORPHONE HCL-NACL 0.5-0.9 MG/ML-% IV SOSY: 0.2500 mg | PREFILLED_SYRINGE | INTRAVENOUS | Status: DC | PRN

## 2017-03-09 MED ORDER — FENTANYL CITRATE (PF) 100 MCG/2ML IJ SOLN
INTRAMUSCULAR | Status: DC | PRN
  Administered 2017-03-09: 100 ug via INTRAVENOUS

## 2017-03-09 MED ORDER — ASPIRIN EC 81 MG PO TBEC
81.0000 mg | DELAYED_RELEASE_TABLET | Freq: Every day | ORAL | Status: DC
  Administered 2017-03-09: 81 mg via ORAL
  Filled 2017-03-09: qty 1

## 2017-03-09 MED ORDER — HYDROMORPHONE HCL 1 MG/ML IJ SOLN
1.0000 mg | INTRAMUSCULAR | Status: DC | PRN
  Administered 2017-03-09: 1 mg via INTRAVENOUS
  Filled 2017-03-09: qty 1

## 2017-03-09 MED ORDER — DEXAMETHASONE SODIUM PHOSPHATE 10 MG/ML IJ SOLN
INTRAMUSCULAR | Status: AC
  Filled 2017-03-09: qty 1

## 2017-03-09 MED ORDER — ROCURONIUM BROMIDE 50 MG/5ML IV SOSY
PREFILLED_SYRINGE | INTRAVENOUS | Status: AC
  Filled 2017-03-09: qty 5

## 2017-03-09 MED ORDER — PHENYLEPHRINE 40 MCG/ML (10ML) SYRINGE FOR IV PUSH (FOR BLOOD PRESSURE SUPPORT)
PREFILLED_SYRINGE | INTRAVENOUS | Status: DC | PRN
  Administered 2017-03-09 (×4): 80 ug via INTRAVENOUS

## 2017-03-09 MED ORDER — CEFTRIAXONE SODIUM 2 G IJ SOLR
INTRAMUSCULAR | Status: AC
  Filled 2017-03-09: qty 2

## 2017-03-09 MED ORDER — TAMSULOSIN HCL 0.4 MG PO CAPS
0.4000 mg | ORAL_CAPSULE | Freq: Every day | ORAL | Status: DC
Start: 2017-03-09 — End: 2017-03-10
  Administered 2017-03-09: 0.4 mg via ORAL
  Filled 2017-03-09: qty 1

## 2017-03-09 MED ORDER — OXYCODONE-ACETAMINOPHEN 5-325 MG PO TABS: 1.0000 | ORAL_TABLET | ORAL | Status: DC | PRN

## 2017-03-09 SURGICAL SUPPLY — 26 items
BAG URO CATCHER STRL LF (MISCELLANEOUS) ×3 IMPLANT
BASKET LASER NITINOL 1.9FR (BASKET) ×3 IMPLANT
CATH INTERMIT  6FR 70CM (CATHETERS) ×3 IMPLANT
CLOTH BEACON ORANGE TIMEOUT ST (SAFETY) ×3 IMPLANT
COVER FOOT SWITCH UNIV DISP (DRAPES) IMPLANT
COVER SURGICAL LIGHT HANDLE (MISCELLANEOUS) ×3 IMPLANT
FIBER LASER FLEXIVA 1000 (UROLOGICAL SUPPLIES) IMPLANT
FIBER LASER FLEXIVA 365 (UROLOGICAL SUPPLIES) IMPLANT
FIBER LASER FLEXIVA 550 (UROLOGICAL SUPPLIES) IMPLANT
FIBER LASER TRAC TIP (UROLOGICAL SUPPLIES) ×3 IMPLANT
GLOVE BIOGEL M STRL SZ7.5 (GLOVE) ×3 IMPLANT
GOWN STRL REUS W/TWL LRG LVL3 (GOWN DISPOSABLE) ×6 IMPLANT
GUIDEWIRE ANG ZIPWIRE 038X150 (WIRE) ×3 IMPLANT
GUIDEWIRE STR DUAL SENSOR (WIRE) ×3 IMPLANT
IV NS 1000ML (IV SOLUTION) ×2
IV NS 1000ML BAXH (IV SOLUTION) ×1 IMPLANT
MANIFOLD NEPTUNE II (INSTRUMENTS) ×3 IMPLANT
PACK CYSTO (CUSTOM PROCEDURE TRAY) ×3 IMPLANT
SHEATH ACCESS URETERAL 24CM (SHEATH) IMPLANT
SHEATH ACCESS URETERAL 38CM (SHEATH) ×3 IMPLANT
SHEATH ACCESS URETERAL 54CM (SHEATH) IMPLANT
STENT POLARIS 5FRX26 (STENTS) ×3 IMPLANT
SYR CONTROL 10ML LL (SYRINGE) ×3 IMPLANT
TUBE FEEDING 8FR 16IN STR KANG (MISCELLANEOUS) ×3 IMPLANT
TUBING CONNECTING 10 (TUBING) ×2 IMPLANT
TUBING CONNECTING 10' (TUBING) ×1

## 2017-03-09 NOTE — Transfer of Care (Signed)
Immediate Anesthesia Transfer of Care Note  Patient: [name] [name]  Procedure(s) Performed: Procedure(s): CYSTOSCOPY WITH RETROGRADE PYELOGRAM, URETEROSCOPY AND STENT PLACEMENT (Left) HOLMIUM LASER APPLICATION (Left)  Patient Location: PACU  Anesthesia Type:General  Level of Consciousness:  sedated, patient cooperative and responds to stimulation  Airway & Oxygen Therapy:Patient Spontanous Breathing and Patient connected to face mask oxgen  Post-op Assessment:  Report given to PACU RN and Post -op Vital signs reviewed and stable  Post vital signs:  Reviewed and stable  Last Vitals:  Vitals:   03/09/17 1000 03/09/17 1030  BP: (!) 169/88 (!) 167/97  Pulse: 87 87  Resp: 18 20  Temp:    SpO2: 97% 99%    Complications: No apparent anesthesia complications

## 2017-03-09 NOTE — Interval H&P Note (Signed)
History and Physical Interval Note:  03/09/2017 11:06 AM  [name] [name]  has presented today for surgery, with the diagnosis of Stone extraction  The various methods of treatment have been discussed with the patient and family. After consideration of risks, benefits and other options for treatment, the patient has consented to  Procedure(s): CYSTOSCOPY WITH RETROGRADE PYELOGRAM, URETEROSCOPY AND STENT PLACEMENT (Left) as a surgical intervention .  The patient's history has been reviewed, patient examined, no change in status, stable for surgery.  I have reviewed the patient's chart and labs.  Questions were answered to the patient's satisfaction.     [name] [name]

## 2017-03-09 NOTE — ED Provider Notes (Signed)
[facility]-EMERGENCY DEPT Provider Note   CSN: [id number] Arrival date & time: 03/08/17  2001     History   Chief Complaint Chief Complaint  Patient presents with  . Flank Pain    HPI [name] [name] is a 71 y.o. male.  The history is provided by the patient.  Flank Pain  This is a recurrent problem. The current episode started 12 to 24 hours ago. The problem occurs constantly. The problem has not changed since onset.Associated symptoms include abdominal pain. Pertinent negatives include no chest pain, no headaches and no shortness of breath. Associated symptoms comments: LLQ. Nothing aggravates the symptoms. Nothing relieves the symptoms. Treatments tried: percocet flomax. The treatment provided no relief.  Has a h/o stones and called [facility] as he felt this was typical of his stone pain.  He is now scheduled for Monday afternoon. Began having uncontrolled pain and dry heaving.  No f/c/r.    Past Medical History:  Diagnosis Date  . CAD (coronary artery disease), native coronary artery 09/12/2015  . Chronic back pain    herniated disc  . CKD (chronic kidney disease), stage III   . Diverticulosis   . GERD (gastroesophageal reflux disease)    takes Omeprazole daily  . History of gout    takes Allopurinol daily  . History of kidney stones   . History of seasonal allergies    takes Loratadine daily  . Hyperlipidemia    takes Crestor daily  . Hypertension    takes Norvasc and Micardis daily  . Joint pain   . Kidney stones   . Muscle cramps    takes KDUr and Mag Ox as a trial to see if it will help  . Nocturia   . Pneumonia    history of double 30+yrs ago  . 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 Abnormal serum enzyme level 01/19/2016  . Actinic keratosis 01/19/2016  . Allergic rhinitis 01/19/2016  . Arthropathy 01/19/2016  . Chronic rhinitis 01/19/2016  . Cough  01/19/2016  . Cramp and spasm 01/19/2016  . Dorsalgia 01/19/2016  . Encounter for general adult medical examination with abnormal findings 01/19/2016  . Encounter for general adult medical examination without abnormal findings 01/19/2016  . Enlarged prostate without lower urinary tract symptoms (luts) 01/19/2016  . Enthesopathy 01/19/2016  . Essential (primary) hypertension 01/19/2016  . Gastro-esophageal reflux disease without esophagitis 01/19/2016  . Gout 01/19/2016  . Idiopathic gout 01/19/2016  . Iron deficiency anemia 01/19/2016  . Myositis 01/19/2016  . Other long term (current) drug therapy 01/19/2016  . Obesity 01/19/2016  . Pain of hand 01/19/2016  . Radiculopathy of thoracolumbar region 01/19/2016  . Retention of urine 01/19/2016  . Sciatica 01/19/2016  . Trigger finger 01/19/2016  . CKD (chronic kidney disease), stage III   . 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 Dyslipidemia 09/12/2015  . CAD (coronary artery disease), native coronary artery 09/12/2015    Past Surgical History:  Procedure Laterality Date  . BACK SURGERY    . CARDIAC CATHETERIZATION N/A 10/28/2015   Procedure: Left Heart Cath and Coronary Angiography;  Surgeon: [name], MD;  Location: [facility] INVASIVE CV LAB;  Service: Cardiovascular;  Laterality: N/A;  . COLONOSCOPY    . CORONARY ARTERY BYPASS GRAFT N/A 11/04/2015   Procedure: CORONARY ARTERY BYPASS GRAFTING (CABG) times three using left internal mammary  artery and right saphenous vein harvested with endoscope.;  Surgeon: [name], MD;  Location: [facility] OR;  Service: Open Heart Surgery;  Laterality: N/A;  . CYSTOSCOPY  1993  . CYSTOSCOPY WITH URETEROSCOPY  07/28/2012   Procedure: CYSTOSCOPY WITH URETEROSCOPY;  Surgeon: [name], MD;  Location: [facility] ORS;  Service: Urology;  Laterality: Left;     . ESOPHAGOGASTRODUODENOSCOPY     with diliation  . FOOT SURGERY  29yrs ago   right-removed bone spur   . HOLMIUM LASER APPLICATION  07/28/2012   Procedure: HOLMIUM  LASER APPLICATION;  Surgeon: [name], MD;  Location: [facility] ORS;  Service: Urology;  Laterality: Left;  left lithotripsy  . LITHOTRIPSY     x 2  . LUMBAR LAMINECTOMY  03-07-2012   with fusion  . TEE WITHOUT CARDIOVERSION N/A 11/04/2015   Procedure: TRANSESOPHAGEAL ECHOCARDIOGRAM (TEE);  Surgeon: [name], MD;  Location: [facility];  Service: Open Heart Surgery;  Laterality: N/A;  . TONSILLECTOMY    . VASECTOMY  50yrs ago       Home Medications    Prior to Admission medications   Medication Sig Start Date End Date Taking? Authorizing Provider  allopurinol (ZYLOPRIM) 100 MG tablet Take 100 mg by mouth 2 (two) times daily.    Yes [provider]  aspirin EC 81 MG EC tablet Take 1 tablet (81 mg total) by mouth daily. 01/19/16  Yes Turner, Cornelious Bryant, MD  cholecalciferol (VITAMIN D) 1000 UNITS tablet Take 1,000 Units by mouth daily.   Yes [provider]  Coenzyme Q10 (COQ10) 100 MG CAPS Take 1 capsule by mouth daily.    Yes [provider]  ezetimibe (ZETIA) 10 MG tablet Take 1 tablet (10 mg total) by mouth daily. 07/23/16  Yes Turner, Cornelious Bryant, MD  finasteride (PROSCAR) 5 MG tablet TK 1 T PO QD 03/04/17  Yes [provider]  metoprolol tartrate (LOPRESSOR) 25 MG tablet Take 1 tablet (25 mg total) by mouth 2 (two) times daily. 10/31/16  Yes Leone Brand, NP  Multiple Vitamin (MULTIVITAMIN WITH MINERALS) TABS Take 1 tablet by mouth daily.   Yes [provider]  pravastatin (PRAVACHOL) 40 MG tablet TK 1 T PO QPM 01/26/17  Yes [provider]  Tamsulosin HCl (FLOMAX) 0.4 MG CAPS Take 0.4 mg by mouth daily after supper.   Yes [provider]  telmisartan (MICARDIS) 20 MG tablet Take 1 tablet (20 mg total) by mouth every morning. 11/23/15  Yes Leone Brand, NP  fexofenadine (ALLEGRA) 180 MG tablet Take 180 mg by mouth daily as needed. (allergies) 02/11/15   [provider]  pravastatin (PRAVACHOL) 40 MG tablet Take 1 tablet (40 mg  total) by mouth every evening. 07/27/16 10/25/16  Quintella Reichert, MD    Family History Family History  Problem Relation Age of Onset  . Dementia Mother   . Prostate cancer Father     Social History Social History  Substance Use Topics  . Smoking status: Never Smoker  . Smokeless tobacco: Never Used  . Alcohol use No     Allergies   Atorvastatin; Hctz [hydrochlorothiazide]; Rosuvastatin; and Other   Review of Systems Review of Systems  Constitutional: Negative for fever.  Respiratory: Negative for shortness of breath.   Cardiovascular: Negative for chest pain.  Gastrointestinal: Positive for abdominal pain and nausea.  Genitourinary: Positive for flank pain. Negative for dysuria and penile pain.  Neurological: Negative for headaches.  All other systems reviewed and  are negative.    Physical Exam Updated Vital Signs BP (!) 171/98   Pulse 93   Temp 98.3 F (36.8 C) (Oral)   Resp 18   SpO2 93%   Physical Exam  Constitutional: He is oriented to person, place, and time. He appears well-developed and well-nourished. No distress.  HENT:  Head: Normocephalic and atraumatic.  Mouth/Throat: No oropharyngeal exudate.  Eyes: Pupils are equal, round, and reactive to light. Conjunctivae are normal.  Neck: Normal range of motion. Neck supple.  Cardiovascular: Normal rate, regular rhythm, normal heart sounds and intact distal pulses.   Pulmonary/Chest: Effort normal and breath sounds normal. He has no wheezes. He has no rales.  Abdominal: Soft. Bowel sounds are normal. He exhibits no mass. There is no tenderness. There is no rebound and no guarding.  Musculoskeletal: Normal range of motion.  Neurological: He is alert and oriented to person, place, and time. He displays normal reflexes.  Skin: Skin is warm and dry. Capillary refill takes less than 2 seconds.  Psychiatric: He has a normal mood and affect.     ED Treatments / Results   Vitals:   03/08/17 2028 03/09/17 0601    BP: (!) 158/99 (!) 171/98  Pulse: 81 93  Resp: 20 18  Temp: 98.3 F (36.8 C)   SpO2: 100% 93%    Labs (all labs ordered are listed, but only abnormal results are displayed)  Results for orders placed or performed during the hospital encounter of 03/09/17  Urinalysis, Routine w reflex microscopic- may I&O cath if menses  Result Value Ref Range   Color, Urine YELLOW YELLOW   APPearance CLEAR CLEAR   Specific Gravity, Urine 1.030 1.005 - 1.030   pH 5.5 5.0 - 8.0   Glucose, UA NEGATIVE NEGATIVE mg/dL   Hgb urine dipstick MODERATE (A) NEGATIVE   Bilirubin Urine NEGATIVE NEGATIVE   Ketones, ur NEGATIVE NEGATIVE mg/dL   Protein, ur NEGATIVE NEGATIVE mg/dL   Nitrite NEGATIVE NEGATIVE   Leukocytes, UA NEGATIVE NEGATIVE   RBC / HPF TOO NUMEROUS TO COUNT 0 - 5 RBC/hpf   WBC, UA 0-5 0 - 5 WBC/hpf   Bacteria, UA NONE SEEN NONE SEEN   Squamous Epithelial / LPF NONE SEEN NONE SEEN   Mucus PRESENT   CBC with Differential/Platelet  Result Value Ref Range   WBC 13.0 (H) 4.0 - 10.5 K/uL   RBC 4.76 4.22 - 5.81 MIL/uL   Hemoglobin 13.0 13.0 - 17.0 g/dL   HCT 16.1 09.6 - 04.5 %   MCV 83.2 78.0 - 100.0 fL   MCH 27.3 26.0 - 34.0 pg   MCHC 32.8 30.0 - 36.0 g/dL   RDW 40.9 (H) 81.1 - 91.4 %   Platelets 145 (L) 150 - 400 K/uL   Neutrophils Relative % 72 %   Neutro Abs 9.3 (H) 1.7 - 7.7 K/uL   Lymphocytes Relative 20 %   Lymphs Abs 2.6 0.7 - 4.0 K/uL   Monocytes Relative 7 %   Monocytes Absolute 1.0 0.1 - 1.0 K/uL   Eosinophils Relative 1 %   Eosinophils Absolute 0.1 0.0 - 0.7 K/uL   Basophils Relative 0 %   Basophils Absolute 0.0 0.0 - 0.1 K/uL  I-Stat Chem 8, ED  Result Value Ref Range   Sodium 141 135 - 145 mmol/L   Potassium 3.9 3.5 - 5.1 mmol/L   Chloride 105 101 - 111 mmol/L   BUN 23 (H) 6 - 20 mg/dL   Creatinine, Ser 7.82 (H)  0.61 - 1.24 mg/dL   Glucose, Bld 413 (H) 65 - 99 mg/dL   Calcium, Ion 2.44 0.10 - 1.40 mmol/L   TCO2 25 22 - 32 mmol/L   Hemoglobin 13.9 13.0 - 17.0  g/dL   HCT 27.2 53.6 - 64.4 %   Ct Renal Stone Study  Result Date: 03/09/2017 CLINICAL DATA:  71 year old male with severe left flank pain, frequency. EXAM: CT ABDOMEN AND PELVIS WITHOUT CONTRAST TECHNIQUE: Multidetector CT imaging of the abdomen and pelvis was performed following the standard protocol without IV contrast. COMPARISON:  CT Abdomen and Pelvis 05/21/2012. FINDINGS: Lower chest: Chronic elevation of the right hemidiaphragm. Right greater than left lung base atelectasis/ scarring. Prior sternotomy. No pericardial or pleural effusion. Hepatobiliary: Negative noncontrast liver. Evidence of tiny gallstones. No gallbladder inflammation or biliary ductal enlargement. Pancreas: Negative. Spleen: Negative. Adrenals/Urinary Tract: Normal adrenal glands. Chronic simple fluid density exophytic right renal upper pole cyst. Numerous calculi within the right kidney, most 2-3 mm. The largest is 8 mm at the lower pole. No right hydronephrosis. Negative course of the right ureter. Left intrarenal calculi, most 2 mm are smaller although there is an 8-9 mm midpole calculus. Moderate left hydronephrosis and perinephric stranding/ fluid. Left hydroureter to the ureterovesical junction where a 5-6 mm calculus is lodged (series 2, image 83 and sagittal image 111). Otherwise unremarkable urinary bladder. Stomach/Bowel: Decompressed rectosigmoid colon. Sigmoid redundancy with mild diverticulosis. Mild diverticulosis of the left colon which is decompressed. Mostly decompressed and negative transverse colon. Redundant hepatic flexure. Negative cecum and appendix. Negative terminal ileum. No dilated small bowel. Negative stomach and duodenum. No abdominal free fluid. Vascular/Lymphatic: Aortoiliac calcified atherosclerosis. Vascular patency is not evaluated in the absence of IV contrast. No lymphadenopathy. Reproductive: Negative. Other: No pelvic free fluid. Musculoskeletal: Chronic lower lumbar fusion. Adjacent segment disc  and facet degeneration. No acute osseous abnormality identified. IMPRESSION: 1. Acute obstructive uropathy on the left with a 6 mm calculus at the left UVJ. Probable forniceal rupture. 2. Extensive bilateral nephrolithiasis. 3. Cholelithiasis. 4. Left colon diverticulosis. 5.  Aortic Atherosclerosis (ICD10-I70.0). Electronically Signed   By: Odessa Fleming M.D.   On: 03/09/2017 03:29    Radiology Ct Renal Stone Study  Result Date: 03/09/2017 CLINICAL DATA:  71 year old male with severe left flank pain, frequency. EXAM: CT ABDOMEN AND PELVIS WITHOUT CONTRAST TECHNIQUE: Multidetector CT imaging of the abdomen and pelvis was performed following the standard protocol without IV contrast. COMPARISON:  CT Abdomen and Pelvis 05/21/2012. FINDINGS: Lower chest: Chronic elevation of the right hemidiaphragm. Right greater than left lung base atelectasis/ scarring. Prior sternotomy. No pericardial or pleural effusion. Hepatobiliary: Negative noncontrast liver. Evidence of tiny gallstones. No gallbladder inflammation or biliary ductal enlargement. Pancreas: Negative. Spleen: Negative. Adrenals/Urinary Tract: Normal adrenal glands. Chronic simple fluid density exophytic right renal upper pole cyst. Numerous calculi within the right kidney, most 2-3 mm. The largest is 8 mm at the lower pole. No right hydronephrosis. Negative course of the right ureter. Left intrarenal calculi, most 2 mm are smaller although there is an 8-9 mm midpole calculus. Moderate left hydronephrosis and perinephric stranding/ fluid. Left hydroureter to the ureterovesical junction where a 5-6 mm calculus is lodged (series 2, image 83 and sagittal image 111). Otherwise unremarkable urinary bladder. Stomach/Bowel: Decompressed rectosigmoid colon. Sigmoid redundancy with mild diverticulosis. Mild diverticulosis of the left colon which is decompressed. Mostly decompressed and negative transverse colon. Redundant hepatic flexure. Negative cecum and appendix.  Negative terminal ileum. No dilated small bowel. Negative stomach and  duodenum. No abdominal free fluid. Vascular/Lymphatic: Aortoiliac calcified atherosclerosis. Vascular patency is not evaluated in the absence of IV contrast. No lymphadenopathy. Reproductive: Negative. Other: No pelvic free fluid. Musculoskeletal: Chronic lower lumbar fusion. Adjacent segment disc and facet degeneration. No acute osseous abnormality identified. IMPRESSION: 1. Acute obstructive uropathy on the left with a 6 mm calculus at the left UVJ. Probable forniceal rupture. 2. Extensive bilateral nephrolithiasis. 3. Cholelithiasis. 4. Left colon diverticulosis. 5.  Aortic Atherosclerosis (ICD10-I70.0). Electronically Signed   By: Odessa Fleming M.D.   On: 03/09/2017 03:29    Procedures Procedures (including critical care time)  Medications Ordered in ED Medications  oxyCODONE-acetaminophen (PERCOCET/ROXICET) 5-325 MG per tablet 1 tablet (1 tablet Oral Given 03/08/17 2157)  0.9 %  sodium chloride infusion (not administered)  ondansetron (ZOFRAN-ODT) disintegrating tablet 4 mg (4 mg Oral Given 03/08/17 2157)  ondansetron (ZOFRAN) injection 4 mg (4 mg Intravenous Given 03/09/17 0551)  fentaNYL (SUBLIMAZE) injection 100 mcg (100 mcg Intravenous Given 03/09/17 0551)     Case d/w Dr. Sherron Monday, who will see the patient  Final Clinical Impressions(s) / ED Diagnoses   Final diagnoses:  Ureteral colic   Given increase in renal function the patient cannot receive toradol.  Will need to be seen for forniceal rupture and UPJ stone with increased creatinine.         Marcedes Tech, MD 03/09/17 1610

## 2017-03-09 NOTE — ED Notes (Signed)
Consent signed.

## 2017-03-09 NOTE — Consult Note (Signed)
Urology Consult  Referring physician: A Palumbo Reason for referral: ureter stone  Chief Complaint: ureter stone  History of Present Illness: left side pain; pain has been present for 1-2 days. Pain not settling down the emergency department.  Pain x 24 hours left flank; no nausea or fever; on flomax and recently finasteride. Hourly frequency is chronic. Has had multiple lithotripsies and a right flank incision for stone years ago. May have had one ureteroscopy after lithotripsy in the past. Has passed a number stones. Was to see Dr. Alvester Morin Monday  Cr increased to 2 from baseline 1.4  CT scan:   1. Acute obstructive uropathy on the left with a 6 mm calculus at the left UVJ. Probable forniceal rupture. Moderate hydro and 9 mm left kidney 2. Extensive bilateral nephrolithiasis.  Modifying factors: There are no other modifying factors  Associated signs and symptoms: There are no other associated signs and symptoms Aggravating and relieving factors: There are no other aggravating or relieving factors Severity: Moderate Duration: Persistent  Past Medical History:  Diagnosis Date  . CAD (coronary artery disease), native coronary artery 09/12/2015  . Chronic back pain    herniated disc  . CKD (chronic kidney disease), stage III   . Diverticulosis   . GERD (gastroesophageal reflux disease)    takes Omeprazole daily  . History of gout    takes Allopurinol daily  . History of kidney stones   . History of seasonal allergies    takes Loratadine daily  . Hyperlipidemia    takes Crestor daily  . Hypertension    takes Norvasc and Micardis daily  . Joint pain   . Kidney stones   . Muscle cramps    takes KDUr and Mag Ox as a trial to see if it will help  . Nocturia   . Pneumonia    history of double 30+yrs ago  . 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 BACK SURGERY    . CARDIAC  CATHETERIZATION N/A 10/28/2015   Procedure: Left Heart Cath and Coronary Angiography;  Surgeon: [name], MD;  Location: [facility] INVASIVE CV LAB;  Service: Cardiovascular;  Laterality: N/A;  . COLONOSCOPY    . CORONARY ARTERY BYPASS GRAFT N/A 11/04/2015   Procedure: CORONARY ARTERY BYPASS GRAFTING (CABG) times three using left internal mammary artery and right saphenous vein harvested with endoscope.;  Surgeon: [name], MD;  Location: [facility] OR;  Service: Open Heart Surgery;  Laterality: N/A;  . CYSTOSCOPY  1993  . CYSTOSCOPY WITH URETEROSCOPY  07/28/2012   Procedure: CYSTOSCOPY WITH URETEROSCOPY;  Surgeon: [name], MD;  Location: [facility] ORS;  Service: Urology;  Laterality: Left;     . ESOPHAGOGASTRODUODENOSCOPY     with diliation  . FOOT SURGERY  50yrs ago   right-removed bone spur   . HOLMIUM LASER APPLICATION  07/28/2012   Procedure: HOLMIUM LASER APPLICATION;  Surgeon: [name], MD;  Location: [facility] ORS;  Service: Urology;  Laterality: Left;  left lithotripsy  . LITHOTRIPSY     x 2  . LUMBAR LAMINECTOMY  03-07-2012   with fusion  . TEE WITHOUT CARDIOVERSION N/A 11/04/2015   Procedure: TRANSESOPHAGEAL ECHOCARDIOGRAM (TEE);  Surgeon: [name], MD;  Location: [facility] OR;  Service: Open Heart Surgery;  Laterality: N/A;  . TONSILLECTOMY    . VASECTOMY  47yrs ago    Medications: I have  reviewed the patient's current medications. Allergies:  Allergies  Allergen Reactions  . Atorvastatin Other (See Comments)    Reaction: dizziness/ dyspepsia. Muscle cramps all over body.  . Hctz [Hydrochlorothiazide] Other (See Comments)    Reaction: arthralgias/ skin burning  . Rosuvastatin Other (See Comments)    Reaction: muscle cramping  . Other Other (See Comments)    Family History  Problem Relation Age of Onset  . Dementia Mother   . Prostate cancer Father    Social History:  reports that he has never smoked. He has never used smokeless tobacco. He reports that he does not drink alcohol or  use drugs.  ROS: All systems are reviewed and negative except as noted. Rest negative  Physical Exam:  Vital signs in last 24 hours: Temp:  [98.3 F (36.8 C)] 98.3 F (36.8 C) (08/24 2028) Pulse Rate:  [81-100] 100 (08/25 0711) Resp:  [18-20] 18 (08/25 0711) BP: (158-171)/(94-99) 171/94 (08/25 0711) SpO2:  [92 %-100 %] 92 % (08/25 0711)  Cardiovascular: Skin warm; not flushed Respiratory: Breaths quiet; no shortness of breath Abdomen: No masses Neurological: Normal sensation to touch Musculoskeletal: Normal motor function arms and legs Lymphatics: No inguinal adenopathy Skin: No rashes Genitourinary:non toxic and non tender  Laboratory Data:  Results for orders placed or performed during the hospital encounter of 03/09/17 (from the past 72 hour(s))  Urinalysis, Routine w reflex microscopic- may I&O cath if menses     Status: Abnormal   Collection Time: 03/08/17  9:54 PM  Result Value Ref Range   Color, Urine YELLOW YELLOW   APPearance CLEAR CLEAR   Specific Gravity, Urine 1.030 1.005 - 1.030   pH 5.5 5.0 - 8.0   Glucose, UA NEGATIVE NEGATIVE mg/dL   Hgb urine dipstick MODERATE (A) NEGATIVE   Bilirubin Urine NEGATIVE NEGATIVE   Ketones, ur NEGATIVE NEGATIVE mg/dL   Protein, ur NEGATIVE NEGATIVE mg/dL   Nitrite NEGATIVE NEGATIVE   Leukocytes, UA NEGATIVE NEGATIVE   RBC / HPF TOO NUMEROUS TO COUNT 0 - 5 RBC/hpf   WBC, UA 0-5 0 - 5 WBC/hpf   Bacteria, UA NONE SEEN NONE SEEN   Squamous Epithelial / LPF NONE SEEN NONE SEEN   Mucus PRESENT   CBC with Differential/Platelet     Status: Abnormal   Collection Time: 03/09/17  5:39 AM  Result Value Ref Range   WBC 13.0 (H) 4.0 - 10.5 K/uL   RBC 4.76 4.22 - 5.81 MIL/uL   Hemoglobin 13.0 13.0 - 17.0 g/dL   HCT 16.1 09.6 - 04.5 %   MCV 83.2 78.0 - 100.0 fL   MCH 27.3 26.0 - 34.0 pg   MCHC 32.8 30.0 - 36.0 g/dL   RDW 40.9 (H) 81.1 - 91.4 %   Platelets 145 (L) 150 - 400 K/uL   Neutrophils Relative % 72 %   Neutro Abs 9.3 (H)  1.7 - 7.7 K/uL   Lymphocytes Relative 20 %   Lymphs Abs 2.6 0.7 - 4.0 K/uL   Monocytes Relative 7 %   Monocytes Absolute 1.0 0.1 - 1.0 K/uL   Eosinophils Relative 1 %   Eosinophils Absolute 0.1 0.0 - 0.7 K/uL   Basophils Relative 0 %   Basophils Absolute 0.0 0.0 - 0.1 K/uL  I-Stat Chem 8, ED     Status: Abnormal   Collection Time: 03/09/17  6:06 AM  Result Value Ref Range   Sodium 141 135 - 145 mmol/L   Potassium 3.9 3.5 - 5.1 mmol/L  Chloride 105 101 - 111 mmol/L   BUN 23 (H) 6 - 20 mg/dL   Creatinine, Ser 0.03 (H) 0.61 - 1.24 mg/dL   Glucose, Bld 491 (H) 65 - 99 mg/dL   Calcium, Ion 7.91 5.05 - 1.40 mmol/L   TCO2 25 22 - 32 mmol/L   Hemoglobin 13.9 13.0 - 17.0 g/dL   HCT 69.7 94.8 - 01.6 %   No results found for this or any previous visit (from the past 240 hour(s)). Creatinine:  Recent Labs  03/09/17 0606  CREATININE 2.10*    Xrays: See report/chart As noted  Impression/Assessment:  Picture drawn. Distal left ureteral stone with stone burden left kidney. Discussed watchful waiting and future ureteroscopy first ureteroscopy today. Briefly mention lithotripsy. Talked about stents after procedure or if needed for inability to perform ureteroscopy. Pros cons and risks discussed and sequelae.  Plan:  Consent signed. Patient would like to proceed with ureteroscopy of one or both stones. He does have a history of moderate pain with urination with a stent.  After a thorough review of the management options for the patient's condition the patient  elected to proceed with surgical therapy as noted above. We have discussed the potential benefits and risks of the procedure, side effects of the proposed treatment, the likelihood of the patient achieving the goals of the procedure, and any potential problems that might occur during the procedure or recuperation. Informed consent has been obtained.  Jaryd Drew A 03/09/2017, 7:31 AM

## 2017-03-09 NOTE — Anesthesia Preprocedure Evaluation (Signed)
Anesthesia Evaluation  Patient identified by MRN, date of birth, ID band Patient awake    Reviewed: Allergy & Precautions, NPO status , Patient's Chart, lab work & pertinent test results  History of Anesthesia Complications Negative for: history of anesthetic complications  Airway Mallampati: II  TM Distance: >3 FB Neck ROM: Full    Dental  (+) Teeth Intact, Dental Advisory Given   Pulmonary neg pulmonary ROS,    Pulmonary exam normal breath sounds clear to auscultation       Cardiovascular hypertension, Pt. on medications and Pt. on home beta blockers + CAD  Normal cardiovascular exam Rhythm:Regular Rate:Normal  LHC 10/28/15: Severe diffuse left coronary calcification. 50% eccentric ostial left main and 60% distal left main. Diffuse greater than 80% proximal to mid LAD. Heavily calcified eccentric 80-90% stenosis in the ostial to proximal circumflex. 40% proximal RCA Low normal LV systolic function with normal LVEDP   Neuro/Psych negative neurological ROS  negative psych ROS   GI/Hepatic Neg liver ROS, GERD  Medicated,  Endo/Other  negative endocrine ROS  Renal/GU Renal InsufficiencyRenal diseasenegative Renal ROS     Musculoskeletal Herniated lumbar disc    Abdominal   Peds  Hematology  (+) Blood dyscrasia, anemia ,   Anesthesia Other Findings Day of surgery medications reviewed with the patient.  Reproductive/Obstetrics                             Anesthesia Physical  Anesthesia Plan  ASA: III  Anesthesia Plan: General   Post-op Pain Management:    Induction: Intravenous  PONV Risk Score and Plan: 2 and Ondansetron and Dexamethasone  Airway Management Planned: LMA  Additional Equipment:   Intra-op Plan:   Post-operative Plan: Post-operative intubation/ventilation  Informed Consent: I have reviewed the patients History and Physical, chart, labs and discussed  the procedure including the risks, benefits and alternatives for the proposed anesthesia with the patient or authorized representative who has indicated his/her understanding and acceptance.   Dental advisory given  Plan Discussed with: CRNA, Anesthesiologist and Surgeon  Anesthesia Plan Comments:         Anesthesia Quick Evaluation

## 2017-03-09 NOTE — Anesthesia Postprocedure Evaluation (Signed)
Anesthesia Post Note  Patient: [name] [name]  Procedure(s) Performed: Procedure(s) (LRB): CYSTOSCOPY WITH RETROGRADE PYELOGRAM, URETEROSCOPY AND STENT PLACEMENT (Left) HOLMIUM LASER APPLICATION (Left)     Patient location during evaluation: PACU Anesthesia Type: General Level of consciousness: sedated Pain management: pain level controlled Vital Signs Assessment: post-procedure vital signs reviewed and stable Respiratory status: spontaneous breathing and respiratory function stable Cardiovascular status: stable Anesthetic complications: no    Last Vitals:  Vitals:   03/09/17 1315 03/09/17 1335  BP: 135/79 (!) 157/83  Pulse: 86 94  Resp: 13 14  Temp: 36.8 C 36.5 C  SpO2: 95% 96%    Last Pain:  Vitals:   03/09/17 1315  TempSrc:   PainSc: 0-No pain                 Zhanae Proffit DANIEL

## 2017-03-09 NOTE — H&P (Signed)
[name] [name] is an 71 y.o. male.    Chief Complaint: Left Ureteral Stone  HPI:   1 - Recurrent Nephrolithiasis - long h/o recurrent stones. Pre 2018 - Rt pyelolithotomy, ESWL x several, Lt ureteroscopy 02/2017 - Left 6mm UVJ + bilateral non-obstructing renal stones by ER CT on eval left flank pain.   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 No recent 24 he urines for review. Prior Stone composition CaOx  3 - Acute on Chronic Renal Insufficiency - baseline Cr 1.4's with rise to 2.0 with new left hydro and obstruting stone as per above.   4- Enlarging Right Renal Cyst - Rt cyst noted on imaging x several, extreme upper pole. No dedicated contrast imaging. 2.4cm 2015, 3.6cm 2018, no mass effect.  Today "[name] [name]" is seen for acute right ureteral stone. His colic is refractory. NO fevers. UA without infectious parameters.   Past Medical History:  Diagnosis Date  . CAD (coronary artery disease), native coronary artery 09/12/2015  . Chronic back pain    herniated disc  . CKD (chronic kidney disease), stage III   . Diverticulosis   . GERD (gastroesophageal reflux disease)    takes Omeprazole daily  . History of gout    takes Allopurinol daily  . History of kidney stones   . History of seasonal allergies    takes Loratadine daily  . Hyperlipidemia    takes Crestor daily  . Hypertension    takes Norvasc and Micardis daily  . Joint pain   . Kidney stones   . Muscle cramps    takes KDUr and Mag Ox as a trial to see if it will help  . Nocturia   . Pneumonia    history of double 30+yrs ago  . 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 BACK SURGERY    . CARDIAC CATHETERIZATION N/A 10/28/2015   Procedure: Left Heart Cath and Coronary Angiography;  Surgeon: [name], MD;  Location: [facility] INVASIVE CV LAB;  Service: Cardiovascular;  Laterality: N/A;  . COLONOSCOPY    .  CORONARY ARTERY BYPASS GRAFT N/A 11/04/2015   Procedure: CORONARY ARTERY BYPASS GRAFTING (CABG) times three using left internal mammary artery and right saphenous vein harvested with endoscope.;  Surgeon: [name], MD;  Location: [facility] OR;  Service: Open Heart Surgery;  Laterality: N/A;  . CYSTOSCOPY  1993  . CYSTOSCOPY WITH URETEROSCOPY  07/28/2012   Procedure: CYSTOSCOPY WITH URETEROSCOPY;  Surgeon: [name], MD;  Location: [facility] ORS;  Service: Urology;  Laterality: Left;     . ESOPHAGOGASTRODUODENOSCOPY     with diliation  . FOOT SURGERY  23yrs ago   right-removed bone spur   . HOLMIUM LASER APPLICATION  07/28/2012   Procedure: HOLMIUM LASER APPLICATION;  Surgeon: [name], MD;  Location: [facility] ORS;  Service: Urology;  Laterality: Left;  left lithotripsy  . LITHOTRIPSY     x 2  . LUMBAR LAMINECTOMY  03-07-2012   with fusion  . TEE WITHOUT CARDIOVERSION N/A 11/04/2015   Procedure: TRANSESOPHAGEAL ECHOCARDIOGRAM (TEE);  Surgeon: [name], MD;  Location: [facility] OR;  Service: Open Heart Surgery;  Laterality: N/A;  . TONSILLECTOMY    . VASECTOMY  34yrs ago    Family History  Problem Relation Age of Onset  . Dementia Mother   . Prostate cancer Father  Social History:  reports that he has never smoked. He has never used smokeless tobacco. He reports that he does not drink alcohol or use drugs.  Allergies:  Allergies  Allergen Reactions  . Atorvastatin Other (See Comments)    Reaction: dizziness/ dyspepsia. Muscle cramps all over body.  . Hctz [Hydrochlorothiazide] Other (See Comments)    Reaction: arthralgias/ skin burning  . Rosuvastatin Other (See Comments)    Reaction: muscle cramping  . Other Other (See Comments)     (Not in a hospital admission)  Results for orders placed or performed during the hospital encounter of 03/09/17 (from the past 48 hour(s))  Urinalysis, Routine w reflex microscopic- may I&O cath if menses     Status: Abnormal   Collection Time: 03/08/17   9:54 PM  Result Value Ref Range   Color, Urine YELLOW YELLOW   APPearance CLEAR CLEAR   Specific Gravity, Urine 1.030 1.005 - 1.030   pH 5.5 5.0 - 8.0   Glucose, UA NEGATIVE NEGATIVE mg/dL   Hgb urine dipstick MODERATE (A) NEGATIVE   Bilirubin Urine NEGATIVE NEGATIVE   Ketones, ur NEGATIVE NEGATIVE mg/dL   Protein, ur NEGATIVE NEGATIVE mg/dL   Nitrite NEGATIVE NEGATIVE   Leukocytes, UA NEGATIVE NEGATIVE   RBC / HPF TOO NUMEROUS TO COUNT 0 - 5 RBC/hpf   WBC, UA 0-5 0 - 5 WBC/hpf   Bacteria, UA NONE SEEN NONE SEEN   Squamous Epithelial / LPF NONE SEEN NONE SEEN   Mucus PRESENT   CBC with Differential/Platelet     Status: Abnormal   Collection Time: 03/09/17  5:39 AM  Result Value Ref Range   WBC 13.0 (H) 4.0 - 10.5 K/uL   RBC 4.76 4.22 - 5.81 MIL/uL   Hemoglobin 13.0 13.0 - 17.0 g/dL   HCT 16.1 09.6 - 04.5 %   MCV 83.2 78.0 - 100.0 fL   MCH 27.3 26.0 - 34.0 pg   MCHC 32.8 30.0 - 36.0 g/dL   RDW 40.9 (H) 81.1 - 91.4 %   Platelets 145 (L) 150 - 400 K/uL   Neutrophils Relative % 72 %   Neutro Abs 9.3 (H) 1.7 - 7.7 K/uL   Lymphocytes Relative 20 %   Lymphs Abs 2.6 0.7 - 4.0 K/uL   Monocytes Relative 7 %   Monocytes Absolute 1.0 0.1 - 1.0 K/uL   Eosinophils Relative 1 %   Eosinophils Absolute 0.1 0.0 - 0.7 K/uL   Basophils Relative 0 %   Basophils Absolute 0.0 0.0 - 0.1 K/uL  I-Stat Chem 8, ED     Status: Abnormal   Collection Time: 03/09/17  6:06 AM  Result Value Ref Range   Sodium 141 135 - 145 mmol/L   Potassium 3.9 3.5 - 5.1 mmol/L   Chloride 105 101 - 111 mmol/L   BUN 23 (H) 6 - 20 mg/dL   Creatinine, Ser 7.82 (H) 0.61 - 1.24 mg/dL   Glucose, Bld 956 (H) 65 - 99 mg/dL   Calcium, Ion 2.13 0.86 - 1.40 mmol/L   TCO2 25 22 - 32 mmol/L   Hemoglobin 13.9 13.0 - 17.0 g/dL   HCT 57.8 46.9 - 62.9 %   Ct Renal Stone Study  Result Date: 03/09/2017 CLINICAL DATA:  71 year old male with severe left flank pain, frequency. EXAM: CT ABDOMEN AND PELVIS WITHOUT CONTRAST TECHNIQUE:  Multidetector CT imaging of the abdomen and pelvis was performed following the standard protocol without IV contrast. COMPARISON:  CT Abdomen and Pelvis 05/21/2012. FINDINGS: Lower chest:  Chronic elevation of the right hemidiaphragm. Right greater than left lung base atelectasis/ scarring. Prior sternotomy. No pericardial or pleural effusion. Hepatobiliary: Negative noncontrast liver. Evidence of tiny gallstones. No gallbladder inflammation or biliary ductal enlargement. Pancreas: Negative. Spleen: Negative. Adrenals/Urinary Tract: Normal adrenal glands. Chronic simple fluid density exophytic right renal upper pole cyst. Numerous calculi within the right kidney, most 2-3 mm. The largest is 8 mm at the lower pole. No right hydronephrosis. Negative course of the right ureter. Left intrarenal calculi, most 2 mm are smaller although there is an 8-9 mm midpole calculus. Moderate left hydronephrosis and perinephric stranding/ fluid. Left hydroureter to the ureterovesical junction where a 5-6 mm calculus is lodged (series 2, image 83 and sagittal image 111). Otherwise unremarkable urinary bladder. Stomach/Bowel: Decompressed rectosigmoid colon. Sigmoid redundancy with mild diverticulosis. Mild diverticulosis of the left colon which is decompressed. Mostly decompressed and negative transverse colon. Redundant hepatic flexure. Negative cecum and appendix. Negative terminal ileum. No dilated small bowel. Negative stomach and duodenum. No abdominal free fluid. Vascular/Lymphatic: Aortoiliac calcified atherosclerosis. Vascular patency is not evaluated in the absence of IV contrast. No lymphadenopathy. Reproductive: Negative. Other: No pelvic free fluid. Musculoskeletal: Chronic lower lumbar fusion. Adjacent segment disc and facet degeneration. No acute osseous abnormality identified. IMPRESSION: 1. Acute obstructive uropathy on the left with a 6 mm calculus at the left UVJ. Probable forniceal rupture. 2. Extensive bilateral  nephrolithiasis. 3. Cholelithiasis. 4. Left colon diverticulosis. 5.  Aortic Atherosclerosis (ICD10-I70.0). Electronically Signed   By: Odessa Fleming M.D.   On: 03/09/2017 03:29    Review of Systems  Constitutional: Negative.  Negative for chills and fever.  HENT: Negative.   Eyes: Negative.   Respiratory: Negative.   Cardiovascular: Negative.   Gastrointestinal: Negative.   Genitourinary: Positive for flank pain and urgency.  Skin: Negative.   Neurological: Negative.   Endo/Heme/Allergies: Negative.   Psychiatric/Behavioral: Negative.     Blood pressure (!) 169/88, pulse 87, temperature 98.4 F (36.9 C), temperature source Oral, resp. rate 18, height 5\' 6"  (1.676 m), weight 98 kg (216 lb), SpO2 97 %. Physical Exam  Constitutional: He appears well-developed.  HENT:  Head: Normocephalic.  Eyes: Pupils are equal, round, and reactive to light.  Neck: Normal range of motion.  Respiratory: Effort normal.  GI: Soft.  Genitourinary:  Genitourinary Comments: Moderate LEFT CVAT  Musculoskeletal: Normal range of motion.  Neurological: He is alert.  Skin: Skin is warm.  Psychiatric: He has a normal mood and affect.     Assessment/Plan  1 - Recurrent Nephrolithiasis - now with obstructing left distal stone. Options of medical therapy, SWL, URS discussed and he wants to proceed with left URS today with goal of left side stone free. Risks, benefits, alternatives, expected peri-op course discussed.   2 - Metabolic Stone Disease - would benefit from repeat metabolic eval in elective setting and possibly daily thiazide if hypercalciuria confirmed.   3 - Acute on Chronic Renal Insufficiency - plan for stone treatmetn / renal decompression as per above.    4- Enlarging Right Renal Cyst - rec contrast imaging at some pont in elective setting to help r/o cystic neoplasm.  Sebastian Ache, MD 03/09/2017, 10:22 AM

## 2017-03-09 NOTE — Anesthesia Preprocedure Evaluation (Signed)
Anesthesia Evaluation  Patient identified by MRN, date of birth, ID band Patient awake    Reviewed: Allergy & Precautions, NPO status , Patient's Chart, lab work & pertinent test results, reviewed documented beta blocker date and time   Airway Mallampati: II  TM Distance: >3 FB Neck ROM: Full    Dental  (+) Teeth Intact   Pulmonary    Pulmonary exam normal        Cardiovascular hypertension, + CAD  Normal cardiovascular exam     Neuro/Psych    GI/Hepatic GERD  ,  Endo/Other    Renal/GU      Musculoskeletal   Abdominal Normal abdominal exam  (+)   Peds  Hematology   Anesthesia Other Findings   Reproductive/Obstetrics                             Anesthesia Physical Anesthesia Plan  ASA: III and emergent  Anesthesia Plan: General   Post-op Pain Management:    Induction: Intravenous  PONV Risk Score and Plan: 2 and Ondansetron, Dexamethasone and Treatment may vary due to age or medical condition  Airway Management Planned: LMA  Additional Equipment:   Intra-op Plan:   Post-operative Plan: Extubation in OR  Informed Consent: I have reviewed the patients History and Physical, chart, labs and discussed the procedure including the risks, benefits and alternatives for the proposed anesthesia with the patient or authorized representative who has indicated his/her understanding and acceptance.   Dental advisory given  Plan Discussed with: CRNA, Anesthesiologist and Surgeon  Anesthesia Plan Comments:         Anesthesia Quick Evaluation

## 2017-03-09 NOTE — Brief Op Note (Signed)
03/09/2017  12:08 PM  PATIENT:  [name] [name]  71 y.o. male  PRE-OPERATIVE DIAGNOSIS:  Stone extraction  POST-OPERATIVE DIAGNOSIS:  left ureteral stent  PROCEDURE:  Procedure(s): CYSTOSCOPY WITH RETROGRADE PYELOGRAM, URETEROSCOPY AND STENT PLACEMENT (Left) HOLMIUM LASER APPLICATION (Left)  SURGEON:  Surgeon(s) and Role:    Sebastian Ache, MD - Primary  PHYSICIAN ASSISTANT:   ASSISTANTS: none   ANESTHESIA:   general  EBL:  Total I/O In: 1600 [I.V.:1600] Out: -   BLOOD ADMINISTERED:none  DRAINS: none   LOCAL MEDICATIONS USED:  NONE  SPECIMEN:  Source of Specimen:  left ureteral / renal stone fragments  DISPOSITION OF SPECIMEN:  [facility]  COUNTS:  YES  TOURNIQUET:  * No tourniquets in log *  DICTATION: .Other Dictation: Dictation Number [id number]  PLAN OF CARE: Admit for overnight observation  PATIENT DISPOSITION:  PACU - hemodynamically stable.   Delay start of Pharmacological VTE agent (>24hrs) due to surgical blood loss or risk of bleeding: yes

## 2017-03-10 ENCOUNTER — Encounter (HOSPITAL_COMMUNITY): Payer: Self-pay | Admitting: Urology

## 2017-03-10 LAB — BASIC METABOLIC PANEL WITH GFR
Anion gap: 8 (ref 5–15)
BUN: 22 mg/dL — ABNORMAL HIGH (ref 6–20)
CO2: 24 mmol/L (ref 22–32)
Calcium: 9 mg/dL (ref 8.9–10.3)
Chloride: 107 mmol/L (ref 101–111)
Creatinine, Ser: 1.91 mg/dL — ABNORMAL HIGH (ref 0.61–1.24)
GFR calc Af Amer: 39 mL/min — ABNORMAL LOW
GFR calc non Af Amer: 34 mL/min — ABNORMAL LOW
Glucose, Bld: 120 mg/dL — ABNORMAL HIGH (ref 65–99)
Potassium: 4 mmol/L (ref 3.5–5.1)
Sodium: 139 mmol/L (ref 135–145)

## 2017-03-10 MED ORDER — TRAMADOL HCL 50 MG PO TABS: 50.0000 mg | ORAL_TABLET | Freq: Four times a day (QID) | ORAL | 0 refills | Status: AC | PRN

## 2017-03-10 MED ORDER — SENNOSIDES-DOCUSATE SODIUM 8.6-50 MG PO TABS: 1.0000 | ORAL_TABLET | Freq: Two times a day (BID) | ORAL | 0 refills | Status: DC

## 2017-03-10 MED ORDER — CEPHALEXIN 500 MG PO CAPS: 500.0000 mg | ORAL_CAPSULE | Freq: Two times a day (BID) | ORAL | 0 refills | Status: AC

## 2017-03-10 NOTE — Discharge Instructions (Signed)
1 - You may have urinary urgency (bladder spasms) and bloody urine on / off with stent in place. This is normal.  2 - Remove tethered stent on Monday morning at home by pulling on string, then blue-white plastic tubing, and discarding.   3 - Call MD or go to ER for fever >102, severe pain / nausea / vomiting not relieved by medications, or acute change in medical status

## 2017-03-10 NOTE — Discharge Summary (Signed)
Physician Discharge Summary  Patient ID: [name] [name] MRN: [id number] DOB/AGE: [birth year] 71 y.o.  Admit date: 03/09/2017 Discharge date: 03/10/2017  Admission Diagnoses:  Discharge Diagnoses:  Active Problems:   Ureteral stone with hydronephrosis   Discharged Condition: good  Hospital Course:   1 - Recurrent Nephrolithiasis - pt underwent LEFT ureteroscopy / laser lithotripsy / tethered stent placemetn on 8/25, the day of admission, for 70mm left distal stone + left intrarenal stones to stone free. He was observed overnight. By POD 1, the day of discharge, he is ambulatory, pain controlled on PO meds, maintaining PO hydration and felt to be adequate for discharge. \  2 -  Acute on Chronic Renal Insufficiency - baseline Cr 1.4's with rise to 2.1 with new left hydro and obstruting stone as per above. Trending back towards normal at 1.9 at time of discharge.      Consults: None  Significant Diagnostic Studies: labs: as per above  Treatments: surgery: as per above.  Discharge Exam: Blood pressure 124/63, pulse 82, temperature 97.9 F (36.6 C), temperature source Oral, resp. rate 16, height 5\' 6"  (1.676 m), weight 98 kg (216 lb), SpO2 97 %. General appearance: alert, cooperative and appears stated age Eyes: negative Nose: Nares normal. Septum midline. Mucosa normal. No drainage or sinus tenderness. Throat: lips, mucosa, and tongue normal; teeth and gums normal Neck: supple, symmetrical, trachea midline Back: symmetric, no curvature. ROM normal. No CVA tenderness. Resp: non-labored on room air.  Cardio: Nl rate GI: soft, non-tender; bowel sounds normal; no masses,  no organomegaly Male genitalia: normal, tethered stent string in place.  Extremities: extremities normal, atraumatic, no cyanosis or edema Skin: Skin color, texture, turgor normal. No rashes or lesions Lymph nodes: Cervical, supraclavicular, and axillary nodes normal. Neurologic: Grossly normal  Disposition:  01-Home or Self Care    Follow-up Information    Crista Elliot, MD Follow up on 03/11/2017.   Specialty:  Urology Why:  at 3:45 as scheduled for MD visit.  Contact information: [address] [place] [address] [phone]           Signed: Sebastian Ache 03/10/2017, 8:32 AM

## 2017-03-10 NOTE — Op Note (Signed)
NAME:  [name] [name], [name] [name] NO.:  MEDICAL RECORD NO.:  [id number]  LOCATION:                                 FACILITY:  PHYSICIAN:  Sebastian Ache, MD     DATE OF BIRTH:  05/23/1946  DATE OF PROCEDURE: 03/09/2017                              OPERATIVE REPORT   PREOPERATIVE DIAGNOSES:  Left ureteral stone, refractory colic, acute renal failure.  PROCEDURE: 1. Cystoscopy with left retrograde pyelogram interpretation. 2. Left ureteroscopy with laser lithotripsy. 3. Insertion of left ureteral stent, 5 x 26 Polaris with tether.  ESTIMATED BLOOD LOSS:  Nil.  COMPLICATION:  None.  SPECIMEN:  Left ureteral and renal stone fragments to compositional analysis.  FINDINGS: 1. Left ureterovesical junction stone with moderate     hydroureteronephrosis. 2. Small volume, left intrarenal stone. 3. Complete resolution of all stone fragments larger than 1/3rd mm     following laser lithotripsy and basket extraction on the left side. 4. Successful placement of left ureteral stent, proximal in renal     pelvis and distal in the bladder.  INDICATION:  [name] [name] is a very pleasant, 71 year old gentleman with longstanding history of recurrent nephrolithiasis, status post multiple bilateral procedures.  He had acute onset of left colicky flank pain that became worse over the last week or so.  He has a pending urology appointment next week.  His pain, however, became refractory.  He was evaluated in the emergency room yesterday and was noted to have a left distal ureteral stone as well as left intrarenal stones.  There is rise in his creatinine up to 2 up from baseline 1.4.  Multiple rounds of pain meds were attempted, but the colic was refractory.  Consultation was sought.  Options were discussed including medical therapy alone versus shockwave lithotripsy versus ureteroscopy.  He wished to proceed with the latter with goal of left-sided stone free.  Informed consent  was obtained and placed in the medical record.  PROCEDURE IN DETAIL:  The patient being [name] [name] was verified. Procedure being left ureteroscopic stone manipulation was confirmed. Procedure was carried out.  Time-out was performed.  Intravenous antibiotics were administered.  General anesthesia was introduced.  The patient was placed into a low lithotomy position.  Sterile field was created by prepping and draping the patient's penis, perineum, proximal thighs using iodine.  Next, cystourethroscopy was performed using a 22- French rigid cystoscope with offset lens.  Anterior and posterior urethra unremarkable.  Inspection of bladder revealed no diverticula. There was a crowing stone at the left ureterovesical junction that with manipulation of the cystoscope was released into the urinary bladder. This was irrigated and set aside.  As the goal today was left-sided stone free and since the patient has hydronephrosis this is making this an advantageous time to perform left ureteroscopy, so decision was made to proceed with that.  The left ureteral orifice was cannulated with a 6- French end-hole catheter and left retrograde pyelogram was obtained.  Left ventriculogram demonstrated a single left ureter with single-system left kidney.  There was moderate hydronephrosis to the level of distal ureter.  A 0.038 ZIPwire was advanced  at the level of the upper pole, set aside as a safety wire.  An 8-French feeding tube placed in urinary bladder for pressure release.  Next, semi-rigid ureteroscopy was performed in the distal four-fifths of the left ureter alongside a separate Sensor working wire.  No mucosal abnormalities were found.  The semi-rigid scope was exchanged for a 12/14, 38-cm ureteral access sheath at the level of proximal ureter using continuous fluoroscopic guidance and flexible digital ureteroscopy performed in the left proximal left ureter and systematic inspection of the  kidney including all calices x3. There were 2 areas of papillary tip calcifications, 1 upper pole, 1 mid pole, and then a dominant stone in left lower pole.  The dominant stone appeared to be much too large for simple basketing.  As such, holmium laser energy applied to the stone using settings of 0.3 joules and 30 Hz.  The stone was fragmented into pieces, approximately 1 mm or less in diameter.  These were then sequentially grasped on the long axis, removed and set aside for compositional analysis.  The Escape basket was also used to dislodge and then removed the papillary tip calcifications as well on the left side.  Following these maneuvers, excellent hemostasis, complete resolution of all stone fragments, left side larger than 1/3rd mm.  Given interval access sheath usage and acute renal failure, it was felt that brief interval stenting would be warranted. The access sheath was removed under continuous vision.  No mucosal abnormalities were found.  Finally a new 5 x 26 Polaris-type stent was placed with remaining safety wire using fluoroscopic guidance.  Good proximal distal deployment was noted.  The tether was left in place and fashioned to the dorsum of the penis and the procedure was terminated. The patient tolerated the procedure well.  There were no immediate periprocedural complications.  The patient was taken to the postanesthesia care in stable condition with plan for observation admission and likely discharge tomorrow as long as renal function if improving.          ______________________________ Sebastian Ache, MD     TM/MEDQ  D:  03/09/2017  T:  03/09/2017  Job:  [id number]

## 2017-03-11 ENCOUNTER — Encounter (HOSPITAL_COMMUNITY): Payer: Self-pay | Admitting: Urology

## 2017-03-12 ENCOUNTER — Other Ambulatory Visit: Payer: Self-pay | Admitting: Urology

## 2017-03-20 ENCOUNTER — Encounter (HOSPITAL_BASED_OUTPATIENT_CLINIC_OR_DEPARTMENT_OTHER): Payer: Self-pay | Admitting: *Deleted

## 2017-03-20 NOTE — Progress Notes (Signed)
NPO AFTER MN.  ARRIVE AT 0715.  NEEDS EKG.  CURRENT LAB RESULTS IN CHART AND EPIC.  WILL TAKE AM MEDS W/ SIPS OF WATER AND IF NEEDED TAKE TRAMADOL .

## 2017-03-25 ENCOUNTER — Encounter (HOSPITAL_BASED_OUTPATIENT_CLINIC_OR_DEPARTMENT_OTHER)
Admission: RE | Disposition: A | Discharge status: Home / Self Care | Payer: Self-pay | Source: Ambulatory Visit | Attending: Urology

## 2017-03-25 ENCOUNTER — Ambulatory Visit (HOSPITAL_BASED_OUTPATIENT_CLINIC_OR_DEPARTMENT_OTHER)
Admission: RE | Admit: 2017-03-25 | Discharge: 2017-03-25 | Disposition: A | Discharge status: Home / Self Care | Payer: Medicare Other | Source: Ambulatory Visit | Attending: Urology | Admitting: Urology

## 2017-03-25 ENCOUNTER — Ambulatory Visit (HOSPITAL_BASED_OUTPATIENT_CLINIC_OR_DEPARTMENT_OTHER): Payer: Medicare Other | Admitting: Anesthesiology

## 2017-03-25 ENCOUNTER — Encounter (HOSPITAL_BASED_OUTPATIENT_CLINIC_OR_DEPARTMENT_OTHER): Payer: Self-pay | Admitting: *Deleted

## 2017-03-25 ENCOUNTER — Other Ambulatory Visit: Payer: Self-pay

## 2017-03-25 DIAGNOSIS — Z7982 Long term (current) use of aspirin: Secondary | ICD-10-CM | POA: Diagnosis not present

## 2017-03-25 DIAGNOSIS — Z8042 Family history of malignant neoplasm of prostate: Secondary | ICD-10-CM | POA: Insufficient documentation

## 2017-03-25 DIAGNOSIS — I10 Essential (primary) hypertension: Secondary | ICD-10-CM | POA: Insufficient documentation

## 2017-03-25 DIAGNOSIS — Z79899 Other long term (current) drug therapy: Secondary | ICD-10-CM | POA: Diagnosis not present

## 2017-03-25 DIAGNOSIS — E7800 Pure hypercholesterolemia, unspecified: Secondary | ICD-10-CM | POA: Diagnosis not present

## 2017-03-25 DIAGNOSIS — Z951 Presence of aortocoronary bypass graft: Secondary | ICD-10-CM | POA: Diagnosis not present

## 2017-03-25 DIAGNOSIS — K219 Gastro-esophageal reflux disease without esophagitis: Secondary | ICD-10-CM | POA: Diagnosis not present

## 2017-03-25 DIAGNOSIS — N200 Calculus of kidney: Secondary | ICD-10-CM | POA: Diagnosis present

## 2017-03-25 DIAGNOSIS — I2510 Atherosclerotic heart disease of native coronary artery without angina pectoris: Secondary | ICD-10-CM | POA: Diagnosis not present

## 2017-03-25 HISTORY — DX: Gout, unspecified: M10.9

## 2017-03-25 HISTORY — DX: Other seasonal allergic rhinitis: J30.2

## 2017-03-25 HISTORY — DX: Personal history of other specified conditions: Z87.898

## 2017-03-25 HISTORY — PX: CYSTOSCOPY/URETEROSCOPY/HOLMIUM LASER/STENT PLACEMENT: SHX6546

## 2017-03-25 HISTORY — DX: Benign prostatic hyperplasia with lower urinary tract symptoms: N40.1

## 2017-03-25 HISTORY — DX: Hypercalciuria: R82.994

## 2017-03-25 HISTORY — DX: Cyst of kidney, acquired: N28.1

## 2017-03-25 HISTORY — DX: Other complications of anesthesia, initial encounter: T88.59XA

## 2017-03-25 HISTORY — DX: Left bundle-branch block, unspecified: I44.7

## 2017-03-25 HISTORY — DX: Adverse effect of unspecified anesthetic, initial encounter: T41.45XA

## 2017-03-25 HISTORY — DX: Calculus of kidney: N20.0

## 2017-03-25 HISTORY — DX: Nocturia: R35.1

## 2017-03-25 SURGERY — CYSTOSCOPY/URETEROSCOPY/HOLMIUM LASER/STENT PLACEMENT
Anesthesia: General | Site: Ureter | Laterality: Right

## 2017-03-25 MED ORDER — ONDANSETRON HCL 4 MG/2ML IJ SOLN
INTRAMUSCULAR | Status: DC | PRN
  Administered 2017-03-25: 4 mg via INTRAVENOUS

## 2017-03-25 MED ORDER — FENTANYL CITRATE (PF) 100 MCG/2ML IJ SOLN
INTRAMUSCULAR | Status: AC
  Filled 2017-03-25: qty 2

## 2017-03-25 MED ORDER — OXYCODONE HCL 5 MG PO TABS
ORAL_TABLET | ORAL | Status: AC
  Filled 2017-03-25: qty 1

## 2017-03-25 MED ORDER — PHENYLEPHRINE 40 MCG/ML (10ML) SYRINGE FOR IV PUSH (FOR BLOOD PRESSURE SUPPORT)
PREFILLED_SYRINGE | INTRAVENOUS | Status: AC
  Filled 2017-03-25: qty 10

## 2017-03-25 MED ORDER — FENTANYL CITRATE (PF) 100 MCG/2ML IJ SOLN
INTRAMUSCULAR | Status: DC | PRN
  Administered 2017-03-25 (×2): 25 ug via INTRAVENOUS
  Administered 2017-03-25: 50 ug via INTRAVENOUS

## 2017-03-25 MED ORDER — HYDROMORPHONE HCL 1 MG/ML IJ SOLN
0.2500 mg | INTRAMUSCULAR | Status: DC | PRN
  Filled 2017-03-25: qty 0.5

## 2017-03-25 MED ORDER — LIDOCAINE 2% (20 MG/ML) 5 ML SYRINGE
INTRAMUSCULAR | Status: AC
  Filled 2017-03-25: qty 5

## 2017-03-25 MED ORDER — SODIUM CHLORIDE 0.9 % IR SOLN
Status: DC | PRN
  Administered 2017-03-25: 1 via INTRAVESICAL

## 2017-03-25 MED ORDER — CEFAZOLIN SODIUM-DEXTROSE 2-4 GM/100ML-% IV SOLN
2.0000 g | INTRAVENOUS | Status: AC
  Administered 2017-03-25: 2 g via INTRAVENOUS
  Filled 2017-03-25: qty 100

## 2017-03-25 MED ORDER — CEFAZOLIN SODIUM-DEXTROSE 2-4 GM/100ML-% IV SOLN
INTRAVENOUS | Status: AC
Start: 2017-03-25 — End: ?
  Filled 2017-03-25: qty 100

## 2017-03-25 MED ORDER — ONDANSETRON HCL 4 MG/2ML IJ SOLN
INTRAMUSCULAR | Status: AC
  Filled 2017-03-25: qty 2

## 2017-03-25 MED ORDER — MIDAZOLAM HCL 2 MG/2ML IJ SOLN
INTRAMUSCULAR | Status: AC
  Filled 2017-03-25: qty 2

## 2017-03-25 MED ORDER — SODIUM CHLORIDE 0.9 % IV SOLN
INTRAVENOUS | Status: DC
  Administered 2017-03-25 (×2): via INTRAVENOUS
  Filled 2017-03-25: qty 1000

## 2017-03-25 MED ORDER — IOHEXOL 300 MG/ML  SOLN
INTRAMUSCULAR | Status: DC | PRN
  Administered 2017-03-25: 6 mL

## 2017-03-25 MED ORDER — LIDOCAINE 2% (20 MG/ML) 5 ML SYRINGE
INTRAMUSCULAR | Status: DC | PRN
  Administered 2017-03-25: 80 mg via INTRAVENOUS

## 2017-03-25 MED ORDER — PROPOFOL 10 MG/ML IV BOLUS
INTRAVENOUS | Status: DC | PRN
Start: 2017-03-25 — End: 2017-03-25
  Administered 2017-03-25: 160 mg via INTRAVENOUS

## 2017-03-25 MED ORDER — DEXAMETHASONE SODIUM PHOSPHATE 10 MG/ML IJ SOLN
INTRAMUSCULAR | Status: AC
  Filled 2017-03-25: qty 1

## 2017-03-25 MED ORDER — PROPOFOL 10 MG/ML IV BOLUS
INTRAVENOUS | Status: AC
  Filled 2017-03-25: qty 20

## 2017-03-25 MED ORDER — DEXAMETHASONE SODIUM PHOSPHATE 4 MG/ML IJ SOLN
INTRAMUSCULAR | Status: DC | PRN
  Administered 2017-03-25: 10 mg via INTRAVENOUS

## 2017-03-25 MED ORDER — OXYCODONE HCL 5 MG/5ML PO SOLN
5.0000 mg | Freq: Once | ORAL | Status: AC | PRN
  Filled 2017-03-25: qty 5

## 2017-03-25 MED ORDER — PHENYLEPHRINE 40 MCG/ML (10ML) SYRINGE FOR IV PUSH (FOR BLOOD PRESSURE SUPPORT)
PREFILLED_SYRINGE | INTRAVENOUS | Status: DC | PRN
  Administered 2017-03-25: 160 ug via INTRAVENOUS
  Administered 2017-03-25: 80 ug via INTRAVENOUS
  Administered 2017-03-25: 120 ug via INTRAVENOUS
  Administered 2017-03-25: 80 ug via INTRAVENOUS
  Administered 2017-03-25: 120 ug via INTRAVENOUS

## 2017-03-25 MED ORDER — OXYCODONE HCL 5 MG PO TABS
5.0000 mg | ORAL_TABLET | Freq: Once | ORAL | Status: AC | PRN
  Administered 2017-03-25: 5 mg via ORAL
  Filled 2017-03-25: qty 1

## 2017-03-25 SURGICAL SUPPLY — 23 items
BAG DRAIN URO-CYSTO SKYTR STRL (DRAIN) ×3 IMPLANT
BASKET LASER NITINOL 1.9FR (BASKET) ×3 IMPLANT
BASKET STONE 1.7 NGAGE (UROLOGICAL SUPPLIES) ×3 IMPLANT
CATH INTERMIT  6FR 70CM (CATHETERS) IMPLANT
CATH URET DUAL LUMEN 6-10FR 50 (CATHETERS) ×3 IMPLANT
CLOTH BEACON ORANGE TIMEOUT ST (SAFETY) ×3 IMPLANT
FIBER LASER FLEXIVA 365 (UROLOGICAL SUPPLIES) IMPLANT
FIBER LASER TRAC TIP (UROLOGICAL SUPPLIES) ×3 IMPLANT
GOWN STRL REUS W/TWL XL LVL3 (GOWN DISPOSABLE) ×3 IMPLANT
GUIDEWIRE STR DUAL SENSOR (WIRE) ×3 IMPLANT
INFUSOR MANOMETER BAG 3000ML (MISCELLANEOUS) ×3 IMPLANT
IV NS 1000ML (IV SOLUTION) ×2
IV NS 1000ML BAXH (IV SOLUTION) ×1 IMPLANT
IV NS IRRIG 3000ML ARTHROMATIC (IV SOLUTION) ×3 IMPLANT
KIT RM TURNOVER CYSTO AR (KITS) ×3 IMPLANT
MANIFOLD NEPTUNE II (INSTRUMENTS) ×3 IMPLANT
NS IRRIG 500ML POUR BTL (IV SOLUTION) ×3 IMPLANT
PACK CYSTO (CUSTOM PROCEDURE TRAY) ×3 IMPLANT
SHEATH ACCESS URETERAL 38CM (SHEATH) ×3 IMPLANT
STENT URET 6FRX26 CONTOUR (STENTS) ×3 IMPLANT
TUBE CONNECTING 12'X1/4 (SUCTIONS) ×1
TUBE CONNECTING 12X1/4 (SUCTIONS) ×2 IMPLANT
TUBE FEEDING 8FR 16IN STR KANG (MISCELLANEOUS) IMPLANT

## 2017-03-25 NOTE — Interval H&P Note (Signed)
History and Physical Interval Note:  03/25/2017 9:55 AM  [name] [name]  has presented today for surgery, with the diagnosis of RIGHT NEPHROLITHIASIS  The various methods of treatment have been discussed with the patient and family. After consideration of risks, benefits and other options for treatment, the patient has consented to  Procedure(s): CYSTOSCOPY RIGHT URETEROSCOPYVHOLMIUM LASER STENT PLACEMENT (Right) as a surgical intervention .  The patient's history has been reviewed, patient examined, no change in status, stable for surgery.  I have reviewed the patient's chart and labs.  Questions were answered to the patient's satisfaction.     Ray ChurchEugene D Bell, III

## 2017-03-25 NOTE — Anesthesia Procedure Notes (Signed)
Procedure Name: LMA Insertion Date/Time: 03/25/2017 9:15 AM Performed by: Sharee HolsterMASSAGEE, TERRY Pre-anesthesia Checklist: Patient identified, Emergency Drugs available, Suction available and Patient being monitored Patient Re-evaluated:Patient Re-evaluated prior to induction Oxygen Delivery Method: Circle system utilized Preoxygenation: Pre-oxygenation with 100% oxygen Induction Type: IV induction Ventilation: Mask ventilation without difficulty LMA: LMA inserted LMA Size: 4.0 Number of attempts: 1 Airway Equipment and Method: Bite block Placement Confirmation: positive ETCO2 Tube secured with: Tape Dental Injury: Teeth and Oropharynx as per pre-operative assessment

## 2017-03-25 NOTE — H&P (Signed)
CC: I have kidney stones.(Surgery)  HPI: [name] [name] is a 71 year-old male patient who is here for renal calculi after a surgical intervention.  The problem is on the left side. He had ureteroscopy for treatment of his renal calculi. Patient denies stent, eswl, and percutaneous lithotomy. This procedure was done 03/09/2017. He did not pass stone fragments. This is not his first kidney stone. He does not have a stent in place.   He is not currently having flank pain, back pain, groin pain, nausea, vomiting, fever or chills.   He does not have dysuria. He does not have urgency. He does have frequency.   The patient has a history of nephrolithiasis. He went to the emergency department this past weekend with a ureteral calculus and underwent left ureteroscopy, laser lithotripsy and ureteral stent placement with Dr Berneice HeinrichManny. He pulled his stent today. He is not having any pain and he is doing well. He also has renal calculi on the right. He has had multiple procedures in the past including ESWL and ureteroscopy.   ALLERGIES: No Allergies   MEDICATIONS: Aspirin 81 mg tablet, chewable  Finasteride 5 mg tablet  Metoprolol Tartrate 25 mg tablet  Omeprazole 20 mg tablet, delayed release  Tamsulosin Hcl 0.4 mg capsule, ext release 24 hr  Allegra Allergy 180 mg tablet  Allopurinol 100 mg tablet Oral  Centrum Silver TABS Oral  Co Q-10  Micardis 80 MG Oral Tablet Oral  Pravastatin Sodium 40 mg tablet  Telmisartan 20 mg tablet  Vitamin D3  Zetia 10 mg tablet    GU PSH: Cysto Uretero Lithotripsy - 2014 ESWL - 2013, 2013, 2013 Pyelolithotomy - 2013     PSH Notes: Cystoscopy With Pyeloscopy With Lithotripsy, Lithotripsy, Lithotripsy - Whole Body (Extracorporeal Shock Wave), Pyelotomy With Lithotomy, Lithotripsy, Back Surgery   NON-GU PSH: Back Surgery (Unspecified) CABG (coronary artery bypass grafting)   GU PMH: BPH w/LUTS, Benign localized prostatic hyperplasia with lower urinary tract symptoms  (LUTS) - 2014 History of urolithiasis, Nephrolithiasis - 2014 Renal calculus, Nephrolithiasis - 2014 Ureteral calculus, Calculus of ureter - 2014 Urinary Retention, Unspec, Urinary retention - 2014   NON-GU PMH: Gout, Gout - 2014 Personal history of other diseases of the circulatory system, History of hypertension - 2014 Personal history of other diseases of the digestive system, History of esophageal reflux - 2014 Personal history of other endocrine, nutritional and metabolic disease, History of hypercholesterolemia - 2014 Encounter for general adult medical examination without abnormal findings, Encounter for preventive health examination GERD Heart disease, unspecified Hypercholesterolemia Hypertension   FAMILY HISTORY: Death In The Family Father - Runs In Family Prostate Cancer - Father   SOCIAL HISTORY: Marital Status: Married Preferred Language: English; Race: White Has never drank.  Drinks 2 caffeinated drinks per day. Patient's occupation is/was retired.    Notes: Marital History - Currently Married, Never A Smoker   REVIEW OF SYSTEMS:    GU Review Male:   Patient reports frequent urination, get up at night to urinate, and erection problems. Patient denies hard to postpone urination, burning/ pain with urination, leakage of urine, stream starts and stops, trouble starting your stream, have to strain to urinate , and penile pain.  Gastrointestinal (Upper):   Patient reports nausea. Patient denies vomiting and indigestion/ heartburn.  Gastrointestinal (Lower):   Patient denies diarrhea and constipation.  Constitutional:   Patient denies fever, night sweats, weight loss, and fatigue.  Skin:   Patient denies skin rash/ lesion and itching.  Eyes:  Patient denies blurred vision and double vision.  Ears/ Nose/ Throat:   Patient denies sore throat and sinus problems.  Hematologic/Lymphatic:   Patient denies swollen glands and easy bruising.  Cardiovascular:   Patient denies leg  swelling and chest pains.  Respiratory:   Patient denies cough and shortness of breath.  Endocrine:   Patient denies excessive thirst.  Musculoskeletal:   Patient denies back pain and joint pain.  Neurological:   Patient denies headaches and dizziness.  Psychologic:   Patient denies anxiety and depression.   VITAL SIGNS:      03/11/2017 04:04 PM  Weight 216 lb / 97.98 kg  Height 66 in / 167.64 cm  BP 149/91 mmHg  Pulse 91 /min  Temperature 97.6 F / 36.4 C  BMI 34.9 kg/m   MULTI-SYSTEM PHYSICAL EXAMINATION:    Constitutional: Well-nourished. No physical deformities. Normally developed. Good grooming.  Respiratory: No labored breathing, no use of accessory muscles.   Cardiovascular: Normal temperature, adequate perfusion  Skin: No paleness, no jaundice,  Neurologic / Psychiatric: Oriented to time, oriented to place, oriented to person. No depression, no anxiety, no agitation.  Gastrointestinal: No mass, no tenderness, no rigidity, non obese abdomen. No CVA tenderness  Musculoskeletal: Normal gait and station of head and neck.    PAST DATA REVIEWED:  Source Of History:  Patient   PROCEDURES:         Urinalysis w/Scope Dipstick Dipstick Cont'd Micro  Color: Yellow Bilirubin: Neg WBC/hpf: >60/hpf  Appearance: Cloudy Ketones: Neg RBC/hpf: 0 - 2/hpf  Specific Gravity: 1.025 Blood: 3+ Bacteria: Rare (0-9/hpf)  pH: 6.0 Protein: 1+ Cystals: NS (Not Seen)  Glucose: Neg Urobilinogen: 0.2 Casts: NS (Not Seen)    Nitrites: Neg Trichomonas: Not Present    Leukocyte Esterase: Trace Mucous: Not Present      Epithelial Cells: 0 - 5/hpf      Yeast: NS (Not Seen)      Sperm: Not Present   ASSESSMENT:      ICD-10 Details  1 GU:   Renal calculus - N20.0    PLAN:          Orders Labs Urine Culture         Schedule Return Visit/Planned Activity: Next Available Appointment - Schedule Surgery         Document Letter(s):  Created for Patient: Clinical Summary        Notes:   The patient  is doing well status post left ureteroscopy, laser lithotripsy and ureteral stent placement.   He still has multiple right-sided renal calculi up to 8 mm.   We discussed the management of urinary stones. These options include observation, ureteroscopy, shockwave lithotripsy. We discussed which options are relevant to these particular stones. We discussed the natural history of stones as well as the complications of untreated stones and the impact on quality of life without treatment as well as with each of the above listed treatments. We also discussed the efficacy of each treatment in its ability to clear the stone burden. With any of these management options I discussed the signs and symptoms of infection and the need for emergent treatment should these be experienced. For each option we discussed the ability of each procedure to clear the patient of their stone burden.   For observation I described the risks which include but are not limited to silent renal damage, life-threatening infection, need for emergent surgery, failure to pass stone, and pain.   For ureteroscopy I described the risks  which include heart attack, stroke, pulmonary embolus, death, bleeding, infection, damage to contiguous structures, positioning injury, ureteral stricture, ureteral avulsion, ureteral injury, need for ureteral stent, inability to perform ureteroscopy, need for an interval procedure, inability to clear stone burden, stent discomfort and pain.   For shockwave lithotripsy I described the risks which include arrhythmia, kidney contusion, kidney hemorrhage, need for transfusion, pain, inability to break up stone, inability to pass stone fragments, Steinstrasse, infection associated with obstructing stones, need for different surgical procedure, need for repeat shockwave lithotripsy, and death.   It would like to proceed with right ureteroscopy, laser lithotripsy and ureteral stent placement.      Signed by Modena Slater, III, M.D. on 03/11/17 at 5:05 PM (EDT

## 2017-03-25 NOTE — Transfer of Care (Signed)
  Last Vitals:  Vitals:   03/25/17 0703 03/25/17 1005  BP: (!) 141/92 138/90  Pulse: 85 84  Resp: 16 10  Temp: 36.8 C 36.9 C  SpO2: 96% 96%    Last Pain:  Vitals:   03/25/17 0703  TempSrc: Oral         Immediate Anesthesia Transfer of Care Note  Patient: [name] [name]  Procedure(s) Performed: Procedure(s) (LRB): CYSTOSCOPY RIGHT URETEROSCOPYVHOLMIUM LASER STENT PLACEMENT (Right)  Patient Location: PACU  Anesthesia Type: General  Level of Consciousness: awake, alert  and oriented  Airway & Oxygen Therapy: Patient Spontanous Breathing and Patient connected to nasal cannula oxygen  Post-op Assessment: Report given to PACU RN and Post -op Vital signs reviewed and stable  Post vital signs: Reviewed and stable  Complications: No apparent anesthesia complications

## 2017-03-25 NOTE — Op Note (Signed)
Operative Note  Preoperative diagnosis:  1. Right renal calculi  Postoperative diagnosis: 1. Right renal calculi  Procedure(s): 1. Cystoscopy with right retrograde pyelogram right ureteroscopy with laser lithotripsy stone basketing and ureteral stent placement  Surgeon: Modena SlaterEugene Khair Chasteen, MD  Assistants:none  Anesthesia: Gen.  Complications: none immediate  EBL: minimal  Specimens: 1. Renal calculus for analysis  Drains/Catheters: 1. 6 x 26 double-J ureteral stent  Intraoperative findings: 2 right renal calculi. Bladder and urethra were normal.  Indication: this is a 71 year old male with a history of bilateral nephrolithiasis who recently underwent a left ureteroscopy with laser lithotripsy and he presents for the aformentioned operation.  Description of procedure:  The patient was identified and consent was obtained. he was taken back to the operating room and placed in the supine position. He was placed under general anesthesia and converted to dorsal lithotomy. He was prepped and draped in the standard sterile fashion.a timeout was performed. A 21 French cystoscope was advanced into the urethra and into the bladder. A complete cystoscopy was performed with no abnormal findings. The right ureter was cannulated with a sensor wire and advanced that to the kidney under fluoroscopic guidance. A dual lumen ureteral catheter was advanced over the wire and up the ureter under fluoroscopic guidance and a retrograde pyelogram was performed. There were no abnormal findings. A second sensor wire was advanced through the dual-lumen catheter. The scope and catheter were removed. A 12 x 14 ureteral access sheath was advanced over one of the wires up the ureter under continuous fluoroscopic guidance. Flexible ureteroscopy revealed a small stone which was basket extracted.a lower pole calculus was encountered and this was laser fragmented to less than 1 mm fragments. Larger fragments were basket  extracted until there were no significant stone fragments able to be basket extracted. Complete pyeloscopy revealed no other significant stone fragments. The scope and access sheath were withdrawn and there were no evidence of any ureteral calculi or injury to the ureter. The wire was backloaded onto the cystoscope which was advanced into the bladder and a 6 x 26 double-J ureteral stent was placed followed by removal of the wire. Fluoroscopy confirmed proximal placement and direct visualization confirmed distal placement. A string was left in place. The bladder was drained and the scope withdrawn and the patient tolerated procedure well and was stable postoperatively.  Condition: Stable  Plan:the patient will remove his ureteral stent in 3 days. He can return in 4-6 weeks for a KUB and renal ultrasound.

## 2017-03-25 NOTE — Anesthesia Postprocedure Evaluation (Signed)
Anesthesia Post Note  Patient: [name] [name]  Procedure(s) Performed: Procedure(s) (LRB): CYSTOSCOPY RIGHT URETEROSCOPYVHOLMIUM LASER STENT PLACEMENT (Right)     Patient location during evaluation: PACU Anesthesia Type: General Level of consciousness: awake and alert Pain management: pain level controlled Vital Signs Assessment: post-procedure vital signs reviewed and stable Respiratory status: spontaneous breathing, nonlabored ventilation, respiratory function stable and patient connected to nasal cannula oxygen Cardiovascular status: blood pressure returned to baseline and stable Postop Assessment: no signs of nausea or vomiting Anesthetic complications: no    Last Vitals:  Vitals:   03/25/17 1045 03/25/17 1249  BP: 136/88 (!) 160/95  Pulse: 78 81  Resp: 15 16  Temp:  36.7 C  SpO2: 94% 96%    Last Pain:  Vitals:   03/25/17 1249  TempSrc: Oral  PainSc:                  Ladarren Steiner,JAMES TERRILL

## 2017-03-25 NOTE — Anesthesia Preprocedure Evaluation (Addendum)
Anesthesia Evaluation  Patient identified by MRN, date of birth, ID band Patient awake    Reviewed: Allergy & Precautions, NPO status , Patient's Chart, lab work & pertinent test results  Airway Mallampati: II  TM Distance: >3 FB Neck ROM: Full    Dental no notable dental hx.    Pulmonary neg pulmonary ROS,    breath sounds clear to auscultation       Cardiovascular hypertension, + CAD and + CABG  + dysrhythmias  Rhythm:Regular Rate:Normal     Neuro/Psych negative neurological ROS     GI/Hepatic GERD  ,  Endo/Other    Renal/GU Renal disease     Musculoskeletal   Abdominal   Peds  Hematology   Anesthesia Other Findings   Reproductive/Obstetrics                            Anesthesia Physical Anesthesia Plan  ASA: III  Anesthesia Plan: General   Post-op Pain Management:    Induction: Intravenous  PONV Risk Score and Plan: 3 and Ondansetron, Dexamethasone, Midazolam and Propofol infusion  Airway Management Planned: LMA  Additional Equipment:   Intra-op Plan:   Post-operative Plan: Extubation in OR  Informed Consent:   Plan Discussed with:   Anesthesia Plan Comments:         Anesthesia Quick Evaluation

## 2017-03-25 NOTE — Discharge Instructions (Addendum)
You may remove your stent in 3 days (thursday morning). You had a ureteroscopy with laser lithotripsy and ureteral stent placement.  You may notice some blood in the urine.  This is okay as long as it is not severe and youre able to urinate (or if your catheter is draining if you have a Foley catheter).  Some people also experienced discomfort and a great sense of urinary urgency with a stent in place.  To help with this, make sure to take Flomax and oxybutynin as directed. You may also take pain medication as needed for discomfort from the stent. Call or go to the emergency department should you experience severe pain or fever greater than 101.   Post Anesthesia Home Care Instructions  Activity: Get plenty of rest for the remainder of the day. A responsible individual must stay with you for 24 hours following the procedure.  For the next 24 hours, DO NOT: -Drive a car -Advertising copywriterperate machinery -Drink alcoholic beverages -Take any medication unless instructed by your physician -Make any legal decisions or sign important papers.  Meals: Start with liquid foods such as gelatin or soup. Progress to regular foods as tolerated. Avoid greasy, spicy, heavy foods. If nausea and/or vomiting occur, drink only clear liquids until the nausea and/or vomiting subsides. Call your physician if vomiting continues.  Special Instructions/Symptoms: Your throat may feel dry or sore from the anesthesia or the breathing tube placed in your throat during surgery. If this causes discomfort, gargle with warm salt water. The discomfort should disappear within 24 hours.  If you had a scopolamine patch placed behind your ear for the management of post- operative nausea and/or vomiting:  1. The medication in the patch is effective for 72 hours, after which it should be removed.  Wrap patch in a tissue and discard in the trash. Wash hands thoroughly with soap and water. 2. You may remove the patch earlier than 72 hours if you  experience unpleasant side effects which may include dry mouth, dizziness or visual disturbances. 3. Avoid touching the patch. Wash your hands with soap and water after contact with the patch.   Percocet 1 tablet every 6 hours as needed for pain.

## 2017-03-26 ENCOUNTER — Encounter (HOSPITAL_BASED_OUTPATIENT_CLINIC_OR_DEPARTMENT_OTHER): Payer: Self-pay | Admitting: Urology

## 2017-07-24 ENCOUNTER — Other Ambulatory Visit: Payer: Self-pay | Admitting: Cardiology

## 2017-08-29 ENCOUNTER — Encounter: Payer: Self-pay | Admitting: Cardiology

## 2017-09-06 ENCOUNTER — Ambulatory Visit (INDEPENDENT_AMBULATORY_CARE_PROVIDER_SITE_OTHER): Payer: Medicare Other | Admitting: Cardiology

## 2017-09-06 ENCOUNTER — Encounter: Payer: Self-pay | Admitting: Cardiology

## 2017-09-06 VITALS — BP 132/76 | HR 90 | Ht 66.0 in | Wt 219.0 lb

## 2017-09-06 DIAGNOSIS — E785 Hyperlipidemia, unspecified: Secondary | ICD-10-CM

## 2017-09-06 DIAGNOSIS — I2510 Atherosclerotic heart disease of native coronary artery without angina pectoris: Secondary | ICD-10-CM

## 2017-09-06 DIAGNOSIS — I10 Essential (primary) hypertension: Secondary | ICD-10-CM

## 2017-09-06 NOTE — Progress Notes (Signed)
Cardiology Office Note:    Date:  09/06/2017   ID:  [name] [name], DOB 20-Nov-1945, MRN [id number]  PCP:  Marden NobleGates, Robert, MD  Cardiologist:  No primary care provider on file.    Referring MD: Marden NobleGates, Robert, MD   Chief Complaint  Patient presents with  . Coronary Artery Disease  . Hypertension    History of Present Illness:    [name] [name] is a 72 y.o. male with a hx of ASCAD.  He initially presented with chest pain and underwent myoview which was neg for ischemia but Chest pain continued. Cardiac CT showed coronary calcium score 1951 and severe > 70 % stenosis in prox LAD. Possible significant disease in the ostial RCA and LCX. It was felt that he could have  balanced ischemia on nuc. Cath showed severe CAD and underwent CABG by Dr. Cornelius Moraswen 11/04/15 with LIMA to distal LAD, SVG to 1st OM of LCx, seq VG to 2nd OM of LCX. He had normal LV systolic function.  He is here today for followup and is doing well.  He denies any chest pain or pressure, SOB, DOE, PND, orthopnea, LE edema, dizziness, palpitations or syncope. He is compliant with his meds and is tolerating meds with no SE.    Past Medical History:  Diagnosis Date  . BPH associated with nocturia   . CAD (coronary artery disease), native coronary artery    CARDIOLOGIST-  DR Gloris ManchesterRACI TURNER--  normal myoview 03/ 2017 but Cardiac CT 04/ 2017 calcium score 1951, >70% LAD stenosis possible disease RCA , LCX--- cath done showed severe 3V disease S/P  CABG 04/ 2017  . Chronic back pain    herniated disc  . CKD (chronic kidney disease), stage III (HCC)   . Complication of anesthesia    POST-OP URINARY RETENTION 08/ 2013  . Diverticulosis   . Gout    as of 03-20-2017 per pt stable  . History of kidney stones    METABOLIC STONE DISEASE  . History of urinary retention    POST OP SURGERY 08/ 2013  . Hypercalciuria   . Hyperlipidemia    takes Crestor daily  . Hypertension   . LBBB (left bundle branch block)   . Muscle cramps    legs  occasonally in am  . Renal calculus, right   . Renal cyst, right   . 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 Seasonal allergies     Past Surgical History:  Procedure Laterality Date  . CARDIAC CATHETERIZATION N/A 10/28/2015   Procedure: Left Heart Cath and Coronary Angiography;  Surgeon: Lyn RecordsHenry W Smith, MD;  [facility];  Service: Cardiovascular;  Laterality: N/A;  . CARDIAC CATHETERIZATION  05-21-1995  dr Verdis Primehenry smith   non-obstructive cad;  normal lvf  . CARDIOVASCULAR STRESS TEST  09/23/2015  dr Gloris Manchestertraci turner   Normal nuclear study w/ no prior infarct and no ischemia/  normal LV function and wall motion , nuclear stress ef 54%  . COLONOSCOPY  last one 2009  . CORONARY ARTERY BYPASS GRAFT N/A 11/04/2015   Procedure: CORONARY ARTERY BYPASS GRAFTING (CABG) times three using left internal mammary artery and right saphenous vein harvested with endoscope.;  Surgeon: Purcell Nailslarence H Owen, MD;  Location: [facility] OR;  Service: Open Heart Surgery;  Laterality: N/A;  . CYSTOSCOPY WITH RETROGRADE PYELOGRAM, URETEROSCOPY AND STENT PLACEMENT Left 03/09/2017   Procedure: CYSTOSCOPY WITH RETROGRADE PYELOGRAM, URETEROSCOPY AND  STENT PLACEMENT;  Surgeon: Sebastian Ache, MD;  Location: [facility] ORS;  Service: Urology;  Laterality: Left;  . CYSTOSCOPY WITH URETEROSCOPY  07/28/2012   Procedure: CYSTOSCOPY WITH URETEROSCOPY;  Surgeon: [name], MD;  Location: [facility];  Service: Urology;  Laterality: Left;     . CYSTOSCOPY/URETEROSCOPY/HOLMIUM LASER/STENT PLACEMENT Right 03/25/2017   Procedure: CYSTOSCOPY RIGHT URETEROSCOPYVHOLMIUM LASER STENT PLACEMENT;  Surgeon: Crista Elliot, MD;  Location: [facility];  Service: Urology;  Laterality: Right;  . ESOPHAGOGASTRODUODENOSCOPY (EGD) WITH ESOPHAGEAL DILATION  05/ 2018 approx.  [name] EXTRACORPOREAL SHOCK WAVE LITHOTRIPSY  multiple last one 05-29-2012  . FOOT SURGERY  1990s   right-removed bone spur   .  HOLMIUM LASER APPLICATION  07/28/2012   Procedure: HOLMIUM LASER APPLICATION;  Surgeon: [name], MD;  Location: [facility];  Service: Urology;  Laterality: Left;  left lithotripsy  . HOLMIUM LASER APPLICATION Left 03/09/2017   Procedure: HOLMIUM LASER APPLICATION;  Surgeon: Sebastian Ache, MD;  Location: [facility];  Service: Urology;  Laterality: Left;  . POSTERIOR LUMBAR FUSION  03/07/2012   w/ laminecotmy L4-5  . PYELOLITHOTOMY Right 01/1982  . TEE WITHOUT CARDIOVERSION N/A 11/04/2015   Procedure: TRANSESOPHAGEAL ECHOCARDIOGRAM (TEE);  Surgeon: [name], MD;  Location: [facility];  Service: Open Heart Surgery;  Laterality: N/A;  . TONSILLECTOMY  age 68  . URETEROLITHOTOMY Right 03/1994    Current Medications: Current Meds  Medication Sig  . allopurinol (ZYLOPRIM) 100 MG tablet Take 100 mg by mouth 2 (two) times daily.   [name] aspirin EC 81 MG EC tablet Take 1 tablet (81 mg total) by mouth daily.  . cholecalciferol (VITAMIN D) 1000 UNITS tablet Take 1,000 Units by mouth every morning.   . Coenzyme Q10 (COQ10) 100 MG CAPS Take 1 capsule by mouth every evening.   . ezetimibe (ZETIA) 10 MG tablet Take 1 tablet (10 mg total) by mouth daily. (Patient taking differently: Take 10 mg by mouth every morning. )  . fexofenadine (ALLEGRA) 180 MG tablet Take 180 mg by mouth daily as needed. (allergies)  . finasteride (PROSCAR) 5 MG tablet TAKE 1 TABLET(5MG ) BY MOUTH DALIY  . metoprolol tartrate (LOPRESSOR) 25 MG tablet Take 1 tablet (25 mg total) by mouth 2 (two) times daily. Please keep upcoming appt in February for future refills. Thank you (Patient taking differently: Take 25 mg by mouth 2 (two) times daily. )  . Multiple Vitamin (MULTIVITAMIN WITH MINERALS) TABS Take 1 tablet by mouth every morning.   [name] MYRBETRIQ 25 MG TB24 tablet Take 1 tablet by mouth daily.  [name] omeprazole (PRILOSEC) 20 MG capsule Take 20 mg by mouth every evening.  . pravastatin (PRAVACHOL) 40 MG tablet TK 1 T PO QPM  . senna-docusate  (SENOKOT-S) 8.6-50 MG tablet Take 1 tablet by mouth 2 (two) times daily. While taking strong pain meds to prevent constipation. (Patient taking differently: Take 1 tablet by mouth 2 (two) times daily as needed. While taking strong pain meds to prevent constipation.)  . Tamsulosin HCl (FLOMAX) 0.4 MG CAPS Take 0.4 mg by mouth daily after supper.  . telmisartan (MICARDIS) 20 MG tablet Take 1 tablet (20 mg total) by mouth every morning.  . traMADol (ULTRAM) 50 MG tablet Take 1-2 tablets (50-100 mg total) by mouth every 6 (six) hours as needed for moderate pain or severe pain. Post-operatively.     Allergies:   Atorvastatin; Hctz [hydrochlorothiazide]; and Rosuvastatin   Social History   Socioeconomic History  . Marital status: Married  Spouse name: None  . Number of children: None  . Years of education: None  . Highest education level: None  Social Needs  . Financial resource strain: None  . Food insecurity - worry: None  . Food insecurity - inability: None  . Transportation needs - medical: None  . Transportation needs - non-medical: None  Occupational History  . None  Tobacco Use  . Smoking status: Never Smoker  . Smokeless tobacco: Never Used  Substance and Sexual Activity  . Alcohol use: No    Alcohol/week: 0.0 oz  . Drug use: No  . Sexual activity: Yes    Comment: vasectomy 1980s  Other Topics Concern  . None  Social History Narrative  . None     Family History: The patient's family history includes Dementia in his mother; Prostate cancer in his father.  ROS:   Please see the history of present illness.    ROS  All other systems reviewed and negative.   EKGs/Labs/Other Studies Reviewed:    The following studies were reviewed today: none  EKG:  EKG is not ordered today.   Recent Labs: 09/07/2016: ALT 22 03/09/2017: Hemoglobin 13.9; Platelets 145 03/10/2017: BUN 22; Creatinine, Ser 1.91; Potassium 4.0; Sodium 139   Recent Lipid Panel    Component Value  Date/Time   CHOL 149 09/07/2016 0955   TRIG 202 (H) 09/07/2016 0955   HDL 44 09/07/2016 0955   CHOLHDL 3.4 09/07/2016 0955   CHOLHDL 3.4 12/19/2015 1005   VLDL 31 (H) 12/19/2015 1005   LDLCALC 65 09/07/2016 0955    Physical Exam:    VS:  BP 132/76   Pulse 90   Ht 5\' 6"  (1.676 m)   Wt 219 lb (99.3 kg)   SpO2 92%   BMI 35.35 kg/m     Wt Readings from Last 3 Encounters:  09/06/17 219 lb (99.3 kg)  03/25/17 209 lb 8 oz (95 kg)  03/09/17 216 lb (98 kg)     GEN:  Well nourished, well developed in no acute distress HEENT: Normal NECK: No JVD; No carotid bruits LYMPHATICS: No lymphadenopathy CARDIAC: RRR, no murmurs, rubs, gallops RESPIRATORY:  Clear to auscultation without rales, wheezing or rhonchi  ABDOMEN: Soft, non-tender, non-distended MUSCULOSKELETAL:  No edema; No deformity  SKIN: Warm and dry NEUROLOGIC:  Alert and oriented x 3 PSYCHIATRIC:  Normal affect   ASSESSMENT:    1. Coronary artery disease involving native coronary artery of native heart without angina pectoris   2. Essential (primary) hypertension   3. Dyslipidemia    PLAN:    In order of problems listed above:  1.  ASCAD - cath showed 80% mid left circumflex, 80% ostial LAD, 60% left main, 75% ostial left circumflex, 75% ramus, 40% ostial RCA.  He is status post CABG with LIMA to the LAD, SVG to 1st OM of LCx, seq VG to 2nd OM of LCX.  He is doing well today with no complaints of chest pain.  He will continue on aspirin 81 mg daily, Lopressor 25 mg twice daily, pravastatin 40 mg daily.  2.  Hypertension-blood pressure is well controlled on exam today.  He will continue on Lopressor 25 mg twice daily and telmisartan 20 mg daily.  I will check to bmet today.  3.  Hyperlipidemia with LDL goal less than 70.  I will get an FLP and ALT today.   Medication Adjustments/Labs and Tests Ordered: Current medicines are reviewed at length with the patient today.  Concerns regarding  medicines are outlined above.    No orders of the defined types were placed in this encounter.  No orders of the defined types were placed in this encounter.   Signed, Armanda Magic, MD  09/06/2017 10:48 AM    [facility]

## 2017-09-06 NOTE — Patient Instructions (Signed)
Medication Instructions:  Your physician recommends that you continue on your current medications as directed. Please refer to the Current Medication list given to you today.  Labwork: Your physician recommends that you return for lab work on February 27th, 2019 for kidney function, liver function, and fasting lipids Lab opens at 7:30AM  Testing/Procedures: None ordered   Follow-Up: Your physician wants you to follow-up in: 1 year with Dr. Mayford Knifeurner. You will receive a reminder letter in the mail two months in advance. If you don't receive a letter, please call our office to schedule the follow-up appointment.  Any Other Special Instructions Will Be Listed Below (If Applicable).  Thank you for choosing [facility]    Lyda PeroneRena Cadynce Garrette, RN  [phone]   If you need a refill on your cardiac medications before your next appointment, please call your pharmacy.

## 2017-09-11 ENCOUNTER — Other Ambulatory Visit: Payer: Medicare Other | Admitting: *Deleted

## 2017-09-11 DIAGNOSIS — E785 Hyperlipidemia, unspecified: Secondary | ICD-10-CM

## 2017-09-11 LAB — LIPID PANEL
CHOL/HDL RATIO: 3.1 ratio (ref 0.0–5.0)
Cholesterol, Total: 137 mg/dL (ref 100–199)
HDL: 44 mg/dL (ref >39)
LDL Calculated: 62 mg/dL (ref 0–99)
Triglycerides: 157 mg/dL — ABNORMAL HIGH (ref 0–149)
VLDL CHOLESTEROL CAL: 31 mg/dL (ref 5–40)

## 2017-09-11 LAB — HEPATIC FUNCTION PANEL
ALBUMIN: 4.4 g/dL (ref 3.5–4.8)
ALT: 24 IU/L (ref 0–44)
AST: 25 IU/L (ref 0–40)
Alkaline Phosphatase: 101 IU/L (ref 39–117)
BILIRUBIN TOTAL: 0.4 mg/dL (ref 0.0–1.2)
Bilirubin, Direct: 0.13 mg/dL (ref 0.00–0.40)
Total Protein: 7.3 g/dL (ref 6.0–8.5)

## 2017-09-11 LAB — BASIC METABOLIC PANEL
BUN/Creatinine Ratio: 10 (ref 10–24)
BUN: 16 mg/dL (ref 8–27)
CALCIUM: 9.4 mg/dL (ref 8.6–10.2)
CHLORIDE: 105 mmol/L (ref 96–106)
CO2: 23 mmol/L (ref 20–29)
Creatinine, Ser: 1.56 mg/dL — ABNORMAL HIGH (ref 0.76–1.27)
GFR calc Af Amer: 51 mL/min/{1.73_m2} — ABNORMAL LOW (ref >59)
GFR calc non Af Amer: 44 mL/min/{1.73_m2} — ABNORMAL LOW (ref >59)
Glucose: 94 mg/dL (ref 65–99)
POTASSIUM: 4.4 mmol/L (ref 3.5–5.2)
Sodium: 142 mmol/L (ref 134–144)

## 2017-10-12 ENCOUNTER — Other Ambulatory Visit: Payer: Self-pay | Admitting: Cardiology

## 2017-10-19 ENCOUNTER — Other Ambulatory Visit: Payer: Self-pay | Admitting: Cardiology

## 2018-04-14 IMAGING — CR DG CHEST 1V PORT
1 series · 1 of 1 positions shown · non-contrast
Comparison: 11/02/2015

CLINICAL DATA: Recent cardiac surgery

EXAM:
PORTABLE CHEST 1 VIEW

[AP]
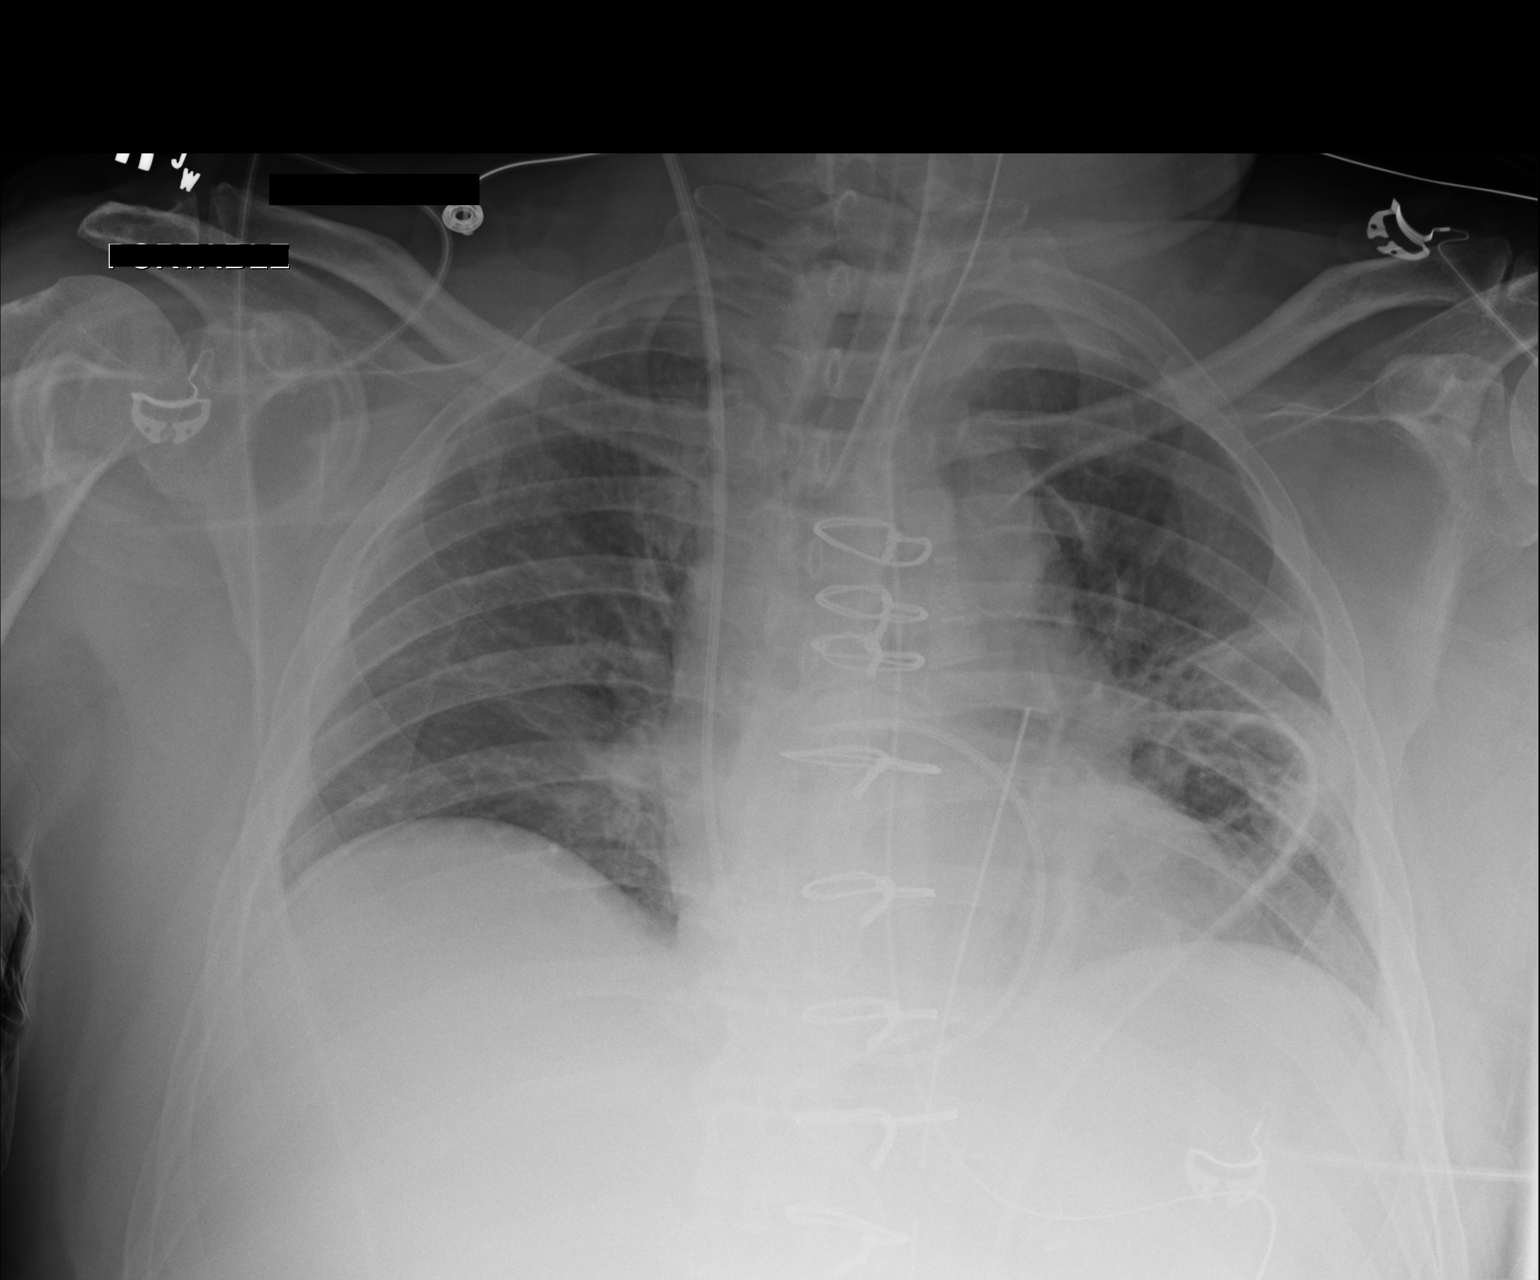

[1 of 1 positions shown; findings below may reference images not displayed]

FINDINGS: Cardiac shadow is within normal limits. A mediastinal drain and left
thoracostomy catheter are seen. An endotracheal tube is noted in
satisfactory position. Nasogastric catheter extends into the
stomach. Swan-Ganz catheter is noted in the right pulmonary artery.
The lungs are hypoinflated with some minimal right basilar
atelectasis and some more marked atelectatic changes in the left mid
lung. No acute bony abnormality is seen.
IMPRESSION: Tubes and lines as described.

Bilateral atelectatic changes left greater than right.

## 2018-04-16 IMAGING — CR DG CHEST 1V PORT
1 series · 1 of 1 positions shown · non-contrast
Comparison: 11/05/2015

CLINICAL DATA: Atelectasis

EXAM:
PORTABLE CHEST 1 VIEW

[AP]
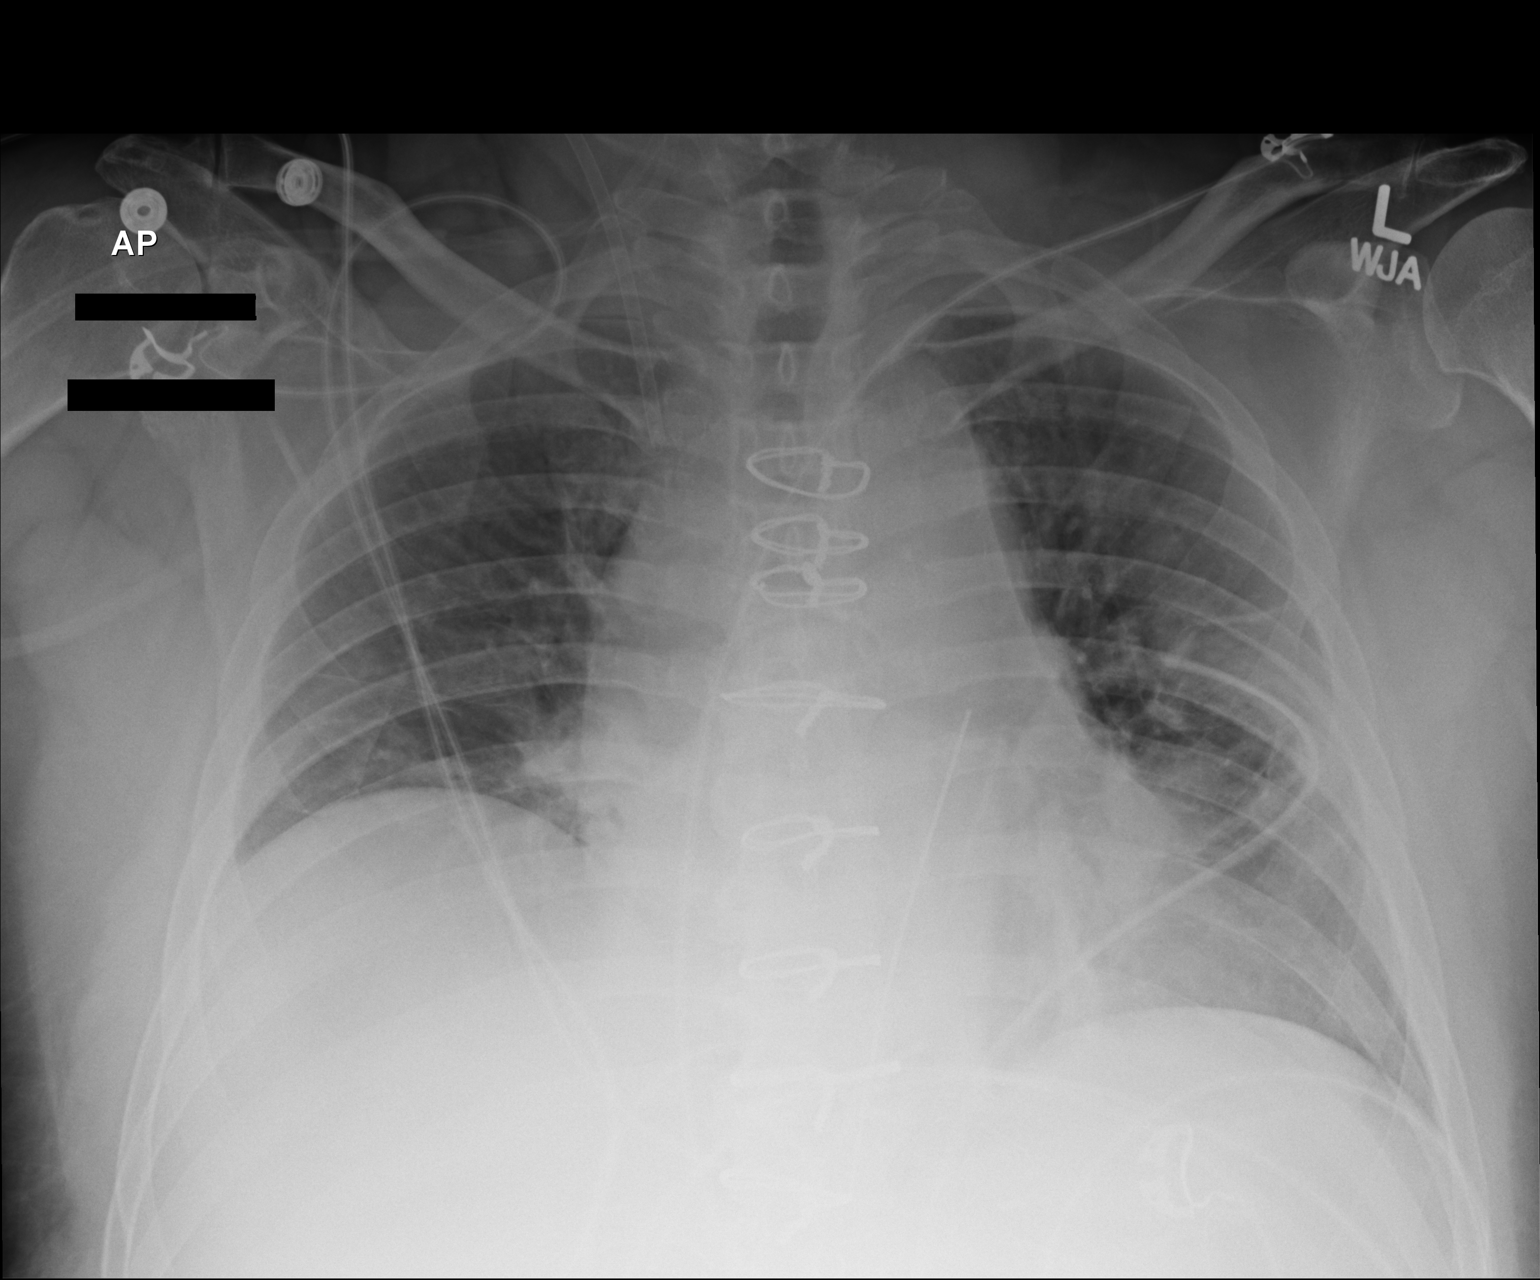

[1 of 1 positions shown; findings below may reference images not displayed]

FINDINGS: Retraction of Swan-Ganz catheter. Mediastinal drain and LEFT chest
tube remain. Improved aeration to the LEFT lung base. No
pneumothorax or pulmonary edema.
IMPRESSION: Improved aeration to the LEFT lung base.  No pneumothorax

## 2018-05-15 IMAGING — CR DG CHEST 2V
2 series · 2 of 2 positions shown · non-contrast
Comparison: Chest x-ray dated 11/07/2015.

CLINICAL DATA: History of CABG in hypertension.

EXAM:
CHEST  2 VIEW

[w chest pa]
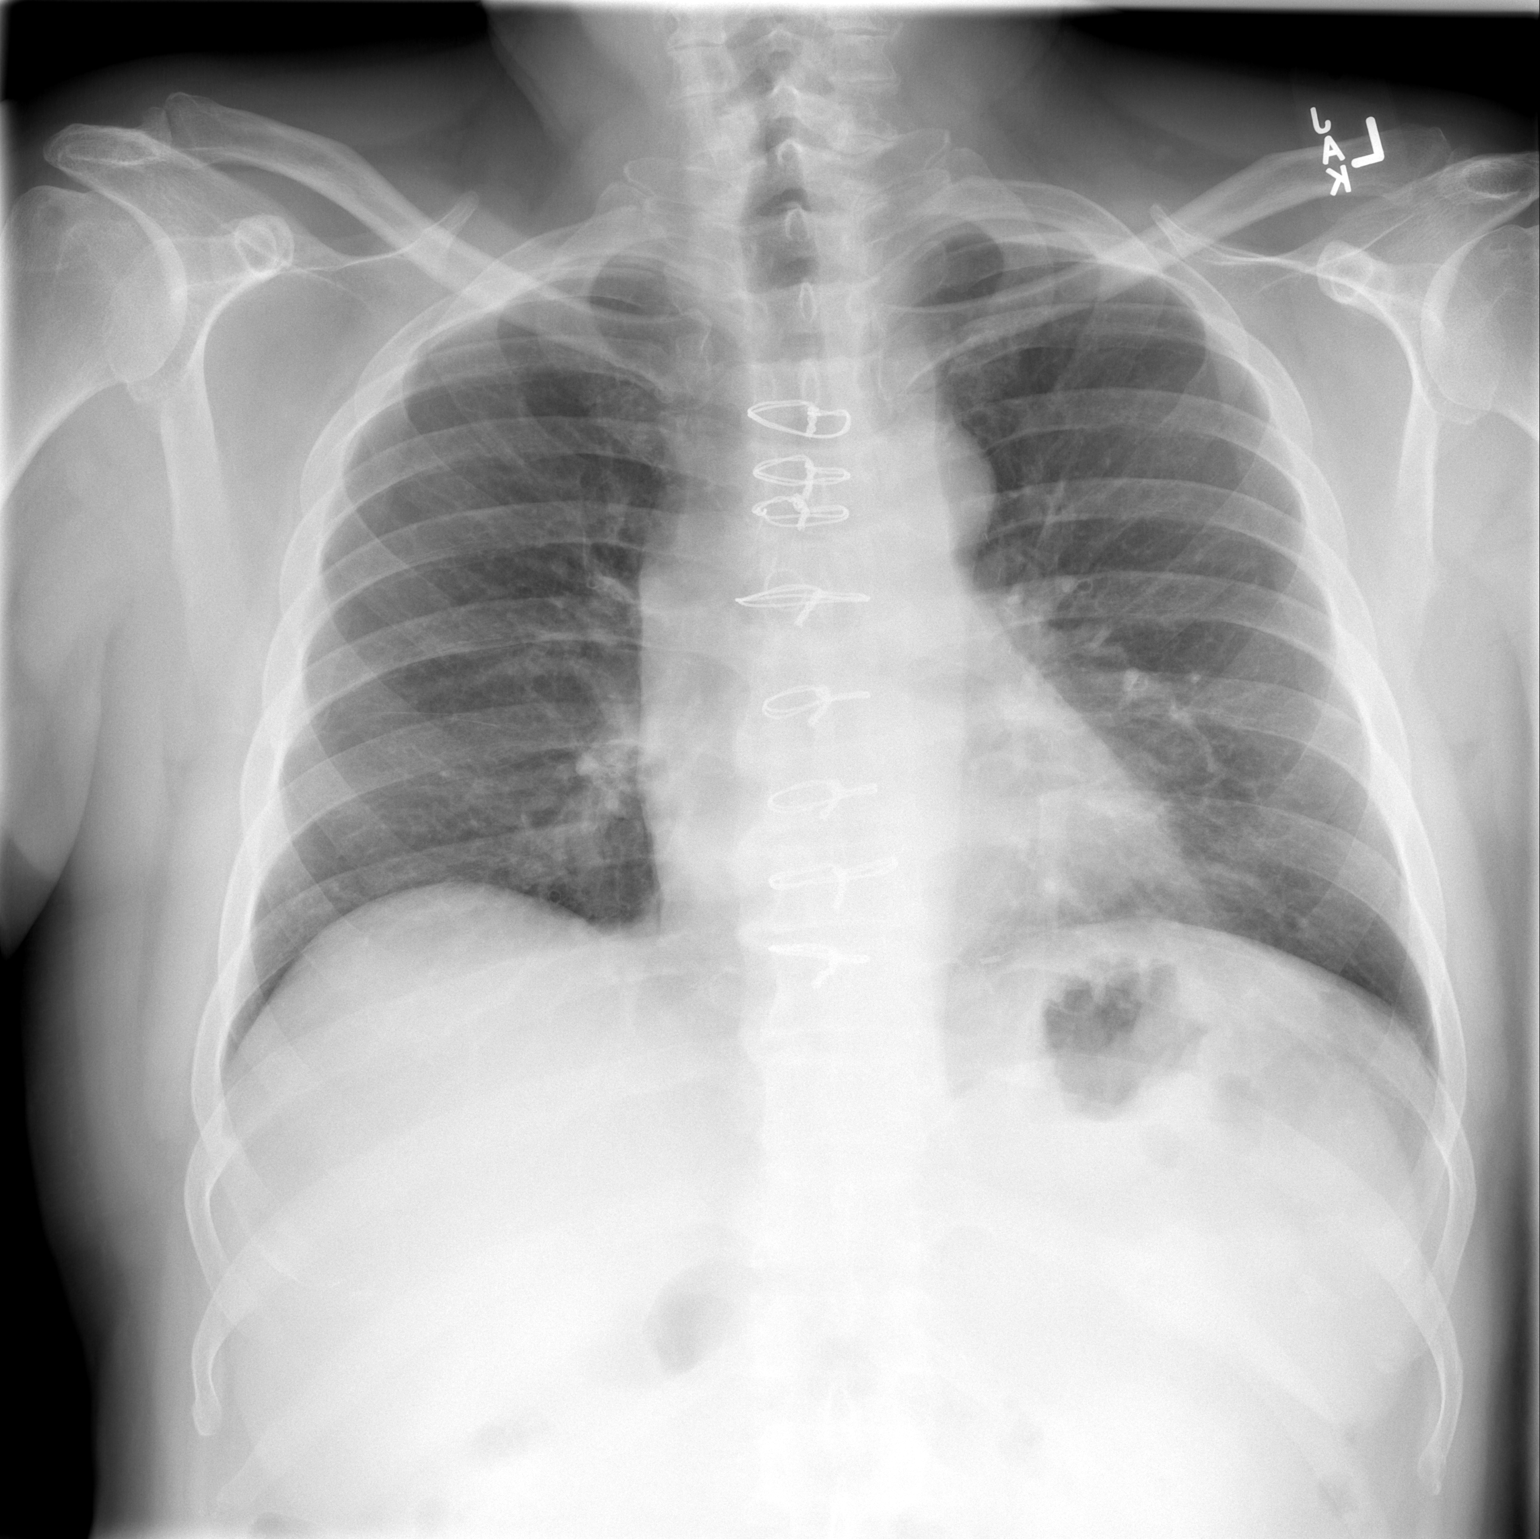

[w chest lat]
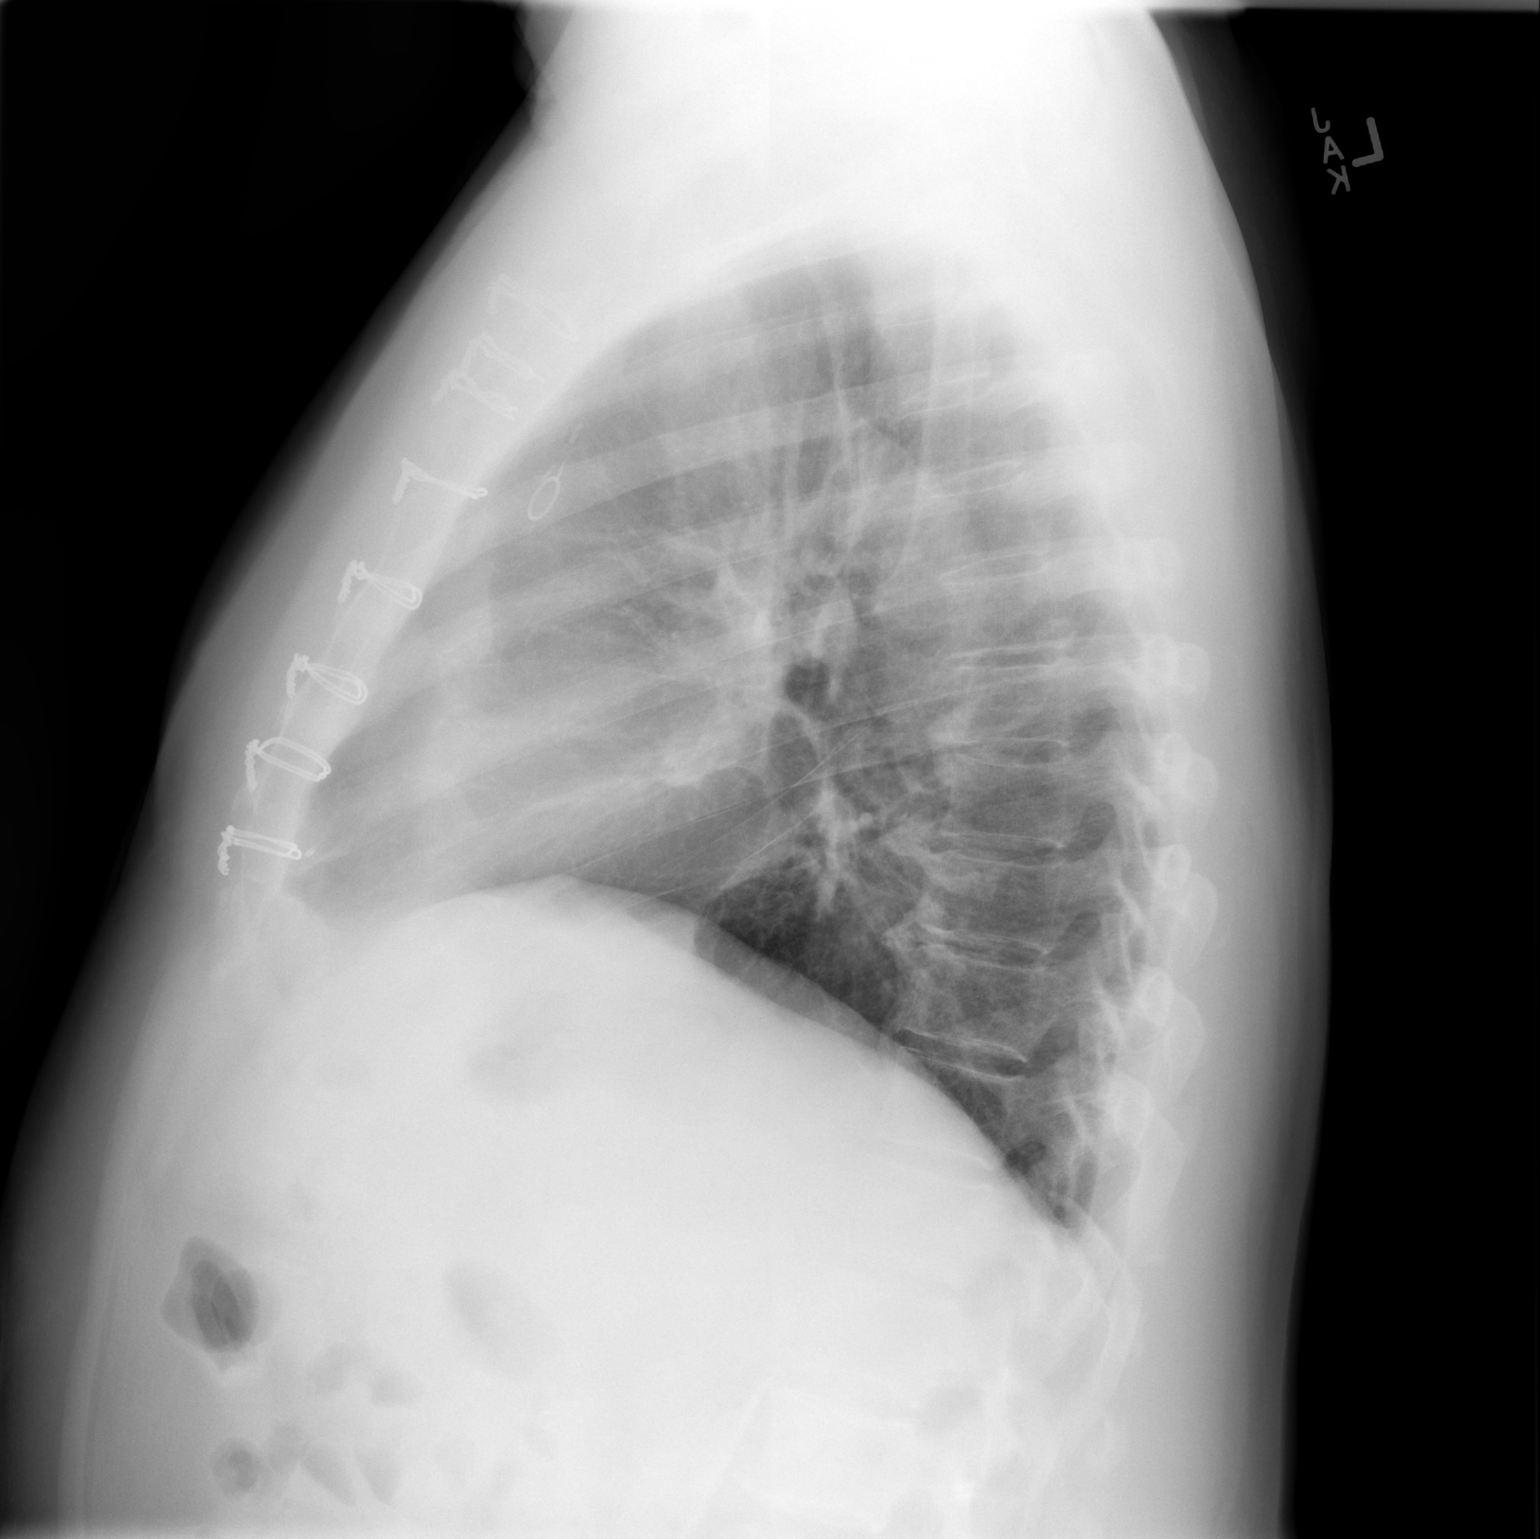

[2 of 2 positions shown; findings below may reference images not displayed]

FINDINGS: Patient is status post median sternotomy for CABG. Sternotomy wires
appear intact and stable alignment. Heart size is normal. Overall
cardiomediastinal silhouette is within normal limits in size and
configuration.

Perhaps mild scarring within the left perihilar lung. Lungs
otherwise clear. No evidence of pneumonia. No pleural effusion or
pneumothorax. Mild degenerative spurring noted within the thoracic
spine. No acute - appearing osseous abnormality.
IMPRESSION: No evidence of acute cardiopulmonary abnormality.

## 2018-10-05 ENCOUNTER — Other Ambulatory Visit: Payer: Self-pay | Admitting: Cardiology

## 2018-10-13 ENCOUNTER — Ambulatory Visit: Payer: Medicare Other | Admitting: Cardiology

## 2018-12-09 ENCOUNTER — Telehealth: Payer: Self-pay | Admitting: *Deleted

## 2018-12-09 NOTE — Telephone Encounter (Signed)
..   Virtual Visit Pre-Appointment Phone Call  "(Name), I am calling you today to discuss your upcoming appointment. We are currently trying to limit exposure to the virus that causes COVID-19 by seeing patients at home rather than in the office."  1. "What is the BEST phone number to call the day of the visit?" - include this in appointment notes  2. "Do you have or have access to (through a family member/friend) a smartphone with video capability that we can use for your visit?" a. If yes - list this number in appt notes as "cell" (if different from BEST phone #) and list the appointment type as a VIDEO visit in appointment notes b. If no - list the appointment type as a PHONE visit in appointment notes  3. Confirm consent - "In the setting of the current Covid19 crisis, you are scheduled for a (phone or video) visit with your provider on (date) at (time).  Just as we do with many in-office visits, in order for you to participate in this visit, we must obtain consent.  If you'd like, I can send this to your mychart (if signed up) or email for you to review.  Otherwise, I can obtain your verbal consent now.  All virtual visits are billed to your insurance company just like a normal visit would be.  By agreeing to a virtual visit, we'd like you to understand that the technology does not allow for your provider to perform an examination, and thus may limit your provider's ability to fully assess your condition. If your provider identifies any concerns that need to be evaluated in person, we will make arrangements to do so.  Finally, though the technology is pretty good, we cannot assure that it will always work on either your or our end, and in the setting of a video visit, we may have to convert it to a phone-only visit.  In either situation, we cannot ensure that we have a secure connection.  Are you willing to proceed?" STAFF: Did the patient verbally acknowledge consent to telehealth visit? Document  YES/NO here: YES  4. Advise patient to be prepared - "Two hours prior to your appointment, go ahead and check your blood pressure, pulse, oxygen saturation, and your weight (if you have the equipment to check those) and write them all down. When your visit starts, your provider will ask you for this information. If you have an Apple Watch or Kardia device, please plan to have heart rate information ready on the day of your appointment. Please have a pen and paper handy nearby the day of the visit as well."  5. Give patient instructions for MyChart download to smartphone OR [email or web address] as below if video visit (depending on what platform provider is using)EXPLAINED PROCESS AND PT STATED HIS UNDERSTANDING  6. Inform patient they will receive a phone call 15 minutes prior to their appointment time (may be from unknown caller ID) so they should be prepared to answer PT AWARE    TELEPHONE CALL NOTE  DEMONTAE NEWBERG has been deemed a candidate for a follow-up tele-health visit to limit community exposure during the Covid-19 pandemic. I spoke with the patient via phone to ensure availability of phone/video source, confirm preferred email & phone number, and discuss instructions and expectations.  I reminded WASH MULLALY to be prepared with any vital sign and/or heart rhythm information that could potentially be obtained via home monitoring, at the time of his visit. I reminded Markeece  Tennis MustW [place] to expect a phone call prior to his visit.  Valrie Hartsley, Kendy Haston L, CMA 12/09/2018 3:30 PM   INSTRUCTIONS FOR DOWNLOADING THE MYCHART APP TO SMARTPHONE  - The patient must first make sure to have activated MyChart and know their login information - If Apple, go to [employer] Store and type in MyChart in the search bar and download the app. If Android, ask patient to go to [employer] Play Store and type in [place] in the search bar and download the app. The app is free but as with any other app downloads, their phone may require  them to verify saved payment information or Apple/Android password.  - The patient will need to then log into the app with their MyChart username and password, and select [facility] as their healthcare provider to link the account. When it is time for your visit, go to the MyChart app, find appointments, and click Begin Video Visit. Be sure to Select Allow for your device to access the Microphone and Camera for your visit. You will then be connected, and your provider will be with you shortly.  **If they have any issues connecting, or need assistance please contact MyChart service desk [phone])**  **If using a computer, in order to ensure the best quality for their visit they will need to use either of the following Internet Browsers: [employer] Edge, or Google Chrome**  IF USING DOXIMITY or DOXY.ME - The patient will receive a link just prior to their visit by text.     FULL LENGTH CONSENT FOR TELE-HEALTH VISIT   I hereby voluntarily request, consent and authorize [facility] and its employed or contracted physicians, physician assistants, nurse practitioners or other licensed health care professionals (the Practitioner), to provide me with telemedicine health care services (the "Services") as deemed necessary by the treating Practitioner. I acknowledge and consent to receive the Services by the Practitioner via telemedicine. I understand that the telemedicine visit will involve communicating with the Practitioner through live audiovisual communication technology and the disclosure of certain medical information by electronic transmission. I acknowledge that I have been given the opportunity to request an in-person assessment or other available alternative prior to the telemedicine visit and am voluntarily participating in the telemedicine visit.  I understand that I have the right to withhold or withdraw my consent to the use of telemedicine in the course of my care at any  time, without affecting my right to future care or treatment, and that the Practitioner or I may terminate the telemedicine visit at any time. I understand that I have the right to inspect all information obtained and/or recorded in the course of the telemedicine visit and may receive copies of available information for a reasonable fee.  I understand that some of the potential risks of receiving the Services via telemedicine include:  [name]. Delay or interruption in medical evaluation due to technological equipment failure or disruption; . Information transmitted may not be sufficient (e.g. poor resolution of images) to allow for appropriate medical decision making by the Practitioner; and/or  . In rare instances, security protocols could fail, causing a breach of personal health information.  Furthermore, I acknowledge that it is my responsibility to provide information about my medical history, conditions and care that is complete and accurate to the best of my ability. I acknowledge that Practitioner's advice, recommendations, and/or decision may be based on factors not within their control, such as incomplete or inaccurate data provided by me or distortions of diagnostic  images or specimens that may result from electronic transmissions. I understand that the practice of medicine is not an exact science and that Practitioner makes no warranties or guarantees regarding treatment outcomes. I acknowledge that I will receive a copy of this consent concurrently upon execution via email to the email address I last provided but may also request a printed copy by calling the office of [facility].    I understand that my insurance will be billed for this visit.   I have read or had this consent read to me. . I understand the contents of this consent, which adequately explains the benefits and risks of the Services being provided via telemedicine.  . I have been provided ample opportunity to ask questions  regarding this consent and the Services and have had my questions answered to my satisfaction. . I give my informed consent for the services to be provided through the use of telemedicine in my medical care  By participating in this telemedicine visit I agree to the above.

## 2018-12-11 ENCOUNTER — Other Ambulatory Visit: Payer: Self-pay

## 2018-12-11 ENCOUNTER — Telehealth (INDEPENDENT_AMBULATORY_CARE_PROVIDER_SITE_OTHER): Payer: Medicare Other | Admitting: Cardiology

## 2018-12-11 ENCOUNTER — Encounter: Payer: Self-pay | Admitting: Cardiology

## 2018-12-11 VITALS — BP 139/74 | HR 62 | Temp 96.3°F | Ht 66.0 in | Wt 215.0 lb

## 2018-12-11 DIAGNOSIS — E785 Hyperlipidemia, unspecified: Secondary | ICD-10-CM

## 2018-12-11 DIAGNOSIS — N183 Chronic kidney disease, stage 3 unspecified: Secondary | ICD-10-CM

## 2018-12-11 DIAGNOSIS — Z951 Presence of aortocoronary bypass graft: Secondary | ICD-10-CM

## 2018-12-11 DIAGNOSIS — I129 Hypertensive chronic kidney disease with stage 1 through stage 4 chronic kidney disease, or unspecified chronic kidney disease: Secondary | ICD-10-CM

## 2018-12-11 DIAGNOSIS — Z955 Presence of coronary angioplasty implant and graft: Secondary | ICD-10-CM

## 2018-12-11 DIAGNOSIS — Z7189 Other specified counseling: Secondary | ICD-10-CM

## 2018-12-11 DIAGNOSIS — I2510 Atherosclerotic heart disease of native coronary artery without angina pectoris: Secondary | ICD-10-CM | POA: Diagnosis not present

## 2018-12-11 DIAGNOSIS — I10 Essential (primary) hypertension: Secondary | ICD-10-CM

## 2018-12-11 NOTE — Progress Notes (Signed)
Virtual Visit via Video Note   This visit type was conducted due to national recommendations for restrictions regarding the COVID-19 Pandemic (e.g. social distancing) in an effort to limit this patient's exposure and mitigate transmission in our community.  Due to his co-morbid illnesses, this patient is at least at moderate risk for complications without adequate follow up.  This format is felt to be most appropriate for this patient at this time.  All issues noted in this document were discussed and addressed.  A limited physical exam was performed with this format.  Please refer to the patient's chart for his consent to telehealth for [facility].  Evaluation Performed:  Follow-up visit  This visit type was conducted due to national recommendations for restrictions regarding the COVID-19 Pandemic (e.g. social distancing).  This format is felt to be most appropriate for this patient at this time.  All issues noted in this document were discussed and addressed.  No physical exam was performed (except for noted visual exam findings with Video Visits).  Please refer to the patient's chart (MyChart message for video visits and phone note for telephone visits) for the patient's consent to telehealth for [facility].  Date:  12/11/2018   ID:  [name] [name], [name] [name] [birth year], MRN [id number]  Patient Location:  Home  Provider location:   [place]  PCP:  Marden Noble, MD  Cardiologist:  Armanda Magic, MD Electrophysiologist:  None   Chief Complaint:  CAD, HTN  History of Present Illness:    [name] [name] is a 73 y.o. male who presents via audio/video conferencing for a telehealth visit today.    [name] [name] is a 73 y.o. male with a hx of ASCAD.  He initially presented with chest pain and underwent myoview which was neg for ischemia but Chest pain continued. Cardiac CT showed coronary calcium score 1951 and severe >70 % stenosis in prox LAD. Possible significant disease in the ostial  RCA and LCX. It was felt that he could have balanced ischemia on nuc. Cath showed severe CAD and underwent CABG by Dr. Cornelius Moras 11/04/15 with LIMA to distal LAD, SVG to 1st OM of LCx, seq VG to 2nd OM of LCX. He had normal LV systolic function.   He is here today for followup and is doing well.  He denies any chest pain or pressure, SOB, DOE, PND, orthopnea, LE edema, dizziness, palpitations or syncope. He is compliant with his meds and is tolerating meds with no SE.    The patient does not have symptoms concerning for COVID-19 infection (fever, chills, cough, or new shortness of breath).    Prior CV studies:   The following studies were reviewed today:  labs  Past Medical History:  Diagnosis Date  . BPH associated with nocturia   . CAD (coronary artery disease), native coronary artery    CARDIOLOGIST-  DR Gloris Manchester --  normal myoview 03/ 2017 but Cardiac CT 04/ 2017 calcium score 1951, >70% LAD stenosis possible disease RCA , LCX--- cath done showed severe 3V disease S/P  CABG 04/ 2017  . Chronic back pain    herniated disc  . CKD (chronic kidney disease), stage III (HCC)   . Complication of anesthesia    POST-OP URINARY RETENTION 08/ 2013  . Diverticulosis   . Gout    as of 03-20-2017 per pt stable  . History of kidney stones    METABOLIC STONE DISEASE  . History of urinary retention    POST OP SURGERY  08/ 2013  . Hypercalciuria   . Hyperlipidemia    takes Crestor daily  . Hypertension   . LBBB (left bundle branch block)   . Muscle cramps    legs occasonally in am  . Renal calculus, right   . Renal cyst, right   . 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 Seasonal allergies    Past Surgical History:  Procedure Laterality Date  . CARDIAC CATHETERIZATION N/A 10/28/2015   Procedure: Left Heart Cath and Coronary Angiography;  Surgeon: [name], MD;  Location: [facility] INVASIVE CV LAB;  Service: Cardiovascular;  Laterality: N/A;   . CARDIAC CATHETERIZATION  05-21-1995  dr Verdis Prime   non-obstructive cad;  normal lvf  . CARDIOVASCULAR STRESS TEST  09/23/2015  dr Gloris [place]    Normal nuclear study w/ no prior infarct and no ischemia/  normal LV function and wall motion , nuclear stress ef 54%  . COLONOSCOPY  last one 2009  . CORONARY ARTERY BYPASS GRAFT N/A 11/04/2015   Procedure: CORONARY ARTERY BYPASS GRAFTING (CABG) times three using left internal mammary artery and right saphenous vein harvested with endoscope.;  Surgeon: [name], MD;  Location: [facility] OR;  Service: Open Heart Surgery;  Laterality: N/A;  . CYSTOSCOPY WITH RETROGRADE PYELOGRAM, URETEROSCOPY AND STENT PLACEMENT Left 03/09/2017   Procedure: CYSTOSCOPY WITH RETROGRADE PYELOGRAM, URETEROSCOPY AND STENT PLACEMENT;  Surgeon: Sebastian Ache, MD;  Location: [facility] ORS;  Service: Urology;  Laterality: Left;  . CYSTOSCOPY WITH URETEROSCOPY  07/28/2012   Procedure: CYSTOSCOPY WITH URETEROSCOPY;  Surgeon: [name], MD;  Location: [facility];  Service: Urology;  Laterality: Left;     . CYSTOSCOPY/URETEROSCOPY/HOLMIUM LASER/STENT PLACEMENT Right 03/25/2017   Procedure: CYSTOSCOPY RIGHT URETEROSCOPYVHOLMIUM LASER STENT PLACEMENT;  Surgeon: Crista Elliot, MD;  Location: [facility];  Service: Urology;  Laterality: Right;  . ESOPHAGOGASTRODUODENOSCOPY (EGD) WITH ESOPHAGEAL DILATION  05/ 2018 approx.  [name] EXTRACORPOREAL SHOCK WAVE LITHOTRIPSY  multiple last one 05-29-2012  . FOOT SURGERY  1990s   right-removed bone spur   . HOLMIUM LASER APPLICATION  07/28/2012   Procedure: HOLMIUM LASER APPLICATION;  Surgeon: [name], MD;  Location: [facility];  Service: Urology;  Laterality: Left;  left lithotripsy  . HOLMIUM LASER APPLICATION Left 03/09/2017   Procedure: HOLMIUM LASER APPLICATION;  Surgeon: Sebastian Ache, MD;  Location: [facility];  Service: Urology;  Laterality: Left;  . POSTERIOR LUMBAR FUSION  03/07/2012   w/ laminecotmy L4-5  . PYELOLITHOTOMY Right  01/1982  . TEE WITHOUT CARDIOVERSION N/A 11/04/2015   Procedure: TRANSESOPHAGEAL ECHOCARDIOGRAM (TEE);  Surgeon: [name], MD;  Location: [facility] OR;  Service: Open Heart Surgery;  Laterality: N/A;  . TONSILLECTOMY  age 2  . URETEROLITHOTOMY Right 03/1994     Current Meds  Medication Sig  . allopurinol (ZYLOPRIM) 100 MG tablet Take 100 mg by mouth 2 (two) times daily.   [name] aspirin EC 81 MG EC tablet Take 1 tablet (81 mg total) by mouth daily.  . cholecalciferol (VITAMIN D) 1000 UNITS tablet Take 1,000 Units by mouth every morning.   . Coenzyme Q10 (COQ10) 100 MG CAPS Take 1 capsule by mouth every evening.   . ezetimibe (ZETIA) 10 MG tablet Take 1 tablet (10 mg total) by mouth daily. Please keep upcoming appt for future refills.  . fexofenadine (ALLEGRA) 180 MG tablet Take 180 mg by mouth daily as needed. (allergies)  . metoprolol tartrate (  LOPRESSOR) 25 MG tablet TAKE 1 TABLET BY MOUTH TWICE DAILY  . Multiple Vitamin (MULTIVITAMIN WITH MINERALS) TABS Take 1 tablet by mouth every morning.   [name] omeprazole (PRILOSEC) 20 MG capsule Take 20 mg by mouth every evening.  [name] oxybutynin (DITROPAN-XL) 5 MG 24 hr tablet Take 5 mg by mouth at bedtime.  . pravastatin (PRAVACHOL) 40 MG tablet TK 1 T PO QPM  . Tamsulosin HCl (FLOMAX) 0.4 MG CAPS Take 0.4 mg by mouth daily after supper.  . telmisartan (MICARDIS) 20 MG tablet Take 1 tablet (20 mg total) by mouth every morning.     Allergies:   Atorvastatin; Hctz [hydrochlorothiazide]; and Rosuvastatin   Social History   Tobacco Use  . Smoking status: Never Smoker  . Smokeless tobacco: Never Used  Substance Use Topics  . Alcohol use: No    Alcohol/week: 0.0 standard drinks  . Drug use: No     Family Hx: The patient's family history includes Dementia in his mother; Prostate cancer in his father.  ROS:   Please see the history of present illness.     All other systems reviewed and are negative.   Labs/Other Tests and Data Reviewed:     Recent Labs: No results found for requested labs within last 8760 hours.   Recent Lipid Panel Lab Results  Component Value Date/Time   CHOL 137 09/11/2017 09:54 AM   TRIG 157 (H) 09/11/2017 09:54 AM   HDL 44 09/11/2017 09:54 AM   CHOLHDL 3.1 09/11/2017 09:54 AM   CHOLHDL 3.4 12/19/2015 10:05 AM   LDLCALC 62 09/11/2017 09:54 AM    Wt Readings from Last 3 Encounters:  12/11/18 215 lb (97.5 kg)  09/06/17 219 lb (99.3 kg)  03/25/17 209 lb 8 oz (95 kg)     Objective:    Vital Signs:  BP 139/74   Pulse 62   Temp (!) 96.3 F (35.7 C)   Ht  (1.676 m)   Wt 215 lb (97.5 kg)   BMI 34.70 kg/m    CONSTITUTIONAL:  Well nourished, well developed male in no acute distress.  EYES: anicteric MOUTH: oral mucosa is pink RESPIRATORY: Normal respiratory effort, symmetric expansion CARDIOVASCULAR: No peripheral edema SKIN: No rash, lesions or ulcers MUSCULOSKELETAL: no digital cyanosis NEURO: Cranial Nerves II-XII grossly intact, moves all extremities PSYCH: Intact judgement and insight.  A&O x 3, Mood/affect appropriate   ASSESSMENT & PLAN:    1.  ASCAD - cath 2017 showed 80% mid left circumflex, 80% ostial LAD, 60% left main, 75% ostial left circumflex, 75% ramus, 40% ostial RCA.  He is status post CABG with LIMA to the LAD, SVG to 1st OM of LCx, seq VG to 2nd OM of LCX .  He has not had any anginal symptoms since I saw him a year ago.  He will continue on aspirin 81 mg daily, Lopressor 25 mg twice daily and pravastatin 40 mg.  2.  Hypertension -his blood pressures been well controlled at home.  He will continue on Lopressor 25 mg twice daily and telmisartan 20 mg daily.  His creatinine was stable at 1.55 a year ago.  I will repeat a bmet as well as an FLP and ALT.  3.  Hyperlipidemia -his LDL goal is less than 70.  He will continue on pravastatin 40 mg daily and zetia  daily.  I will repeat an FLP and ALT.  4.  Chronic kidney disease stage III -this is followed by his PCP.   His  last creatinine was 1.55.  5.  COVID-19 Education:The signs and symptoms of COVID-19 were discussed with the patient and how to seek care for testing (follow up with PCP or arrange E-visit).  The importance of social distancing was discussed today.  Patient Risk:   After full review of this patient's clinical status, I feel that they are at least moderate risk at this time.  Time:   Today, I have spent 10 minutes directly with the patient on video discussing medical problems including CAD, HTN, lipids.  We also reviewed the symptoms of COVID 19 and the ways to protect against contracting the virus with telehealth technology.  I spent an additional 5 minutes reviewing patient's chart including labs.  Medication Adjustments/Labs and Tests Ordered: Current medicines are reviewed at length with the patient today.  Concerns regarding medicines are outlined above.  Tests Ordered: No orders of the defined types were placed in this encounter.  Medication Changes: No orders of the defined types were placed in this encounter.   Disposition:  Follow up in 1 year(s)  Signed, Armanda Magicraci , MD  12/11/2018 1:13 PM    [facility]

## 2018-12-11 NOTE — Patient Instructions (Signed)
Medication Instructions:  Your physician recommends that you continue on your current medications as directed. Please refer to the Current Medication list given to you today.  If you need a refill on your cardiac medications before your next appointment, please call your pharmacy.   Lab work: Fasting Labs: Lipid, Liver and BMET on 01/23/19  If you have labs (blood work) drawn today and your tests are completely normal, you will receive your results only by: [name] MyChart Message (if you have MyChart) OR . A paper copy in the mail If you have any lab test that is abnormal or we need to change your treatment, we will call you to review the results.  Testing/Procedures: None  Follow-Up: At [facility], you and your health needs are our priority.  As part of our continuing mission to provide you with exceptional heart care, we have created designated Provider Care Teams.  These Care Teams include your primary Cardiologist (physician) and Advanced Practice Providers (APPs -  Physician Assistants and Nurse Practitioners) who all work together to provide you with the care you need, when you need it. You will need a follow up appointment in 1 years.  Please call our office 2 months in advance to schedule this appointment.  You may see Dr. Mayford Knife or one of the following Advanced Practice Providers on your designated Care Team:   [place], PA-C Ronie Spies, PA-C . Jacolyn Reedy, PA-C

## 2018-12-20 ENCOUNTER — Encounter (HOSPITAL_COMMUNITY)
Admission: EM | Disposition: A | Discharge status: Home / Self Care | Payer: Self-pay | Source: Home / Self Care | Attending: Emergency Medicine

## 2018-12-20 ENCOUNTER — Observation Stay (HOSPITAL_COMMUNITY)
Admission: EM | Admit: 2018-12-20 | Discharge: 2018-12-21 | Disposition: A | Discharge status: Home / Self Care | Payer: Medicare Other | Attending: General Surgery | Admitting: General Surgery

## 2018-12-20 ENCOUNTER — Encounter (HOSPITAL_COMMUNITY): Payer: Self-pay

## 2018-12-20 ENCOUNTER — Other Ambulatory Visit: Payer: Self-pay

## 2018-12-20 ENCOUNTER — Observation Stay (HOSPITAL_COMMUNITY): Payer: Medicare Other | Admitting: Anesthesiology

## 2018-12-20 ENCOUNTER — Emergency Department (HOSPITAL_COMMUNITY): Payer: Medicare Other

## 2018-12-20 DIAGNOSIS — Z6835 Body mass index (BMI) 35.0-35.9, adult: Secondary | ICD-10-CM | POA: Insufficient documentation

## 2018-12-20 DIAGNOSIS — R109 Unspecified abdominal pain: Secondary | ICD-10-CM

## 2018-12-20 DIAGNOSIS — K3580 Unspecified acute appendicitis: Principal | ICD-10-CM | POA: Insufficient documentation

## 2018-12-20 DIAGNOSIS — I2510 Atherosclerotic heart disease of native coronary artery without angina pectoris: Secondary | ICD-10-CM | POA: Diagnosis not present

## 2018-12-20 DIAGNOSIS — I129 Hypertensive chronic kidney disease with stage 1 through stage 4 chronic kidney disease, or unspecified chronic kidney disease: Secondary | ICD-10-CM | POA: Insufficient documentation

## 2018-12-20 DIAGNOSIS — Z951 Presence of aortocoronary bypass graft: Secondary | ICD-10-CM | POA: Diagnosis not present

## 2018-12-20 DIAGNOSIS — E785 Hyperlipidemia, unspecified: Secondary | ICD-10-CM | POA: Diagnosis not present

## 2018-12-20 DIAGNOSIS — N183 Chronic kidney disease, stage 3 (moderate): Secondary | ICD-10-CM | POA: Diagnosis not present

## 2018-12-20 DIAGNOSIS — Z1159 Encounter for screening for other viral diseases: Secondary | ICD-10-CM | POA: Insufficient documentation

## 2018-12-20 DIAGNOSIS — K3533 Acute appendicitis with perforation and localized peritonitis, with abscess: Secondary | ICD-10-CM

## 2018-12-20 HISTORY — PX: LAPAROSCOPIC APPENDECTOMY: SHX408

## 2018-12-20 LAB — COMPREHENSIVE METABOLIC PANEL
ALT: 19 U/L (ref 0–44)
AST: 21 U/L (ref 15–41)
Albumin: 4.2 g/dL (ref 3.5–5.0)
Alkaline Phosphatase: 82 U/L (ref 38–126)
Anion gap: 6 (ref 5–15)
BUN: 23 mg/dL (ref 8–23)
CO2: 23 mmol/L (ref 22–32)
Calcium: 9.2 mg/dL (ref 8.9–10.3)
Chloride: 108 mmol/L (ref 98–111)
Creatinine, Ser: 1.49 mg/dL — ABNORMAL HIGH (ref 0.61–1.24)
GFR calc Af Amer: 54 mL/min — ABNORMAL LOW (ref >60)
GFR calc non Af Amer: 46 mL/min — ABNORMAL LOW (ref >60)
Glucose, Bld: 114 mg/dL — ABNORMAL HIGH (ref 70–99)
Potassium: 3.8 mmol/L (ref 3.5–5.1)
Sodium: 137 mmol/L (ref 135–145)
Total Bilirubin: 0.8 mg/dL (ref 0.3–1.2)
Total Protein: 7.9 g/dL (ref 6.5–8.1)

## 2018-12-20 LAB — CBC
HCT: 42.6 % (ref 39.0–52.0)
Hemoglobin: 13.6 g/dL (ref 13.0–17.0)
MCH: 28.9 pg (ref 26.0–34.0)
MCHC: 31.9 g/dL (ref 30.0–36.0)
MCV: 90.6 fL (ref 80.0–100.0)
Platelets: 138 10*3/uL — ABNORMAL LOW (ref 150–400)
RBC: 4.7 MIL/uL (ref 4.22–5.81)
RDW: 14.9 % (ref 11.5–15.5)
WBC: 12.6 10*3/uL — ABNORMAL HIGH (ref 4.0–10.5)
nRBC: 0 % (ref 0.0–0.2)

## 2018-12-20 LAB — LIPASE, BLOOD: Lipase: 41 U/L (ref 11–51)

## 2018-12-20 LAB — LACTIC ACID, PLASMA: Lactic Acid, Venous: 1.7 mmol/L (ref 0.5–1.9)

## 2018-12-20 LAB — SARS CORONAVIRUS 2 BY RT PCR (HOSPITAL ORDER, PERFORMED IN ~~LOC~~ HOSPITAL LAB): SARS Coronavirus 2: NEGATIVE

## 2018-12-20 SURGERY — APPENDECTOMY, LAPAROSCOPIC
Anesthesia: General | Site: Abdomen

## 2018-12-20 MED ORDER — ONDANSETRON HCL 4 MG/2ML IJ SOLN: 4.0000 mg | Freq: Four times a day (QID) | INTRAMUSCULAR | Status: DC | PRN

## 2018-12-20 MED ORDER — LACTATED RINGERS IV SOLN: INTRAVENOUS | Status: DC | PRN

## 2018-12-20 MED ORDER — SODIUM CHLORIDE 0.9 % IV SOLN
INTRAVENOUS | Status: DC | PRN
  Administered 2018-12-20: 15:00:00 via INTRAVENOUS

## 2018-12-20 MED ORDER — OXYCODONE HCL 5 MG PO TABS: 5.0000 mg | ORAL_TABLET | ORAL | Status: DC | PRN

## 2018-12-20 MED ORDER — FENTANYL CITRATE (PF) 100 MCG/2ML IJ SOLN: 25.0000 ug | INTRAMUSCULAR | Status: DC | PRN

## 2018-12-20 MED ORDER — KETOROLAC TROMETHAMINE 30 MG/ML IJ SOLN: 30.0000 mg | Freq: Four times a day (QID) | INTRAMUSCULAR | Status: DC | PRN

## 2018-12-20 MED ORDER — LACTATED RINGERS IV SOLN
INTRAVENOUS | Status: AC | PRN
  Administered 2018-12-20: 1000 mL

## 2018-12-20 MED ORDER — ROCURONIUM BROMIDE 50 MG/5ML IV SOSY
PREFILLED_SYRINGE | INTRAVENOUS | Status: DC | PRN
  Administered 2018-12-20: 40 mg via INTRAVENOUS

## 2018-12-20 MED ORDER — PROPOFOL 10 MG/ML IV BOLUS
INTRAVENOUS | Status: DC | PRN
  Administered 2018-12-20: 110 mg via INTRAVENOUS

## 2018-12-20 MED ORDER — SODIUM CHLORIDE 0.9 % IV SOLN: INTRAVENOUS | Status: DC

## 2018-12-20 MED ORDER — METOPROLOL TARTRATE 25 MG PO TABS
25.0000 mg | ORAL_TABLET | Freq: Two times a day (BID) | ORAL | Status: DC
  Administered 2018-12-20 – 2018-12-21 (×2): 25 mg via ORAL
  Filled 2018-12-20 (×2): qty 1

## 2018-12-20 MED ORDER — ACETAMINOPHEN 10 MG/ML IV SOLN
INTRAVENOUS | Status: AC
  Filled 2018-12-20: qty 100

## 2018-12-20 MED ORDER — DEXTROSE-NACL 5-0.9 % IV SOLN
INTRAVENOUS | Status: DC
  Administered 2018-12-20: 17:00:00 via INTRAVENOUS

## 2018-12-20 MED ORDER — ONDANSETRON 4 MG PO TBDP: 4.0000 mg | ORAL_TABLET | Freq: Four times a day (QID) | ORAL | Status: DC | PRN

## 2018-12-20 MED ORDER — ONDANSETRON HCL 4 MG/2ML IJ SOLN
INTRAMUSCULAR | Status: AC
  Filled 2018-12-20: qty 2

## 2018-12-20 MED ORDER — OXYBUTYNIN CHLORIDE ER 5 MG PO TB24
5.0000 mg | ORAL_TABLET | Freq: Every day | ORAL | Status: DC
  Administered 2018-12-20: 5 mg via ORAL
  Filled 2018-12-20: qty 1

## 2018-12-20 MED ORDER — DIPHENHYDRAMINE HCL 50 MG/ML IJ SOLN
INTRAMUSCULAR | Status: AC
  Filled 2018-12-20: qty 1

## 2018-12-20 MED ORDER — 0.9 % SODIUM CHLORIDE (POUR BTL) OPTIME
TOPICAL | Status: DC | PRN
  Administered 2018-12-20: 16:00:00 1000 mL

## 2018-12-20 MED ORDER — PROPOFOL 10 MG/ML IV BOLUS
INTRAVENOUS | Status: AC
  Filled 2018-12-20: qty 20

## 2018-12-20 MED ORDER — IOHEXOL 300 MG/ML  SOLN
100.0000 mL | Freq: Once | INTRAMUSCULAR | Status: AC | PRN
  Administered 2018-12-20: 100 mL via INTRAVENOUS

## 2018-12-20 MED ORDER — ONDANSETRON HCL 4 MG/2ML IJ SOLN
INTRAMUSCULAR | Status: DC | PRN
  Administered 2018-12-20: 4 mg via INTRAVENOUS

## 2018-12-20 MED ORDER — SODIUM CHLORIDE 0.9 % IV BOLUS
1000.0000 mL | Freq: Once | INTRAVENOUS | Status: AC
  Administered 2018-12-20: 1000 mL via INTRAVENOUS

## 2018-12-20 MED ORDER — LIDOCAINE 2% (20 MG/ML) 5 ML SYRINGE
INTRAMUSCULAR | Status: DC | PRN
  Administered 2018-12-20: 60 mg via INTRAVENOUS

## 2018-12-20 MED ORDER — DIPHENHYDRAMINE HCL 50 MG/ML IJ SOLN
INTRAMUSCULAR | Status: DC | PRN
  Administered 2018-12-20: 12.5 mg via INTRAVENOUS

## 2018-12-20 MED ORDER — LIDOCAINE 2% (20 MG/ML) 5 ML SYRINGE
INTRAMUSCULAR | Status: AC
  Filled 2018-12-20: qty 5

## 2018-12-20 MED ORDER — ACETAMINOPHEN 10 MG/ML IV SOLN
INTRAVENOUS | Status: DC | PRN
  Administered 2018-12-20: 1000 mg via INTRAVENOUS

## 2018-12-20 MED ORDER — DEXAMETHASONE SODIUM PHOSPHATE 10 MG/ML IJ SOLN
INTRAMUSCULAR | Status: DC | PRN
  Administered 2018-12-20: 5 mg via INTRAVENOUS

## 2018-12-20 MED ORDER — METRONIDAZOLE IN NACL 5-0.79 MG/ML-% IV SOLN
500.0000 mg | Freq: Once | INTRAVENOUS | Status: AC
  Administered 2018-12-20: 500 mg via INTRAVENOUS
  Filled 2018-12-20: qty 100

## 2018-12-20 MED ORDER — FENTANYL CITRATE (PF) 100 MCG/2ML IJ SOLN
INTRAMUSCULAR | Status: DC | PRN
  Administered 2018-12-20: 50 ug via INTRAVENOUS
  Administered 2018-12-20: 100 ug via INTRAVENOUS

## 2018-12-20 MED ORDER — BUPIVACAINE-EPINEPHRINE 0.25% -1:200000 IJ SOLN
INTRAMUSCULAR | Status: DC | PRN
  Administered 2018-12-20: 8 mL

## 2018-12-20 MED ORDER — OXYCODONE HCL 5 MG PO TABS
5.0000 mg | ORAL_TABLET | Freq: Once | ORAL | Status: AC
  Administered 2018-12-20: 10:00:00 5 mg via ORAL
  Filled 2018-12-20: qty 1

## 2018-12-20 MED ORDER — SODIUM CHLORIDE (PF) 0.9 % IJ SOLN
INTRAMUSCULAR | Status: AC
  Filled 2018-12-20: qty 50

## 2018-12-20 MED ORDER — ALLOPURINOL 100 MG PO TABS
100.0000 mg | ORAL_TABLET | Freq: Two times a day (BID) | ORAL | Status: DC
  Administered 2018-12-20 – 2018-12-21 (×2): 100 mg via ORAL
  Filled 2018-12-20 (×2): qty 1

## 2018-12-20 MED ORDER — HYDROMORPHONE HCL 1 MG/ML IJ SOLN: 0.5000 mg | INTRAMUSCULAR | Status: DC | PRN

## 2018-12-20 MED ORDER — SUCCINYLCHOLINE CHLORIDE 200 MG/10ML IV SOSY
PREFILLED_SYRINGE | INTRAVENOUS | Status: DC | PRN
  Administered 2018-12-20: 120 mg via INTRAVENOUS

## 2018-12-20 MED ORDER — DEXTROSE-NACL 5-0.9 % IV SOLN
INTRAVENOUS | Status: DC
  Administered 2018-12-20: 16:00:00 via INTRAVENOUS

## 2018-12-20 MED ORDER — FENTANYL CITRATE (PF) 250 MCG/5ML IJ SOLN
INTRAMUSCULAR | Status: AC
  Filled 2018-12-20: qty 5

## 2018-12-20 MED ORDER — TRAMADOL HCL 50 MG PO TABS: 50.0000 mg | ORAL_TABLET | Freq: Four times a day (QID) | ORAL | Status: DC | PRN

## 2018-12-20 MED ORDER — ROCURONIUM BROMIDE 10 MG/ML (PF) SYRINGE
PREFILLED_SYRINGE | INTRAVENOUS | Status: AC
  Filled 2018-12-20: qty 10

## 2018-12-20 MED ORDER — HYDROMORPHONE HCL 1 MG/ML IJ SOLN
0.5000 mg | Freq: Once | INTRAMUSCULAR | Status: AC
  Administered 2018-12-20: 0.5 mg via INTRAVENOUS
  Filled 2018-12-20: qty 1

## 2018-12-20 MED ORDER — METOPROLOL TARTRATE 5 MG/5ML IV SOLN
INTRAVENOUS | Status: DC | PRN
  Administered 2018-12-20 (×5): 1 mg via INTRAVENOUS

## 2018-12-20 MED ORDER — SODIUM CHLORIDE 0.9 % IV BOLUS
500.0000 mL | Freq: Once | INTRAVENOUS | Status: AC
  Administered 2018-12-20: 500 mL via INTRAVENOUS

## 2018-12-20 MED ORDER — SODIUM CHLORIDE 0.9 % IV SOLN
2.0000 g | Freq: Once | INTRAVENOUS | Status: AC
  Administered 2018-12-20: 2 g via INTRAVENOUS
  Filled 2018-12-20: qty 20

## 2018-12-20 MED ORDER — PROMETHAZINE HCL 25 MG/ML IJ SOLN: 6.2500 mg | INTRAMUSCULAR | Status: DC | PRN

## 2018-12-20 MED ORDER — BUPIVACAINE-EPINEPHRINE (PF) 0.25% -1:200000 IJ SOLN
INTRAMUSCULAR | Status: AC
  Filled 2018-12-20: qty 30

## 2018-12-20 MED ORDER — TAMSULOSIN HCL 0.4 MG PO CAPS
0.4000 mg | ORAL_CAPSULE | Freq: Every day | ORAL | Status: DC
  Administered 2018-12-20: 0.4 mg via ORAL
  Filled 2018-12-20: qty 1

## 2018-12-20 MED ORDER — SUGAMMADEX SODIUM 200 MG/2ML IV SOLN
INTRAVENOUS | Status: DC | PRN
  Administered 2018-12-20: 200 mg via INTRAVENOUS

## 2018-12-20 MED ORDER — DEXAMETHASONE SODIUM PHOSPHATE 10 MG/ML IJ SOLN
INTRAMUSCULAR | Status: AC
  Filled 2018-12-20: qty 1

## 2018-12-20 MED ORDER — SUCCINYLCHOLINE CHLORIDE 200 MG/10ML IV SOSY
PREFILLED_SYRINGE | INTRAVENOUS | Status: AC
  Filled 2018-12-20: qty 10

## 2018-12-20 SURGICAL SUPPLY — 31 items
APPLIER CLIP 5 13 M/L LIGAMAX5 (MISCELLANEOUS)
APPLIER CLIP ROT 10 11.4 M/L (STAPLE)
CHLORAPREP W/TINT 26 (MISCELLANEOUS) ×3 IMPLANT
CLIP APPLIE 5 13 M/L LIGAMAX5 (MISCELLANEOUS) IMPLANT
CLIP APPLIE ROT 10 11.4 M/L (STAPLE) IMPLANT
CLIP VESOLOCK XL 6/CT (CLIP) IMPLANT
COVER TRANSDUCER ULTRASND (DRAPES) ×3 IMPLANT
COVER WAND RF STERILE (DRAPES) IMPLANT
DECANTER SPIKE VIAL GLASS SM (MISCELLANEOUS) ×3 IMPLANT
DERMABOND ADVANCED (GAUZE/BANDAGES/DRESSINGS) ×2
DERMABOND ADVANCED .7 DNX12 (GAUZE/BANDAGES/DRESSINGS) ×1 IMPLANT
DEVICE TROCAR PUNCTURE CLOSURE (ENDOMECHANICALS) ×3 IMPLANT
ELECT REM PT RETURN 15FT ADLT (MISCELLANEOUS) ×3 IMPLANT
ENDOLOOP SUT PDS II  0 18 (SUTURE)
ENDOLOOP SUT PDS II 0 18 (SUTURE) IMPLANT
GLOVE BIO SURGEON STRL SZ7.5 (GLOVE) ×3 IMPLANT
GOWN STRL REUS W/TWL XL LVL3 (GOWN DISPOSABLE) ×6 IMPLANT
KIT BASIN OR (CUSTOM PROCEDURE TRAY) ×3 IMPLANT
KIT TURNOVER KIT A (KITS) IMPLANT
NEEDLE INSUFFLATION 14GA 120MM (NEEDLE) ×3 IMPLANT
SCISSORS LAP 5X35 DISP (ENDOMECHANICALS) ×3 IMPLANT
SET IRRIG TUBING LAPAROSCOPIC (IRRIGATION / IRRIGATOR) ×3 IMPLANT
SET TUBE SMOKE EVAC HIGH FLOW (TUBING) ×3 IMPLANT
SLEEVE XCEL OPT CAN 5 100 (ENDOMECHANICALS) ×3 IMPLANT
SUT MNCRL AB 4-0 PS2 18 (SUTURE) ×3 IMPLANT
TOWEL OR 17X26 10 PK STRL BLUE (TOWEL DISPOSABLE) ×3 IMPLANT
TOWEL OR NON WOVEN STRL DISP B (DISPOSABLE) ×3 IMPLANT
TRAY FOLEY MTR SLVR 16FR STAT (SET/KITS/TRAYS/PACK) ×3 IMPLANT
TRAY LAPAROSCOPIC (CUSTOM PROCEDURE TRAY) ×3 IMPLANT
TROCAR BLADELESS OPT 5 100 (ENDOMECHANICALS) ×3 IMPLANT
TROCAR XCEL NON-BLD 11X100MML (ENDOMECHANICALS) ×3 IMPLANT

## 2018-12-20 NOTE — ED Notes (Signed)
I will take him to O.R. now.

## 2018-12-20 NOTE — Anesthesia Procedure Notes (Signed)
Procedure Name: Intubation Date/Time: 12/20/2018 3:03 PM Performed by: Montel Clock, CRNA Pre-anesthesia Checklist: Patient identified, Emergency Drugs available, Suction available, Patient being monitored and Timeout performed Patient Re-evaluated:Patient Re-evaluated prior to induction Oxygen Delivery Method: Circle system utilized Preoxygenation: Pre-oxygenation with 100% oxygen Induction Type: IV induction, Rapid sequence and Cricoid Pressure applied Ventilation: Mask ventilation without difficulty Laryngoscope Size: Mac and 3 Grade View: Grade II Tube type: Oral Tube size: 7.5 mm Number of attempts: 1 Airway Equipment and Method: Stylet Placement Confirmation: ETT inserted through vocal cords under direct vision,  positive ETCO2 and breath sounds checked- equal and bilateral Secured at: 23 cm Tube secured with: Tape Dental Injury: Teeth and Oropharynx as per pre-operative assessment  Comments: Grade 2 view with ETT easily passed. Initially viewing too high with grade 3 view,however, glottis lower than expected and easily viewed after dropping view down.

## 2018-12-20 NOTE — Anesthesia Preprocedure Evaluation (Addendum)
Anesthesia Evaluation  Patient identified by MRN, date of birth, ID band Patient awake    Reviewed: Allergy & Precautions, NPO status , Patient's Chart, lab work & pertinent test results, reviewed documented beta blocker date and time   History of Anesthesia Complications Negative for: history of anesthetic complications  Airway Mallampati: II  TM Distance: >3 FB Neck ROM: Full    Dental no notable dental hx. (+) Dental Advisory Given   Pulmonary neg pulmonary ROS,    Pulmonary exam normal        Cardiovascular hypertension, Pt. on medications and Pt. on home beta blockers (-) angina+ CAD and + CABG  Normal cardiovascular exam+ dysrhythmias      Neuro/Psych negative neurological ROS     GI/Hepatic GERD  ,  Endo/Other  Morbid obesity  Renal/GU Renal disease     Musculoskeletal   Abdominal   Peds  Hematology   Anesthesia Other Findings   Reproductive/Obstetrics                            Anesthesia Physical  Anesthesia Plan  ASA: III and emergent  Anesthesia Plan: General   Post-op Pain Management:    Induction: Intravenous, Rapid sequence and Cricoid pressure planned  PONV Risk Score and Plan: 4 or greater and Ondansetron, Dexamethasone, Diphenhydramine and Treatment may vary due to age or medical condition  Airway Management Planned: Oral ETT  Additional Equipment:   Intra-op Plan:   Post-operative Plan: Extubation in OR  Informed Consent: I have reviewed the patients History and Physical, chart, labs and discussed the procedure including the risks, benefits and alternatives for the proposed anesthesia with the patient or authorized representative who has indicated his/her understanding and acceptance.     Dental advisory given  Plan Discussed with: CRNA, Anesthesiologist and Surgeon  Anesthesia Plan Comments:        Anesthesia Quick Evaluation

## 2018-12-20 NOTE — ED Notes (Signed)
ED TO INPATIENT HANDOFF REPORT  Name/Age/Gender [name] [name] 73 y.o. male  Code Status Code Status History    Date Active Date Inactive Code Status Order ID Comments User Context   03/09/2017 1327 03/10/2017 1318 Full Code [id number]  Sebastian AcheManny, Theodore, MD Inpatient      Home/SNF/Other Home  Chief Complaint rt side pain  Level of Care/Admitting Diagnosis ED Disposition    None      Medical History Past Medical History:  Diagnosis Date  . BPH associated with nocturia   . CAD (coronary artery disease), native coronary artery    CARDIOLOGIST-  DR Gloris ManchesterRACI TURNER--  normal myoview 03/ 2017 but Cardiac CT 04/ 2017 calcium score 1951, >70% LAD stenosis possible disease RCA , LCX--- cath done showed severe 3V disease S/P  CABG 04/ 2017  . Chronic back pain    herniated disc  . CKD (chronic kidney disease), stage III (HCC)   . Complication of anesthesia    POST-OP URINARY RETENTION 08/ 2013  . Diverticulosis   . Gout    as of 03-20-2017 per pt stable  . History of kidney stones    METABOLIC STONE DISEASE  . History of urinary retention    POST OP SURGERY 08/ 2013  . Hypercalciuria   . Hyperlipidemia    takes Crestor daily  . Hypertension   . LBBB (left bundle branch block)   . Muscle cramps    legs occasonally in am  . Renal calculus, right   . Renal cyst, right   . 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 Seasonal allergies     Allergies Allergies  Allergen Reactions  . Atorvastatin Other (See Comments)    Reaction: dizziness/ dyspepsia. Muscle cramps all over body.  . Hctz [Hydrochlorothiazide] Other (See Comments)    Reaction: arthralgias/ skin burning  . Rosuvastatin Other (See Comments)    Reaction: muscle cramping    IV Location/Drains/Wounds Patient Lines/Drains/Airways Status   Active Line/Drains/Airways    Name:   Placement date:   Placement time:   Site:   Days:   Peripheral IV 12/20/18 Left;Lateral  Forearm   12/20/18    0943    Forearm   less than 1          Labs/Imaging Results for orders placed or performed during the hospital encounter of 12/20/18 (from the past 48 hour(s))  CBC     Status: Abnormal   Collection Time: 12/20/18  9:45 AM  Result Value Ref Range   WBC 12.6 (H) 4.0 - 10.5 K/uL   RBC 4.70 4.22 - 5.81 MIL/uL   Hemoglobin 13.6 13.0 - 17.0 g/dL   HCT 78.242.6 95.639.0 - 21.352.0 %   MCV 90.6 80.0 - 100.0 fL   MCH 28.9 26.0 - 34.0 pg   MCHC 31.9 30.0 - 36.0 g/dL   RDW 08.614.9 57.811.5 - 46.915.5 %   Platelets 138 (L) 150 - 400 K/uL   nRBC 0.0 0.0 - 0.2 %    Comment: Performed at [facility], [address] [place], [place] [address]  Comprehensive metabolic panel     Status: Abnormal   Collection Time: 12/20/18  9:45 AM  Result Value Ref Range   Sodium 137 135 - 145 mmol/L   Potassium 3.8 3.5 - 5.1 mmol/L   Chloride 108 98 - 111 mmol/L   CO2 23 22 - 32 mmol/L   Glucose, Bld 114 (  H) 70 - 99 mg/dL   BUN 23 8 - 23 mg/dL   Creatinine, Ser 4.091.49 (H) 0.61 - 1.24 mg/dL   Calcium 9.2 8.9 - 81.110.3 mg/dL   Total Protein 7.9 6.5 - 8.1 g/dL   Albumin 4.2 3.5 - 5.0 g/dL   AST 21 15 - 41 U/L   ALT 19 0 - 44 U/L   Alkaline Phosphatase 82 38 - 126 U/L   Total Bilirubin 0.8 0.3 - 1.2 mg/dL   GFR calc non Af Amer 46 (L) >60 mL/min   GFR calc Af Amer 54 (L) >60 mL/min   Anion gap 6 5 - 15    Comment: Performed at [facility], [address] [place], [place] [address]  Lipase, blood     Status: None   Collection Time: 12/20/18  9:45 AM  Result Value Ref Range   Lipase 41 11 - 51 U/L    Comment: Performed at [facility], [address] [place], [place] [address]  Lactic acid, plasma     Status: None   Collection Time: 12/20/18  9:45 AM  Result Value Ref Range   Lactic Acid, Venous 1.7 0.5 - 1.9 mmol/L    Comment: Performed at [facility], [address] [place], [place] [address]   Ct Abdomen Pelvis W  Contrast  Result Date: 12/20/2018 CLINICAL DATA:  Abdominal pain, primarily right lower quadrant. EXAM: CT ABDOMEN AND PELVIS WITH CONTRAST TECHNIQUE: Multidetector CT imaging of the abdomen and pelvis was performed using the standard protocol following bolus administration of intravenous contrast. CONTRAST:  100mL OMNIPAQUE IOHEXOL 300 MG/ML  SOLN COMPARISON:  None. FINDINGS: Lower chest: There is mild bibasilar atelectatic change. There is no evident edema or consolidation. There are foci of coronary artery calcification. Hepatobiliary: No focal liver lesions are appreciable. There is cholelithiasis. The gallbladder wall does not appear thickened. There is no biliary duct dilatation. Pancreas: There is no pancreatic mass or inflammatory focus. Spleen: No splenic lesions are evident. Adrenals/Urinary Tract: Adrenals bilaterally appear normal. There is a cyst arising from a the upper pole of the right kidney measuring 3.7 x 3.7 cm. There is a 5 mm cyst in the mid right kidney. There is no evident hydronephrosis on either side. On the right, there are scattered 1-2 mm calculi throughout the kidney. There are several 1-2 mm calculi in the left kidney is well. There is no evident ureteral calculus on either side. Urinary bladder is midline with wall thickness within normal limits. Stomach/Bowel: There are scattered sigmoid diverticula without diverticulitis. There is no evident bowel wall or mesenteric thickening. The terminal ileum appears normal. There is no demonstrable free air or portal venous air. Vascular/Lymphatic: There is aortic and iliac artery atherosclerosis. No adenopathy evident. There is no adenopathy in the abdomen or pelvis. Reproductive: There is a small calculus in the prostate. Prostate and seminal vesicles are normal in size and contour. There is no evident pelvic mass. Other: The appendix is distended with measured diameter of 1.6 cm. There is a small appendicolith proximally. There is  appendiceal wall thickening and loss of clear definition. There is surrounding periappendiceal soft tissue stranding and thickening. No frank abscess or perforation in this area of appendicitis. No abscess or ascites in the abdomen or pelvis. Musculoskeletal: Postoperative changes are noted at L4 and L5. Evidence of median sternotomy. No blastic or lytic bone lesions are evident. There is no intramuscular or evident abdominal wall lesion. IMPRESSION: 1.  Acute appendicitis. Appendix: Location: Appendix arises inferiorly and medially from the cecum. The appendix is located in the lateral right abdomen slightly superior to the superior aspect of the right iliac crest. Diameter: 16 mm Appendicolith: 7 mm appendicolith proximally. Mucosal hyper-enhancement: Appendiceal wall is irregular in contour without appreciable enhancement. Extraluminal gas: None Periappendiceal collection: Slight amount of fluid and more extensive mesenteric stranding along the course of the appendix. No abscess. 2. No evident bowel obstruction. There are scattered sigmoid diverticula without diverticulitis. No evident abscess in the abdomen or pelvis. 3. Multiple small nonobstructing calculi in each kidney. No hydronephrosis or ureteral calculus on either side. There is a prostatic calculus. Urinary bladder wall thickness normal. 4. Cholelithiasis. The gallbladder wall does not appear thickened by CT. 5. Aortoiliac atherosclerosis. There are foci of coronary artery calcification. 6.  Postoperative change in the lumbar spine at L4 and L5. Critical Value/emergent results were called by telephone at the time of interpretation on 12/20/2018 at 11:17 am to Dr. Gerlene Fee , who verbally acknowledged these results. Electronically Signed   By: Lowella Grip III M.D.   On: 12/20/2018 11:17    Pending Labs Unresulted Labs (From admission, onward)    Start     Ordered   12/20/18 1153  SARS Coronavirus 2 (CEPHEID - Performed in [place]  hospital lab), Hosp Order  (Asymptomatic Patients Labs)  Once,   R    Question:  Rule Out  Answer:  Yes   12/20/18 1152          Vitals/Pain Today's Vitals   12/20/18 1137 12/20/18 1139 12/20/18 1145 12/20/18 1215  BP: 139/88  (!) 142/83 (!) 150/83  Pulse: 91  90 93  Resp: 16  (!) 21 19  Temp:      TempSrc:      SpO2: 96%  96% 97%  Weight:      Height:      PainSc:  8       Isolation Precautions No active isolations  Medications Medications  sodium chloride (PF) 0.9 % injection (0 mLs  Hold 12/20/18 1138)  metroNIDAZOLE (FLAGYL) IVPB 500 mg (500 mg Intravenous New Bag/Given 12/20/18 1216)  sodium chloride 0.9 % bolus 500 mL (0 mLs Intravenous Stopped 12/20/18 1046)  oxyCODONE (Oxy IR/ROXICODONE) immediate release tablet 5 mg (5 mg Oral Given 12/20/18 0954)  iohexol (OMNIPAQUE) 300 MG/ML solution 100 mL (100 mLs Intravenous Contrast Given 12/20/18 1044)  cefTRIAXone (ROCEPHIN) 2 g in sodium chloride 0.9 % 100 mL IVPB (0 g Intravenous Stopped 12/20/18 1214)  sodium chloride 0.9 % bolus 1,000 mL (0 mLs Intravenous Stopped 12/20/18 1230)  HYDROmorphone (DILAUDID) injection 0.5 mg (0.5 mg Intravenous Given 12/20/18 1213)    Mobility walks

## 2018-12-20 NOTE — ED Notes (Signed)
Note: All pt. Belongings placed into TCU locker #26. Pt. Remains comfortable.

## 2018-12-20 NOTE — ED Triage Notes (Signed)
Pt c/o a stomachache all night. Pt took tums without relief. Pt states he is now having severe RLQ pain. Denies V/D with some minor nausea.[name]

## 2018-12-20 NOTE — Op Note (Signed)
12/20/2018  3:36 PM  PATIENT:  [name] [name]  73 y.o. male  PRE-OPERATIVE DIAGNOSIS:  acute appendicitis  POST-OPERATIVE DIAGNOSIS: Acute appendicitis  PROCEDURE:  Procedure(s): APPENDECTOMY LAPAROSCOPIC (N/A)  SURGEON:  Surgeon(s) and Role:    Ralene Ok, MD - Primary  ANESTHESIA:   local and general  EBL:  5cc   BLOOD ADMINISTERED:none  DRAINS: none   LOCAL MEDICATIONS USED:  BUPIVICAINE   SPECIMEN:  Source of Specimen: Appendix  DISPOSITION OF SPECIMEN:  PATHOLOGY  COUNTS:  YES  TOURNIQUET:  * No tourniquets in log *  DICTATION: .Dragon Dictation Complications: none  Counts: reported as correct x 2  Findings:  The patient had a acutely inflamed nonperforated appendix  Specimen: Appendix  Indications for procedure:  The patient is a 73 year old male with a history of periumbilical pain localized in the right lower quadrant patient had a CT scan which revealed signs consistent with acute appendicitis the patient back in for laparoscopic appendectomy.  Details of the procedure:The patient was taken back to the operating room. The patient was placed in supine position with bilateral SCDs in place.  A foley catheter was place. The patient was prepped and draped in the usual sterile fashion.  After appropriate anitbiotics were confirmed, a time-out was confirmed and all facts were verified.    A pneumoperitoneum of 14 mmHg was obtained via a Veress needle technique in the left lower quadrant quadrant.  A 5 mm trocar and 5 mm camera then placed intra-abdominally there is no injury to any intra-abdominal organs a 10 mm infraumbilical port was placed and direct visualization as was a 5 mm port in the suprapubic area.   The appendix was identified and seen to be non-perforated.  The appendix was cleaned down to the appendiceal base. The mesoappendix was then incised and the appendiceal artery was cauterized.  The the appendiceal base was clean.  At this time cold  Hemoclip was placed proximallyx2 and one distally and the appendix was transected between these 2. A retrieval bag was then placed into the abdomen and the specimen placed in the bag. The appendiceal stump was cauterized. We evacuate the fluid from the pelvis until the effluent was clear.  The appendix and retrieval  bag was then retrieved via the supraumbilical port. #1 Vicryl was used to reapproximate the fascia at the umbilical port site x2. The skin was reapproximated all port sites 3-0 Monocryl subcuticular fashion. The skin was dressed with Dermabond.  The patient had the foley removed. The patient was awakened from general anesthesia was taken to recovery room in stable condition.     \ PLAN OF CARE: Admit for overnight observation  PATIENT DISPOSITION:  PACU - hemodynamically stable.   Delay start of Pharmacological VTE agent (>24hrs) due to surgical blood loss or risk of bleeding: not applicable

## 2018-12-20 NOTE — ED Provider Notes (Signed)
[facility] Emergency Department Provider Note MRN:  [id number]  Arrival date & time: 12/20/18     Chief Complaint   Abdominal Pain   History of Present Illness   [name] [name] is a 73 y.o. year-old male with a history of CAD status post CABG, CKD presenting to the ED with chief complaint of abdominal pain.  Abdominal pain began gradually last night, was initially diffuse, associated with mild nausea.  Took Tums without relief.  Pain has since migrated and is now localized to the right lower quadrant, worse with palpation.  Denies vomiting, no fever, no diarrhea, no constipation, no upper abdominal pain, no chest pain or shortness of breath.  No other exacerbating or alleviating factors.  Pain is constant.  Review of Systems  A complete 10 system review of systems was obtained and all systems are negative except as noted in the HPI and PMH.   Patient's Health History    Past Medical History:  Diagnosis Date  . BPH associated with nocturia   . CAD (coronary artery disease), native coronary artery    CARDIOLOGIST-  DR Tressia Miners TURNER--  normal myoview 03/ 2017 but Cardiac CT 04/ 2017 calcium score 1951, >70% LAD stenosis possible disease RCA , LCX--- cath done showed severe 3V disease S/P  CABG 04/ 2017  . Chronic back pain    herniated disc  . CKD (chronic kidney disease), stage III ([place])   . Complication of anesthesia    POST-OP URINARY RETENTION 08/ 2013  . Diverticulosis   . Gout    as of 03-20-2017 per pt stable  . History of kidney stones    METABOLIC STONE DISEASE  . History of urinary retention    POST OP SURGERY 08/ 2013  . Hypercalciuria   . Hyperlipidemia    takes Crestor daily  . Hypertension   . LBBB (left bundle branch block)   . Muscle cramps    legs occasonally in am  . Renal calculus, right   . Renal cyst, right   . 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 Seasonal allergies     Past  Surgical History:  Procedure Laterality Date  . CARDIAC CATHETERIZATION N/A 10/28/2015   Procedure: Left Heart Cath and Coronary Angiography;  Surgeon: Belva Crome, MD;  Location: [facility];  Service: Cardiovascular;  Laterality: N/A;  . CARDIAC CATHETERIZATION  05-21-1995  dr Daneen Schick   non-obstructive cad;  normal lvf  . CARDIOVASCULAR STRESS TEST  09/23/2015  dr Tressia Miners turner   Normal nuclear study w/ no prior infarct and no ischemia/  normal LV function and wall motion , nuclear stress ef 54%  . COLONOSCOPY  last one 2009  . CORONARY ARTERY BYPASS GRAFT N/A 11/04/2015   Procedure: CORONARY ARTERY BYPASS GRAFTING (CABG) times three using left internal mammary artery and right saphenous vein harvested with endoscope.;  Surgeon: Rexene Alberts, MD;  Location: [place];  Service: Open Heart Surgery;  Laterality: N/A;  . CYSTOSCOPY WITH RETROGRADE PYELOGRAM, URETEROSCOPY AND STENT PLACEMENT Left 03/09/2017   Procedure: CYSTOSCOPY WITH RETROGRADE PYELOGRAM, URETEROSCOPY AND STENT PLACEMENT;  Surgeon: Alexis Frock, MD;  Location: [facility];  Service: Urology;  Laterality: Left;  . CYSTOSCOPY WITH URETEROSCOPY  07/28/2012   Procedure: CYSTOSCOPY WITH URETEROSCOPY;  Surgeon: Dutch Gray, MD;  Location: [facility];  Service: Urology;  Laterality: Left;     . CYSTOSCOPY/URETEROSCOPY/HOLMIUM LASER/STENT PLACEMENT  Right 03/25/2017   Procedure: CYSTOSCOPY RIGHT URETEROSCOPYVHOLMIUM LASER STENT PLACEMENT;  Surgeon: Crista ElliotBell, Eugene D III, MD;  [facility];  Service: Urology;  Laterality: Right;  . ESOPHAGOGASTRODUODENOSCOPY (EGD) WITH ESOPHAGEAL DILATION  05/ 2018 approx.  [name]. EXTRACORPOREAL SHOCK WAVE LITHOTRIPSY  multiple last one 05-29-2012  . FOOT SURGERY  1990s   right-removed bone spur   . HOLMIUM LASER APPLICATION  07/28/2012   Procedure: HOLMIUM LASER APPLICATION;  Surgeon: Crecencio McLes Borden, MD;  Location: [facility];  Service: Urology;  Laterality: Left;  left lithotripsy  . HOLMIUM  LASER APPLICATION Left 03/09/2017   Procedure: HOLMIUM LASER APPLICATION;  Surgeon: Sebastian AcheManny, Theodore, MD;  Location: [facility];  Service: Urology;  Laterality: Left;  . POSTERIOR LUMBAR FUSION  03/07/2012   w/ laminecotmy L4-5  . PYELOLITHOTOMY Right 01/1982  . TEE WITHOUT CARDIOVERSION N/A 11/04/2015   Procedure: TRANSESOPHAGEAL ECHOCARDIOGRAM (TEE);  Surgeon: Purcell Nailslarence H Owen, MD;  [facility];  Service: Open Heart Surgery;  Laterality: N/A;  . TONSILLECTOMY  age 73  . URETEROLITHOTOMY Right 03/1994    Family History  Problem Relation Age of Onset  . Dementia Mother   . Prostate cancer Father     Social History   Socioeconomic History  . Marital status: Married    Spouse name: Not on file  . Number of children: Not on file  . Years of education: Not on file  . Highest education level: Not on file  Occupational History  . Not on file  Social Needs  . Financial resource strain: Not on file  . Food insecurity:    Worry: Not on file    Inability: Not on file  . Transportation needs:    Medical: Not on file    Non-medical: Not on file  Tobacco Use  . Smoking status: Never Smoker  . Smokeless tobacco: Never Used  Substance and Sexual Activity  . Alcohol use: No    Alcohol/week: 0.0 standard drinks  . Drug use: No  . Sexual activity: Yes    Comment: vasectomy 1980s  Lifestyle  . Physical activity:    Days per week: Not on file    Minutes per session: Not on file  . Stress: Not on file  Relationships  . Social connections:    Talks on phone: Not on file    Gets together: Not on file    Attends religious service: Not on file    Active member of club or organization: Not on file    Attends meetings of clubs or organizations: Not on file    Relationship status: Not on file  . Intimate partner violence:    Fear of current or ex partner: Not on file    Emotionally abused: Not on file    Physically abused: Not on file    Forced sexual activity: Not on file  Other Topics  Concern  . Not on file  Social History Narrative  . Not on file     Physical Exam  Vital Signs and Nursing Notes reviewed Vitals:   12/20/18 1145 12/20/18 1215  BP: (!) 142/83 (!) 150/83  Pulse: 90 93  Resp: (!) 21 19  Temp:    SpO2: 96% 97%    CONSTITUTIONAL: Well-appearing, NAD NEURO:  Alert and oriented x 3, no focal deficits EYES:  eyes equal and reactive ENT/NECK:  no LAD, no JVD CARDIO: Regular rate, well-perfused, normal S1 and S2 PULM:  CTAB no wheezing or rhonchi GI/GU:  normal bowel sounds,  non-distended, moderate tenderness palpation to the right lower quadrant MSK/SPINE:  No gross deformities, no edema SKIN:  no rash, atraumatic PSYCH:  Appropriate speech and behavior  Diagnostic and Interventional Summary    EKG Interpretation  Date/Time:  Saturday December 20 2018 09:33:38 EDT Ventricular Rate:  85 PR Interval:    QRS Duration: 141 QT Interval:  406 QTC Calculation: 483 R Axis:   110 Text Interpretation:  Sinus rhythm Nonspecific intraventricular conduction delay Probable anteroseptal infarct, recent Confirmed by Kennis CarinaBero, Michael [id number]) on 12/20/2018 9:36:11 AM      Labs Reviewed  CBC - Abnormal; Notable for the following components:      Result Value   WBC 12.6 (*)    Platelets 138 (*)    All other components within normal limits  COMPREHENSIVE METABOLIC PANEL - Abnormal; Notable for the following components:   Glucose, Bld 114 (*)    Creatinine, Ser 1.49 (*)    GFR calc non Af Amer 46 (*)    GFR calc Af Amer 54 (*)    All other components within normal limits  SARS CORONAVIRUS 2 (HOSPITAL ORDER, PERFORMED IN [facility])  LIPASE, BLOOD  LACTIC ACID, PLASMA    CT ABDOMEN PELVIS W CONTRAST  Final Result      Medications  sodium chloride (PF) 0.9 % injection (0 mLs  Hold 12/20/18 1138)  metroNIDAZOLE (FLAGYL) IVPB 500 mg (500 mg Intravenous New Bag/Given 12/20/18 1216)  sodium chloride 0.9 % bolus 500 mL (0 mLs Intravenous Stopped 12/20/18  1046)  oxyCODONE (Oxy IR/ROXICODONE) immediate release tablet 5 mg (5 mg Oral Given 12/20/18 0954)  iohexol (OMNIPAQUE) 300 MG/ML solution 100 mL (100 mLs Intravenous Contrast Given 12/20/18 1044)  cefTRIAXone (ROCEPHIN) 2 g in sodium chloride 0.9 % 100 mL IVPB (0 g Intravenous Stopped 12/20/18 1214)  sodium chloride 0.9 % bolus 1,000 mL (0 mLs Intravenous Stopped 12/20/18 1230)  HYDROmorphone (DILAUDID) injection 0.5 mg (0.5 mg Intravenous Given 12/20/18 1213)     Procedures Critical Care Critical Care Documentation Critical care time provided by me (excluding procedures): 33 minutes  Condition necessitating critical care: Acute appendicitis  Components of critical care management: reviewing of prior records, laboratory and imaging interpretation, frequent re-examination and reassessment of vital signs, administration of IV fluids, IV antibiotics, discussion with consulting services    ED Course and Medical Decision Making  I have reviewed the triage vital signs and the nursing notes.  Pertinent labs & imaging results that were available during my care of the patient were reviewed by me and considered in my medical decision making (see below for details).  CT to exclude appendicitis, work-up pending.  Clinical Course as of Dec 19 1233  Sat Dec 20, 2018  [id number] Differential diagnosis also includes right-sided diverticulitis, musculoskeletal pain related to the abdominal wall, less likely shingles given the absence of rash, less likely aortic pathology given the more localized pain.   [MB]    Clinical Course User Index [MB] Sabas SousBero, Michael M, MD     CT confirms acute appendicitis, discussed case with Dr. Derrell Lollingamirez of general surgery, who will come to evaluate the patient.  Elmer SowMichael M. Pilar PlateBero, MD [facility] [email or web address]  Final Clinical Impressions(s) / ED Diagnoses     ICD-10-CM   1. Abdominal pain, unspecified abdominal location R10.9    2. Appendicitis with peritonitis K35.33     ED Discharge Orders    None  Sabas SousBero, Michael M, MD 12/20/18 1236

## 2018-12-20 NOTE — H&P (Signed)
[name] [name] is an 73 y.o. male.   Chief Complaint: abdominal pain HPI: Pt is a 9M with significatn cardiac hx , CKD, with abdominal pain starting last night.  Pt states he took some tums to see if that would help and it hadn't.  Pt with con't abdominal pain that radiated to the RLQ area.  Pt was seen in ED d/t pain.  On ED eval he had CT and labs.  CT showed signs of acute appendicitis.  I personally reviewed this CT.  Elev WBC   Pt denies any f/n/v/d.  Gen Surgery was consulted for eval and tx.  Past Medical History:  Diagnosis Date  . BPH associated with nocturia   . CAD (coronary artery disease), native coronary artery    CARDIOLOGIST-  DR Gloris ManchesterRACI TURNER--  normal myoview 03/ 2017 but Cardiac CT 04/ 2017 calcium score 1951, >70% LAD stenosis possible disease RCA , LCX--- cath done showed severe 3V disease S/P  CABG 04/ 2017  . Chronic back pain    herniated disc  . CKD (chronic kidney disease), stage III (HCC)   . Complication of anesthesia    POST-OP URINARY RETENTION 08/ 2013  . Diverticulosis   . Gout    as of 03-20-2017 per pt stable  . History of kidney stones    METABOLIC STONE DISEASE  . History of urinary retention    POST OP SURGERY 08/ 2013  . Hypercalciuria   . Hyperlipidemia    takes Crestor daily  . Hypertension   . LBBB (left bundle branch block)   . Muscle cramps    legs occasonally in am  . Renal calculus, right   . Renal cyst, right   . 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 Seasonal allergies     Past Surgical History:  Procedure Laterality Date  . CARDIAC CATHETERIZATION N/A 10/28/2015   Procedure: Left Heart Cath and Coronary Angiography;  Surgeon: Lyn RecordsHenry W Smith, MD;  [facility];  Service: Cardiovascular;  Laterality: N/A;  . CARDIAC CATHETERIZATION  05-21-1995  dr Verdis Primehenry smith   non-obstructive cad;  normal lvf  . CARDIOVASCULAR STRESS TEST  09/23/2015  dr Gloris Manchestertraci turner   Normal  nuclear study w/ no prior infarct and no ischemia/  normal LV function and wall motion , nuclear stress ef 54%  . COLONOSCOPY  last one 2009  . CORONARY ARTERY BYPASS GRAFT N/A 11/04/2015   Procedure: CORONARY ARTERY BYPASS GRAFTING (CABG) times three using left internal mammary artery and right saphenous vein harvested with endoscope.;  Surgeon: Purcell Nailslarence H Owen, MD;  Location: [facility];  Service: Open Heart Surgery;  Laterality: N/A;  . CYSTOSCOPY WITH RETROGRADE PYELOGRAM, URETEROSCOPY AND STENT PLACEMENT Left 03/09/2017   Procedure: CYSTOSCOPY WITH RETROGRADE PYELOGRAM, URETEROSCOPY AND STENT PLACEMENT;  Surgeon: Sebastian AcheManny, Theodore, MD;  Location: [facility];  Service: Urology;  Laterality: Left;  . CYSTOSCOPY WITH URETEROSCOPY  07/28/2012   Procedure: CYSTOSCOPY WITH URETEROSCOPY;  Surgeon: Crecencio McLes Borden, MD;  Location: [facility];  Service: Urology;  Laterality: Left;     . CYSTOSCOPY/URETEROSCOPY/HOLMIUM LASER/STENT PLACEMENT Right 03/25/2017   Procedure: CYSTOSCOPY RIGHT URETEROSCOPYVHOLMIUM LASER STENT PLACEMENT;  Surgeon: Crista ElliotBell, Eugene D III, MD;  [facility];  Service: Urology;  Laterality: Right;  . ESOPHAGOGASTRODUODENOSCOPY (EGD) WITH ESOPHAGEAL DILATION  05/ 2018 approx.  [name]. EXTRACORPOREAL SHOCK WAVE LITHOTRIPSY  multiple last one 05-29-2012  . FOOT SURGERY  1990s   right-removed bone spur   . HOLMIUM LASER APPLICATION  7/82/4235   Procedure: HOLMIUM LASER APPLICATION;  Surgeon: Dutch Gray, MD;  Location: [facility];  Service: Urology;  Laterality: Left;  left lithotripsy  . HOLMIUM LASER APPLICATION Left 3/61/4431   Procedure: HOLMIUM LASER APPLICATION;  Surgeon: Alexis Frock, MD;  Location: [facility];  Service: Urology;  Laterality: Left;  . POSTERIOR LUMBAR FUSION  03/07/2012   w/ laminecotmy L4-5  . PYELOLITHOTOMY Right 01/1982  . TEE WITHOUT CARDIOVERSION N/A 11/04/2015   Procedure: TRANSESOPHAGEAL ECHOCARDIOGRAM (TEE);  Surgeon: Rexene Alberts, MD;  Location: [place];  Service:  Open Heart Surgery;  Laterality: N/A;  . TONSILLECTOMY  age 60  . URETEROLITHOTOMY Right 03/1994    Family History  Problem Relation Age of Onset  . Dementia Mother   . Prostate cancer Father    Social History:  reports that he has never smoked. He has never used smokeless tobacco. He reports that he does not drink alcohol or use drugs.  Allergies:  Allergies  Allergen Reactions  . Atorvastatin Other (See Comments)    Reaction: dizziness/ dyspepsia. Muscle cramps all over body.  . Hctz [Hydrochlorothiazide] Other (See Comments)    Reaction: arthralgias/ skin burning  . Rosuvastatin Other (See Comments)    Reaction: muscle cramping    (Not in a hospital admission)   Results for orders placed or performed during the hospital encounter of 12/20/18 (from the past 48 hour(s))  CBC     Status: Abnormal   Collection Time: 12/20/18  9:45 AM  Result Value Ref Range   WBC 12.6 (H) 4.0 - 10.5 K/uL   RBC 4.70 4.22 - 5.81 MIL/uL   Hemoglobin 13.6 13.0 - 17.0 g/dL   HCT 42.6 39.0 - 52.0 %   MCV 90.6 80.0 - 100.0 fL   MCH 28.9 26.0 - 34.0 pg   MCHC 31.9 30.0 - 36.0 g/dL   RDW 14.9 11.5 - 15.5 %   Platelets 138 (L) 150 - 400 K/uL   nRBC 0.0 0.0 - 0.2 %    Comment: Performed at [facility], [place] [address], [place], [place] [address]  Comprehensive metabolic panel     Status: Abnormal   Collection Time: 12/20/18  9:45 AM  Result Value Ref Range   Sodium 137 135 - 145 mmol/L   Potassium 3.8 3.5 - 5.1 mmol/L   Chloride 108 98 - 111 mmol/L   CO2 23 22 - 32 mmol/L   Glucose, Bld 114 (H) 70 - 99 mg/dL   BUN 23 8 - 23 mg/dL   Creatinine, Ser 1.49 (H) 0.61 - 1.24 mg/dL   Calcium 9.2 8.9 - 10.3 mg/dL   Total Protein 7.9 6.5 - 8.1 g/dL   Albumin 4.2 3.5 - 5.0 g/dL   AST 21 15 - 41 U/L   ALT 19 0 - 44 U/L   Alkaline Phosphatase 82 38 - 126 U/L   Total Bilirubin 0.8 0.3 - 1.2 mg/dL   GFR calc non Af Amer 46 (L) >60 mL/min   GFR calc Af Amer 54 (L) >60 mL/min   Anion  gap 6 5 - 15    Comment: Performed at [facility], [place] [address], [place], [place] [address]  Lipase, blood     Status: None   Collection Time: 12/20/18  9:45 AM  Result Value Ref Range   Lipase 41 11 - 51 U/L    Comment: Performed at [facility], [address] [place], [place] [address]  Lactic acid, plasma     Status: None   Collection Time: 12/20/18  9:45 AM  Result Value Ref Range   Lactic Acid, Venous 1.7 0.5 - 1.9 mmol/L    Comment: Performed at [facility], [address] [place], [place] [address]  SARS Coronavirus 2 (CEPHEID - Performed in [facility] hospital lab), Hosp Order     Status: None   Collection Time: 12/20/18 12:17 PM  Result Value Ref Range   SARS Coronavirus 2 NEGATIVE NEGATIVE    Comment: (NOTE) If result is NEGATIVE SARS-CoV-2 target nucleic acids are NOT DETECTED. The SARS-CoV-2 RNA is generally detectable in upper and lower  respiratory specimens during the acute phase of infection. The lowest  concentration of SARS-CoV-2 viral copies this assay can detect is 250  copies / mL. A negative result does not preclude SARS-CoV-2 infection  and should not be used as the sole basis for treatment or other  patient management decisions.  A negative result may occur with  improper specimen collection / handling, submission of specimen other  than nasopharyngeal swab, presence of viral mutation(s) within the  areas targeted by this assay, and inadequate number of viral copies  (<250 copies / mL). A negative result must be combined with clinical  observations, patient history, and epidemiological information. If result is POSITIVE SARS-CoV-2 target nucleic acids are DETECTED. The SARS-CoV-2 RNA is generally detectable in upper and lower  respiratory specimens dur ing the acute phase of infection.  Positive  results are indicative of active infection with SARS-CoV-2.  Clinical  correlation with patient  history and other diagnostic information is  necessary to determine patient infection status.  Positive results do  not rule out bacterial infection or co-infection with other viruses. If result is PRESUMPTIVE POSTIVE SARS-CoV-2 nucleic acids MAY BE PRESENT.   A presumptive positive result was obtained on the submitted specimen  and confirmed on repeat testing.  While 2019 novel coronavirus  (SARS-CoV-2) nucleic acids may be present in the submitted sample  additional confirmatory testing may be necessary for epidemiological  and / or clinical management purposes  to differentiate between  SARS-CoV-2 and other Sarbecovirus currently known to infect humans.  If clinically indicated additional testing with an alternate test  methodology [phone]([id number]) is advised. The SARS-CoV-2 RNA is generally  detectable in upper and lower respiratory sp ecimens during the acute  phase of infection. The expected result is Negative. Fact Sheet for Patients:  BoilerBrush.com.cyhttps://www.fda.gov/media/136312/download Fact Sheet for Healthcare Providers: https://pope.com/https://www.fda.gov/media/136313/download This test is not yet approved or cleared by the [place] FDA and has been authorized for detection and/or diagnosis of SARS-CoV-2 by FDA under an Emergency Use Authorization (EUA).  This EUA will remain in effect (meaning this test can be used) for the duration of the COVID-19 declaration under Section 564(b)(1) of the Act, 21 U.S.C. section 360bbb-3(b)(1), unless the authorization is terminated or revoked sooner. Performed at [facility], [address] [place], [place] [address]    Ct Abdomen Pelvis W Contrast  Result Date: 12/20/2018 CLINICAL DATA:  Abdominal pain, primarily right lower quadrant. EXAM: CT ABDOMEN AND PELVIS WITH CONTRAST TECHNIQUE: Multidetector CT imaging of the abdomen and pelvis was performed using the standard protocol following bolus administration of intravenous contrast. CONTRAST:   100mL OMNIPAQUE IOHEXOL 300 MG/ML  SOLN COMPARISON:  None. FINDINGS: Lower chest: There is mild bibasilar atelectatic change. There is no evident edema or consolidation.  There are foci of coronary artery calcification. Hepatobiliary: No focal liver lesions are appreciable. There is cholelithiasis. The gallbladder wall does not appear thickened. There is no biliary duct dilatation. Pancreas: There is no pancreatic mass or inflammatory focus. Spleen: No splenic lesions are evident. Adrenals/Urinary Tract: Adrenals bilaterally appear normal. There is a cyst arising from a the upper pole of the right kidney measuring 3.7 x 3.7 cm. There is a 5 mm cyst in the mid right kidney. There is no evident hydronephrosis on either side. On the right, there are scattered 1-2 mm calculi throughout the kidney. There are several 1-2 mm calculi in the left kidney is well. There is no evident ureteral calculus on either side. Urinary bladder is midline with wall thickness within normal limits. Stomach/Bowel: There are scattered sigmoid diverticula without diverticulitis. There is no evident bowel wall or mesenteric thickening. The terminal ileum appears normal. There is no demonstrable free air or portal venous air. Vascular/Lymphatic: There is aortic and iliac artery atherosclerosis. No adenopathy evident. There is no adenopathy in the abdomen or pelvis. Reproductive: There is a small calculus in the prostate. Prostate and seminal vesicles are normal in size and contour. There is no evident pelvic mass. Other: The appendix is distended with measured diameter of 1.6 cm. There is a small appendicolith proximally. There is appendiceal wall thickening and loss of clear definition. There is surrounding periappendiceal soft tissue stranding and thickening. No frank abscess or perforation in this area of appendicitis. No abscess or ascites in the abdomen or pelvis. Musculoskeletal: Postoperative changes are noted at L4 and L5. Evidence of  median sternotomy. No blastic or lytic bone lesions are evident. There is no intramuscular or evident abdominal wall lesion. IMPRESSION: 1.  Acute appendicitis. Appendix: Location: Appendix arises inferiorly and medially from the cecum. The appendix is located in the lateral right abdomen slightly superior to the superior aspect of the right iliac crest. Diameter: 16 mm Appendicolith: 7 mm appendicolith proximally. Mucosal hyper-enhancement: Appendiceal wall is irregular in contour without appreciable enhancement. Extraluminal gas: None Periappendiceal collection: Slight amount of fluid and more extensive mesenteric stranding along the course of the appendix. No abscess. 2. No evident bowel obstruction. There are scattered sigmoid diverticula without diverticulitis. No evident abscess in the abdomen or pelvis. 3. Multiple small nonobstructing calculi in each kidney. No hydronephrosis or ureteral calculus on either side. There is a prostatic calculus. Urinary bladder wall thickness normal. 4. Cholelithiasis. The gallbladder wall does not appear thickened by CT. 5. Aortoiliac atherosclerosis. There are foci of coronary artery calcification. 6.  Postoperative change in the lumbar spine at L4 and L5. Critical Value/emergent results were called by telephone at the time of interpretation on 12/20/2018 at 11:17 am to Dr. Kennis Carina , who verbally acknowledged these results. Electronically Signed   By: Bretta Bang III M.D.   On: 12/20/2018 11:17    ROS  Blood pressure (!) 176/114, pulse 89, temperature 98.5 F (36.9 C), temperature source Oral, resp. rate 18, height  (1.676 m), weight 97.5 kg, SpO2 (!) 89 %. Physical Exam   Assessment/Plan 27 M with acute appendicitis. Past Medical History:  Diagnosis Date  . BPH associated with nocturia   . CAD (coronary artery disease), native coronary artery    CARDIOLOGIST-  DR Gloris Manchester TURNER--  normal myoview 03/ 2017 but Cardiac CT 04/ 2017 calcium score 1951,  >70% LAD stenosis possible disease RCA , LCX--- cath done showed severe 3V disease S/P  CABG 04/ 2017  .  Chronic back pain    herniated disc  . CKD (chronic kidney disease), stage III (HCC)   . Complication of anesthesia    POST-OP URINARY RETENTION 08/ 2013  . Diverticulosis   . Gout    as of 03-20-2017 per pt stable  . History of kidney stones    METABOLIC STONE DISEASE  . History of urinary retention    POST OP SURGERY 08/ 2013  . Hypercalciuria   . Hyperlipidemia    takes Crestor daily  . Hypertension   . LBBB (left bundle branch block)   . Muscle cramps    legs occasonally in am  . Renal calculus, right   . Renal cyst, right   . 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 Seasonal allergies    1.  Will plan OR for lap appy 2. I discussed with the patient the risks benefits of the procedure to include but not limited to: Infection, bleeding, damage to surrounding structures, possible ileus, possible postoperative infection. Patient voiced understanding and wishes to proceed.   Axel FillerArmando Abdinasir Spadafore, MD 12/20/2018, 2:31 PM

## 2018-12-20 NOTE — Transfer of Care (Signed)
Immediate Anesthesia Transfer of Care Note  Patient: [name] [name]  Procedure(s) Performed: APPENDECTOMY LAPAROSCOPIC (N/A Abdomen)  Patient Location: PACU  Anesthesia Type:General  Level of Consciousness: drowsy and patient cooperative  Airway & Oxygen Therapy: Patient Spontanous Breathing and Patient connected to face mask oxygen  Post-op Assessment: Report given to RN and Post -op Vital signs reviewed and stable  Post vital signs: Reviewed and stable  Last Vitals:  Vitals Value Taken Time  BP 145/93 12/20/2018  3:50 PM  Temp 37.1 C 12/20/2018  3:50 PM  Pulse 86 12/20/2018  3:52 PM  Resp 17 12/20/2018  3:52 PM  SpO2 100 % 12/20/2018  3:52 PM  Vitals shown include unvalidated device data.  Last Pain:  Vitals:   12/20/18 1300  TempSrc:   PainSc: 0-No pain         Complications: No apparent anesthesia complications

## 2018-12-20 NOTE — Anesthesia Postprocedure Evaluation (Signed)
Anesthesia Post Note  Patient: [name] [name]  Procedure(s) Performed: APPENDECTOMY LAPAROSCOPIC (N/A Abdomen)     Patient location during evaluation: PACU Anesthesia Type: General Level of consciousness: sedated Pain management: pain level controlled Vital Signs Assessment: post-procedure vital signs reviewed and stable Respiratory status: spontaneous breathing and respiratory function stable Cardiovascular status: stable Postop Assessment: no apparent nausea or vomiting Anesthetic complications: no    Last Vitals:  Vitals:   12/20/18 1630 12/20/18 1646  BP: 134/83 (!) 148/81  Pulse:  87  Resp:  16  Temp: 37 C (!) 36.4 C  SpO2:  95%    Last Pain:  Vitals:   12/20/18 1646  TempSrc: Oral  PainSc:                  Shia Delaine DANIEL

## 2018-12-21 ENCOUNTER — Encounter (HOSPITAL_COMMUNITY): Payer: Self-pay | Admitting: General Surgery

## 2018-12-21 DIAGNOSIS — K3580 Unspecified acute appendicitis: Secondary | ICD-10-CM | POA: Diagnosis not present

## 2018-12-21 NOTE — Discharge Summary (Signed)
Physician Discharge Summary  Patient ID: [name] [name] MRN: [id number] DOB/AGE: March 15, 1946 73 y.o.  Admit date: 12/20/2018 Discharge date: 12/21/2018  Admission Diagnoses: acute appendicitis  Discharge Diagnoses:  Active Problems:   Acute appendicitis   Discharged Condition: good  Hospital Course: PT admitted for acute appendicitis.  He underwent lap appy please see op note for full details.  Post op pt did well and started on CLD and adv as tol.  He tol that well.  He had no pain.  He was afebrile deemed stable for DC and DCd home  Consults: None  Significant Diagnostic Studies: none  Treatments: surgery: as above  Discharge Exam: Blood pressure 127/75, pulse 66, temperature 98.4 F (36.9 C), temperature source Oral, resp. rate 16, height 5\' 6"  (1.676 m), weight 98.4 kg, SpO2 95 %. General appearance: alert and cooperative GI: soft, non-tender; bowel sounds normal; no masses,  no organomegaly  Disposition: Discharge disposition: 01-Home or Self Care       Discharge Instructions    Diet - low sodium heart healthy   Complete by:  As directed    Increase activity slowly   Complete by:  As directed      Allergies as of 12/21/2018      Reactions   Atorvastatin Other (See Comments)   Reaction: dizziness/ dyspepsia. Muscle cramps all over body.   Hctz [hydrochlorothiazide] Other (See Comments)   Reaction: arthralgias/ skin burning   Rosuvastatin Other (See Comments)   Reaction: muscle cramping      Medication List    TAKE these medications   allopurinol 100 MG tablet Commonly known as:  ZYLOPRIM Take 100 mg by mouth 2 (two) times daily.   aspirin 81 MG EC tablet Take 1 tablet (81 mg total) by mouth daily.   cholecalciferol 1000 units tablet Commonly known as:  VITAMIN D Take 1,000 Units by mouth every morning.   CoQ10 100 MG Caps Take 1 capsule by mouth every evening.   ezetimibe 10 MG tablet Commonly known as:  ZETIA Take 1 tablet (10 mg total) by mouth  daily. Please keep upcoming appt for future refills.   fexofenadine 180 MG tablet Commonly known as:  ALLEGRA Take 180 mg by mouth daily as needed. (allergies)   metoprolol tartrate 25 MG tablet Commonly known as:  LOPRESSOR TAKE 1 TABLET BY MOUTH TWICE DAILY   multivitamin with minerals Tabs tablet Take 1 tablet by mouth every morning.   naproxen sodium 220 MG tablet Commonly known as:  ALEVE Take 220 mg by mouth daily as needed (pain).   omeprazole 20 MG capsule Commonly known as:  PRILOSEC Take 20 mg by mouth every evening.   oxybutynin 5 MG 24 hr tablet Commonly known as:  DITROPAN-XL Take 5 mg by mouth at bedtime.   pravastatin 40 MG tablet Commonly known as:  PRAVACHOL TK 1 T PO QPM   tamsulosin 0.4 MG Caps capsule Commonly known as:  FLOMAX Take 0.4 mg by mouth daily after supper.   telmisartan 20 MG tablet Commonly known as:  MICARDIS Take 1 tablet (20 mg total) by mouth every morning.      Follow-up [facility] Follow up.   Specialty:  General Surgery Contact information: [address] [place] [phone]          Signed: Ralene Ok 12/21/2018, 9:22 AM

## 2018-12-21 NOTE — Discharge Instructions (Signed)
Appendicitis, Adult  The appendix is a tube in the body that is shaped like a finger. It is attached to the large intestine. Appendicitis means that this tube is swollen (inflamed). If this is not treated, the tube can tear (rupture). This can lead to a life-threatening infection. This condition can also cause pus to build up in the appendix (abscess). What are the causes? This condition may be caused by something that blocks the appendix. These include:  A ball of poop (stool).  Lymph glands that are bigger than normal. Sometimes the cause is not known. What increases the risk? You are more likely to develop this condition if you are between 10 and 30 years of age. What are the signs or symptoms? Symptoms of this condition include:  Pain around the belly button (navel). ? The pain moves toward the lower right belly (abdomen). ? The pain can get worse with time. ? The pain can get worse if you cough. ? The pain can get worse if you move suddenly.  Tenderness in the lower right belly.  Feeling sick to your stomach (nauseous).  Throwing up (vomiting).  Not feeling hungry (loss of appetite).  A fever.  Having trouble pooping (constipation).  Watery poop (diarrhea).  Not feeling well. How is this treated? Most often, this condition is treated by taking out the appendix (appendectomy). There are two ways to do this:  Open surgery. For this method, the appendix is taken out through a large cut (incision). The cut is made in the lower right belly. This surgery may be used if: ? You have scars from another surgery. ? You have a bleeding condition. ? You are pregnant and will be having your baby soon. ? You have a condition that makes it hard to do the other type of surgery.  Laparoscopic surgery. For this method, the appendix is taken out through small cuts. Often, this surgery: ? Causes less pain. ? Causes fewer problems. ? Is easier to heal from. If your appendix tears and  pus forms:  A drain may be put into the sore. The drain will be used to get rid of the pus.  You may get an antibiotic medicine through an IV line.  Your appendix may or may not need to be taken out. Follow these instructions at home: If you had surgery, follow instructions from your doctor on how to care for yourself at home and how to take care of your cut from surgery. Medicines  Take over-the-counter and prescription medicines only as told by your doctor.  If you were prescribed an antibiotic medicine, take it as told by your doctor. Do not stop taking the antibiotic even if you start to feel better. Eating and drinking Follow instructions from your doctor about what you cannot eat or drink. You may go back to your diet slowly if:  You no longer feel sick to your stomach.  You have stopped throwing up. General instructions  Do not use any products that contain nicotine or tobacco, such as cigarettes, e-cigarettes, and chewing tobacco. If you need help quitting, ask your doctor.  Do not drive or use heavy machinery while taking prescription pain medicine.  Ask your doctor if the medicine you are taking can cause trouble pooping. You may need to take steps to prevent or treat trouble pooping: ? Drink enough fluid to keep your pee (urine) pale yellow. ? Take over-the-counter or prescription medicines. ? Eat foods that are high in fiber. These include beans,   whole grains, and fresh fruits and vegetables. ? Limit foods that are high in fat and sugar. These include fried or sweet foods.  Keep all follow-up visits as told by your doctor. This is important. Contact a doctor if:  There is pus, blood, or a lot of fluid coming from your cut or cuts from surgery.  You are sick to your stomach or you throw up. Get help right away if:  You have pain in your belly, and the pain is getting worse.  You have a fever.  You have chills.  You are very tired.  You have muscle  pain.  You are short of breath. Summary  Appendicitis is swelling of the appendix. The appendix is a tube that is shaped like a finger. It is joined to the large intestine.  This condition may be caused by something that blocks the appendix. This can lead to an infection.  This condition is usually treated by taking out the appendix. This information is not intended to replace advice given to you by your health care provider. Make sure you discuss any questions you have with your health care provider. Document Released: 09/24/2011 Document Revised: 12/18/2017 Document Reviewed: 12/18/2017 Elsevier Interactive Patient Education  2019 [employer]  

## 2018-12-21 NOTE — Progress Notes (Signed)
Nurse reviewed discharge instructions with pt.  Pt verbalized understanding of discharge instructions and follow up appointment.  No concerns at time of discharge. ?

## 2019-01-03 ENCOUNTER — Other Ambulatory Visit: Payer: Self-pay | Admitting: Cardiology

## 2019-01-06 ENCOUNTER — Other Ambulatory Visit: Payer: Self-pay | Admitting: Cardiology

## 2019-01-06 MED ORDER — METOPROLOL TARTRATE 25 MG PO TABS: 25.0000 mg | ORAL_TABLET | Freq: Two times a day (BID) | ORAL | 3 refills | Status: DC

## 2019-01-06 MED ORDER — EZETIMIBE 10 MG PO TABS: 10.0000 mg | ORAL_TABLET | Freq: Every day | ORAL | 3 refills | Status: DC

## 2019-01-23 ENCOUNTER — Other Ambulatory Visit: Payer: Self-pay

## 2019-01-23 ENCOUNTER — Other Ambulatory Visit: Payer: Medicare Other | Admitting: *Deleted

## 2019-01-23 DIAGNOSIS — E785 Hyperlipidemia, unspecified: Secondary | ICD-10-CM

## 2019-01-23 DIAGNOSIS — I2510 Atherosclerotic heart disease of native coronary artery without angina pectoris: Secondary | ICD-10-CM

## 2019-01-23 LAB — HEPATIC FUNCTION PANEL
ALT: 17 IU/L (ref 0–44)
AST: 22 IU/L (ref 0–40)
Albumin: 4.3 g/dL (ref 3.7–4.7)
Alkaline Phosphatase: 100 IU/L (ref 39–117)
Bilirubin Total: 0.7 mg/dL (ref 0.0–1.2)
Bilirubin, Direct: 0.19 mg/dL (ref 0.00–0.40)
Total Protein: 6.7 g/dL (ref 6.0–8.5)

## 2019-01-23 LAB — LIPID PANEL
Chol/HDL Ratio: 3.1 ratio (ref 0.0–5.0)
Cholesterol, Total: 139 mg/dL (ref 100–199)
HDL: 45 mg/dL (ref >39)
LDL Calculated: 59 mg/dL (ref 0–99)
Triglycerides: 175 mg/dL — ABNORMAL HIGH (ref 0–149)
VLDL Cholesterol Cal: 35 mg/dL (ref 5–40)

## 2019-01-23 LAB — BASIC METABOLIC PANEL
BUN/Creatinine Ratio: 10 (ref 10–24)
BUN: 17 mg/dL (ref 8–27)
CO2: 23 mmol/L (ref 20–29)
Calcium: 9.3 mg/dL (ref 8.6–10.2)
Chloride: 103 mmol/L (ref 96–106)
Creatinine, Ser: 1.69 mg/dL — ABNORMAL HIGH (ref 0.76–1.27)
GFR calc Af Amer: 46 mL/min/{1.73_m2} — ABNORMAL LOW (ref >59)
GFR calc non Af Amer: 40 mL/min/{1.73_m2} — ABNORMAL LOW (ref >59)
Glucose: 94 mg/dL (ref 65–99)
Potassium: 3.9 mmol/L (ref 3.5–5.2)
Sodium: 141 mmol/L (ref 134–144)

## 2019-10-05 ENCOUNTER — Other Ambulatory Visit: Payer: Self-pay | Admitting: Internal Medicine

## 2019-10-05 ENCOUNTER — Ambulatory Visit
Admission: RE | Admit: 2019-10-05 | Discharge: 2019-10-05 | Disposition: A | Discharge status: Home / Self Care | Payer: Medicare Other | Source: Ambulatory Visit | Attending: Internal Medicine | Admitting: Internal Medicine

## 2019-10-05 DIAGNOSIS — S6991XA Unspecified injury of right wrist, hand and finger(s), initial encounter: Secondary | ICD-10-CM

## 2019-12-29 ENCOUNTER — Other Ambulatory Visit: Payer: Self-pay

## 2019-12-29 MED ORDER — EZETIMIBE 10 MG PO TABS: 10.0000 mg | ORAL_TABLET | Freq: Every day | ORAL | 0 refills | Status: DC

## 2019-12-29 MED ORDER — METOPROLOL TARTRATE 25 MG PO TABS: 25.0000 mg | ORAL_TABLET | Freq: Two times a day (BID) | ORAL | 0 refills | Status: DC

## 2020-01-28 ENCOUNTER — Other Ambulatory Visit: Payer: Self-pay | Admitting: Cardiology

## 2020-03-08 ENCOUNTER — Encounter: Payer: Self-pay | Admitting: Cardiology

## 2020-03-08 ENCOUNTER — Other Ambulatory Visit: Payer: Self-pay

## 2020-03-08 ENCOUNTER — Ambulatory Visit (INDEPENDENT_AMBULATORY_CARE_PROVIDER_SITE_OTHER): Payer: Medicare Other | Admitting: Cardiology

## 2020-03-08 VITALS — BP 144/84 | HR 77 | Ht 66.0 in | Wt 220.0 lb

## 2020-03-08 DIAGNOSIS — I2510 Atherosclerotic heart disease of native coronary artery without angina pectoris: Secondary | ICD-10-CM

## 2020-03-08 DIAGNOSIS — I10 Essential (primary) hypertension: Secondary | ICD-10-CM

## 2020-03-08 DIAGNOSIS — E785 Hyperlipidemia, unspecified: Secondary | ICD-10-CM | POA: Diagnosis not present

## 2020-03-08 NOTE — Progress Notes (Signed)
Date:  03/08/2020   ID:  Kameron, Blethen 1945-08-26, MRN [id number]  PCP:  Marden Noble, MD  Cardiologist:  Armanda Magic, MD Electrophysiologist:  None   Chief Complaint:  CAD, HTN  History of Present Illness:    [name] [name] is a 74 y.o. male with a hx of ASCAD.  He initially presented with chest pain and underwent myoview which was neg for ischemia but Chest pain continued. Cardiac CT showed coronary calcium score 1951 and severe >70 % stenosis in prox LAD. Possible significant disease in the ostial RCA and LCX. It was felt that he could have balanced ischemia on nuc. Cath showed severe CAD and underwent CABG by Dr. Cornelius Moras 11/04/15 with LIMA to distal LAD, SVG to 1st OM of LCx, seq VG to 2nd OM of LCX. He had normal LV systolic function.   He is here today for followup and is doing well.  He denies any chest pain or pressure, SOB, DOE, PND, orthopnea, LE edema, dizziness, palpitations or syncope. He is compliant with his meds and is tolerating meds with no SE.  He does not get any routine exercise except for playing golf.   Prior CV studies:   The following studies were reviewed today:  Labs, EKG  Past Medical History:  Diagnosis Date  . BPH associated with nocturia   . CAD (coronary artery disease), native coronary artery    CARDIOLOGIST-  DR Gloris Manchester Lailie Smead--  normal myoview 03/ 2017 but Cardiac CT 04/ 2017 calcium score 1951, >70% LAD stenosis possible disease RCA , LCX--- cath done showed severe 3V disease S/P  CABG 04/ 2017  . Chronic back pain    herniated disc  . CKD (chronic kidney disease), stage III   . Complication of anesthesia    POST-OP URINARY RETENTION 08/ 2013  . Diverticulosis   . Gout    as of 03-20-2017 per pt stable  . History of kidney stones    METABOLIC STONE DISEASE  . History of urinary retention    POST OP SURGERY 08/ 2013  . Hypercalciuria   . Hyperlipidemia    takes Crestor daily  . Hypertension   . LBBB (left bundle branch block)   .  Muscle cramps    legs occasonally in am  . Renal calculus, right   . Renal cyst, right   . 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 Seasonal allergies    Past Surgical History:  Procedure Laterality Date  . CARDIAC CATHETERIZATION N/A 10/28/2015   Procedure: Left Heart Cath and Coronary Angiography;  Surgeon: [name], MD;  [facility]: [facility] INVASIVE CV LAB;  Service: Cardiovascular;  Laterality: N/A;  . CARDIAC CATHETERIZATION  05-21-1995  dr Verdis Prime   non-obstructive cad;  normal lvf  . CARDIOVASCULAR STRESS TEST  09/23/2015  dr Gloris Manchester Damian [facility]stra   Normal nuclear study w/ no prior infarct and no ischemia/  normal LV function and wall motion , nuclear stress ef 54%  . COLONOSCOPY  last one 2009  . CORONARY ARTERY BYPASS GRAFT N/A 11/04/2015   Procedure: CORONARY ARTERY BYPASS GRAFTING (CABG) times three using left internal mammary artery and right saphenous vein harvested with endoscope.;  Surgeon: [name], MD;  Location: [facility] OR;  Service: Open Heart Surgery;  Laterality: N/A;  . CYSTOSCOPY WITH RETROGRADE PYELOGRAM, URETEROSCOPY AND STENT PLACEMENT Left 03/09/2017   Procedure: CYSTOSCOPY WITH RETROGRADE PYELOGRAM, URETEROSCOPY AND STENT  PLACEMENT;  Surgeon: Sebastian Ache, MD;  Location: [facility] ORS;  Service: Urology;  Laterality: Left;  . CYSTOSCOPY WITH URETEROSCOPY  07/28/2012   Procedure: CYSTOSCOPY WITH URETEROSCOPY;  Surgeon: [name], MD;  Location: [facility] OR[facility];  Service: Urology;  Laterality: Left;     . CYSTOSCOPY/URETEROSCOPY/HOLMIUM LASER/STENT PLACEMENT Right 03/25/2017   Procedure: CYSTOSCOPY RIGHT URETEROSCOPYVHOLMIUM LASER STENT PLACEMENT;  Surgeon: Crista Elliot, MD;  Location: [facility];  Service: Urology;  Laterality: Right;  . ESOPHAGOGASTRODUODENOSCOPY (EGD) WITH ESOPHAGEAL DILATION  05/ 2018 approx.  [name] EXTRACORPOREAL SHOCK WAVE LITHOTRIPSY  multiple last one 05-29-2012  . FOOT SURGERY  1990s    right-removed bone spur   . HOLMIUM LASER APPLICATION  07/28/2012   Procedure: HOLMIUM LASER APPLICATION;  Surgeon: [name], MD;  Location: [facility];  Service: Urology;  Laterality: Left;  left lithotripsy  . HOLMIUM LASER APPLICATION Left 03/09/2017   Procedure: HOLMIUM LASER APPLICATION;  Surgeon: Sebastian Ache, MD;  Location: [facility];  Service: Urology;  Laterality: Left;  . LAPAROSCOPIC APPENDECTOMY N/A 12/20/2018   Procedure: APPENDECTOMY LAPAROSCOPIC;  Surgeon: Axel Filler, MD;  Location: [facility];  Service: General;  Laterality: N/A;  . POSTERIOR LUMBAR FUSION  03/07/2012   w/ laminecotmy L4-5  . PYELOLITHOTOMY Right 01/1982  . TEE WITHOUT CARDIOVERSION N/A 11/04/2015   Procedure: TRANSESOPHAGEAL ECHOCARDIOGRAM (TEE);  Surgeon: [name], MD;  Location: [facility] OR;  Service: Open Heart Surgery;  Laterality: N/A;  . TONSILLECTOMY  age 33  . URETEROLITHOTOMY Right 03/1994     Current Meds  Medication Sig  . allopurinol (ZYLOPRIM) 100 MG tablet Take 100 mg by mouth 2 (two) times daily.   [name] aspirin EC 81 MG EC tablet Take 1 tablet (81 mg total) by mouth daily.  . cholecalciferol (VITAMIN D) 1000 UNITS tablet Take 1,000 Units by mouth every morning.   . Coenzyme Q10 (COQ10) 100 MG CAPS Take 1 capsule by mouth every evening.   . ezetimibe (ZETIA) 10 MG tablet Take 1 tablet (10 mg total) by mouth daily. MUST KEEP APPT IN AUG TO CONT REFILLS. Thank you.  . fexofenadine (ALLEGRA) 180 MG tablet Take 180 mg by mouth daily as needed. (allergies)  . metoprolol tartrate (LOPRESSOR) 25 MG tablet Take 1 tablet (25 mg total) by mouth 2 (two) times daily. MUST KEEP APPT IN AUG TO CONT REFILLS. Thank you.  . Multiple Vitamin (MULTIVITAMIN WITH MINERALS) TABS Take 1 tablet by mouth every morning.   . naproxen sodium (ALEVE) 220 MG tablet Take 220 mg by mouth daily as needed (pain).  [name] omeprazole (PRILOSEC) 20 MG capsule Take 20 mg by mouth every evening.  [name] oxybutynin (DITROPAN-XL) 5 MG 24 hr tablet  Take 5 mg by mouth at bedtime.  . pravastatin (PRAVACHOL) 40 MG tablet TK 1 T PO QPM  . solifenacin (VESICARE) 5 MG tablet Take 5 mg by mouth daily.  . Tamsulosin HCl (FLOMAX) 0.4 MG CAPS Take 0.4 mg by mouth daily after supper.  . telmisartan (MICARDIS) 20 MG tablet Take 1 tablet (20 mg total) by mouth every morning.     Allergies:   Atorvastatin, Hctz [hydrochlorothiazide], and Rosuvastatin   Social History   Tobacco Use  . Smoking status: Never Smoker  . Smokeless tobacco: Never Used  Vaping Use  . Vaping Use: Never used  Substance Use Topics  . Alcohol use: No    Alcohol/week: 0.0 standard drinks  . Drug use: No     Family Hx: The patient's  family history includes Dementia in his mother; Prostate cancer in his father.  ROS:   Please see the history of present illness.     All other systems reviewed and are negative.   Labs/Other Tests and Data Reviewed:    Recent Labs: No results found for requested labs within last 8760 hours.   Recent Lipid Panel Lab Results  Component Value Date/Time   CHOL 139 01/23/2019 08:40 AM   TRIG 175 (H) 01/23/2019 08:40 AM   HDL 45 01/23/2019 08:40 AM   CHOLHDL 3.1 01/23/2019 08:40 AM   CHOLHDL 3.4 12/19/2015 10:05 AM   LDLCALC 59 01/23/2019 08:40 AM    Wt Readings from Last 3 Encounters:  03/08/20 220 lb (99.8 kg)  12/20/18 216 lb 14.9 oz (98.4 kg)  12/11/18 215 lb (97.5 kg)     Objective:    Vital Signs:  BP (!) 144/84   Pulse 77   Ht 5\' 6"  (1.676 m)   Wt 220 lb (99.8 kg)   SpO2 94%   BMI 35.51 kg/m    CONSTITUTIONAL:  Well nourished, well developed male in no acute distress.  EYES: anicteric MOUTH: oral mucosa is pink RESPIRATORY: Normal respiratory effort, symmetric expansion CARDIOVASCULAR: No peripheral edema SKIN: No rash, lesions or ulcers MUSCULOSKELETAL: no digital cyanosis NEURO: Cranial Nerves II-XII grossly intact, moves all extremities PSYCH: Intact judgement and insight.  A&O x 3, Mood/affect  appropriate  EKG was performed today and showed NSR at 77bpm with nonspecific IVCD  ASSESSMENT & PLAN:    1.  ASCAD  - cath 2017 showed 80% mid left circumflex, 80% ostial LAD, 60% left main, 75% ostial left circumflex, 75% ramus, 40% ostial RCA.   -He is status post CABG with LIMA to the LAD, SVG to 1st OM of LCx, seq VG to 2nd OM of LCX .   -he denies any anginal symptoms -Continue aspirin 81 mg daily, BB and pravastatin 40 mg.  2.  Hypertension  -Bp is borderline elevated on exam today -He will continue on telmisartan 20 mg daily and Lopressor 25mg  BID -check Bp daily for a week and call with results.   -check BMET  3.  Hyperlipidemia  -his LDL goal is less than 70.   -He will continue on pravastatin 40 mg daily and zetia 10mg  daily.   -I will repeat an FLP and ALT.  4.  Chronic kidney disease stage III  -this is followed by his PCP.     Medication Adjustments/Labs and Tests Ordered: Current medicines are reviewed at length with the patient today.  Concerns regarding medicines are outlined above.  Tests Ordered: No orders of the defined types were placed in this encounter.  Medication Changes: No orders of the defined types were placed in this encounter.   Disposition:  Follow up in 1 year(s)  Signed, 2018, MD  03/08/2020 3:07 PM    [facility]

## 2020-03-08 NOTE — Addendum Note (Signed)
Addended by: Theresia Majors on: 03/08/2020 03:28 PM   Modules accepted: Orders

## 2020-03-08 NOTE — Patient Instructions (Signed)
Medication Instructions:  Your physician recommends that you continue on your current medications as directed. Please refer to the Current Medication list given to you today.  *If you need a refill on your cardiac medications before your next appointment, please call your pharmacy*   Lab Work: Fasting lipids and CMET If you have labs (blood work) drawn today and your tests are completely normal, you will receive your results only by: . MyChart Message (if you have MyChart) OR . A paper copy in the mail If you have any lab test that is abnormal or we need to change your treatment, we will call you to review the results.   Follow-Up: At [facility], you and your health needs are our priority.  As part of our continuing mission to provide you with exceptional heart care, we have created designated Provider Care Teams.  These Care Teams include your primary Cardiologist (physician) and Advanced Practice Providers (APPs -  Physician Assistants and Nurse Practitioners) who all work together to provide you with the care you need, when you need it.    Your next appointment:   1 year(s)  The format for your next appointment:   In Person  Provider:   You may see Traci Turner, MD or one of the following Advanced Practice Providers on your designated Care Team:    Dayna Dunn, PA-C  Michele Lenze, PA-C    

## 2020-03-09 ENCOUNTER — Other Ambulatory Visit: Payer: Medicare Other | Admitting: *Deleted

## 2020-03-09 DIAGNOSIS — E785 Hyperlipidemia, unspecified: Secondary | ICD-10-CM

## 2020-03-09 LAB — LIPID PANEL
Chol/HDL Ratio: 3.3 ratio (ref 0.0–5.0)
Cholesterol, Total: 140 mg/dL (ref 100–199)
HDL: 43 mg/dL (ref >39)
LDL Chol Calc (NIH): 67 mg/dL (ref 0–99)
Triglycerides: 182 mg/dL — ABNORMAL HIGH (ref 0–149)
VLDL Cholesterol Cal: 30 mg/dL (ref 5–40)

## 2020-03-09 LAB — CBC
Hematocrit: 43 % (ref 37.5–51.0)
Hemoglobin: 14 g/dL (ref 13.0–17.7)
MCH: 30.3 pg (ref 26.6–33.0)
MCHC: 32.6 g/dL (ref 31.5–35.7)
MCV: 93 fL (ref 79–97)
Platelets: 132 10*3/uL — ABNORMAL LOW (ref 150–450)
RBC: 4.62 x10E6/uL (ref 4.14–5.80)
RDW: 13.8 % (ref 11.6–15.4)
WBC: 7.1 10*3/uL (ref 3.4–10.8)

## 2020-03-10 ENCOUNTER — Other Ambulatory Visit: Payer: Self-pay

## 2020-03-10 ENCOUNTER — Telehealth: Payer: Self-pay

## 2020-03-10 ENCOUNTER — Other Ambulatory Visit: Payer: Medicare Other | Admitting: *Deleted

## 2020-03-10 DIAGNOSIS — I10 Essential (primary) hypertension: Secondary | ICD-10-CM

## 2020-03-10 DIAGNOSIS — E785 Hyperlipidemia, unspecified: Secondary | ICD-10-CM

## 2020-03-10 DIAGNOSIS — I2510 Atherosclerotic heart disease of native coronary artery without angina pectoris: Secondary | ICD-10-CM

## 2020-03-10 MED ORDER — ICOSAPENT ETHYL 1 G PO CAPS: 2.0000 g | ORAL_CAPSULE | Freq: Two times a day (BID) | ORAL | 3 refills | Status: DC

## 2020-03-10 NOTE — Telephone Encounter (Signed)
-----   Message from Quintella Reichert, MD sent at 03/10/2020 11:59 AM EDT ----- TAGS too high - please have him come in for TSH and HbA1C.  Please start Vascepa 2gm BID and repeat FLP and ALT In 3 months

## 2020-03-10 NOTE — Telephone Encounter (Signed)
The patient has been notified of the result and verbalized understanding.  All questions (if any) were answered. Theresia Majors, RN 03/10/2020 1:31 PM

## 2020-03-11 LAB — LIPID PANEL
Chol/HDL Ratio: 3.4 ratio (ref 0.0–5.0)
Cholesterol, Total: 147 mg/dL (ref 100–199)
HDL: 43 mg/dL (ref >39)
LDL Chol Calc (NIH): 69 mg/dL (ref 0–99)
Triglycerides: 209 mg/dL — ABNORMAL HIGH (ref 0–149)
VLDL Cholesterol Cal: 35 mg/dL (ref 5–40)

## 2020-03-11 LAB — HEMOGLOBIN A1C
Est. average glucose Bld gHb Est-mCnc: 126 mg/dL
Hgb A1c MFr Bld: 6 % — ABNORMAL HIGH (ref 4.8–5.6)

## 2020-03-11 LAB — TSH: TSH: 1.19 u[IU]/mL (ref 0.450–4.500)

## 2020-03-11 LAB — ALT: ALT: 26 IU/L (ref 0–44)

## 2020-04-27 ENCOUNTER — Other Ambulatory Visit: Payer: Self-pay | Admitting: Cardiology

## 2020-06-03 ENCOUNTER — Other Ambulatory Visit: Payer: Medicare Other | Admitting: *Deleted

## 2020-06-03 ENCOUNTER — Other Ambulatory Visit: Payer: Self-pay

## 2020-06-03 DIAGNOSIS — E785 Hyperlipidemia, unspecified: Secondary | ICD-10-CM

## 2020-06-03 LAB — LIPID PANEL
Chol/HDL Ratio: 3.3 ratio (ref 0.0–5.0)
Cholesterol, Total: 146 mg/dL (ref 100–199)
HDL: 44 mg/dL (ref >39)
LDL Chol Calc (NIH): 76 mg/dL (ref 0–99)
Triglycerides: 147 mg/dL (ref 0–149)
VLDL Cholesterol Cal: 26 mg/dL (ref 5–40)

## 2020-06-03 LAB — ALT: ALT: 25 IU/L (ref 0–44)

## 2020-06-06 ENCOUNTER — Telehealth: Payer: Self-pay

## 2020-06-06 MED ORDER — PRAVASTATIN SODIUM 80 MG PO TABS: 80.0000 mg | ORAL_TABLET | Freq: Every evening | ORAL | 3 refills | Status: DC

## 2020-06-06 NOTE — Telephone Encounter (Signed)
-----   Message from Olene Floss, RPH-CPP sent at 06/06/2020 11:15 AM EST ----- LDL is close to goal of <70. Could consider increasing Pravastatin to 80mg . Patient did not tolerate rosuvastatin or atrovastatin. Continue Vascepa 2g BID and zetia 10mg  daily.

## 2021-03-14 ENCOUNTER — Other Ambulatory Visit: Payer: Self-pay | Admitting: Cardiology

## 2021-04-20 ENCOUNTER — Other Ambulatory Visit: Payer: Self-pay

## 2021-04-20 ENCOUNTER — Ambulatory Visit (INDEPENDENT_AMBULATORY_CARE_PROVIDER_SITE_OTHER): Payer: Medicare Other | Admitting: Cardiology

## 2021-04-20 ENCOUNTER — Encounter: Payer: Self-pay | Admitting: Cardiology

## 2021-04-20 VITALS — BP 130/86 | HR 73 | Ht 66.0 in | Wt 212.6 lb

## 2021-04-20 DIAGNOSIS — E785 Hyperlipidemia, unspecified: Secondary | ICD-10-CM

## 2021-04-20 DIAGNOSIS — I2510 Atherosclerotic heart disease of native coronary artery without angina pectoris: Secondary | ICD-10-CM

## 2021-04-20 DIAGNOSIS — I10 Essential (primary) hypertension: Secondary | ICD-10-CM | POA: Diagnosis not present

## 2021-04-20 LAB — COMPREHENSIVE METABOLIC PANEL
ALT: 21 IU/L (ref 0–44)
AST: 26 IU/L (ref 0–40)
Albumin/Globulin Ratio: 1.4 (ref 1.2–2.2)
Albumin: 4.1 g/dL (ref 3.7–4.7)
Alkaline Phosphatase: 94 IU/L (ref 44–121)
BUN/Creatinine Ratio: 11 (ref 10–24)
BUN: 17 mg/dL (ref 8–27)
Bilirubin Total: 0.8 mg/dL (ref 0.0–1.2)
CO2: 23 mmol/L (ref 20–29)
Calcium: 9.6 mg/dL (ref 8.6–10.2)
Chloride: 104 mmol/L (ref 96–106)
Creatinine, Ser: 1.51 mg/dL — ABNORMAL HIGH (ref 0.76–1.27)
Globulin, Total: 2.9 g/dL (ref 1.5–4.5)
Glucose: 98 mg/dL (ref 70–99)
Potassium: 3.9 mmol/L (ref 3.5–5.2)
Sodium: 141 mmol/L (ref 134–144)
Total Protein: 7 g/dL (ref 6.0–8.5)
eGFR: 48 mL/min/{1.73_m2} — ABNORMAL LOW (ref >59)

## 2021-04-20 LAB — LIPID PANEL
Chol/HDL Ratio: 3.5 ratio (ref 0.0–5.0)
Cholesterol, Total: 147 mg/dL (ref 100–199)
HDL: 42 mg/dL (ref >39)
LDL Chol Calc (NIH): 75 mg/dL (ref 0–99)
Triglycerides: 177 mg/dL — ABNORMAL HIGH (ref 0–149)
VLDL Cholesterol Cal: 30 mg/dL (ref 5–40)

## 2021-04-20 MED ORDER — PRAVASTATIN SODIUM 80 MG PO TABS: 80.0000 mg | ORAL_TABLET | Freq: Every evening | ORAL | 3 refills | Status: DC

## 2021-04-20 MED ORDER — ICOSAPENT ETHYL 1 G PO CAPS: ORAL_CAPSULE | ORAL | 3 refills | Status: DC

## 2021-04-20 MED ORDER — EZETIMIBE 10 MG PO TABS: ORAL_TABLET | ORAL | 3 refills | Status: DC

## 2021-04-20 MED ORDER — AMLODIPINE BESYLATE 5 MG PO TABS: 5.0000 mg | ORAL_TABLET | Freq: Every day | ORAL | 3 refills | Status: DC

## 2021-04-20 MED ORDER — METOPROLOL TARTRATE 25 MG PO TABS: ORAL_TABLET | ORAL | 3 refills | Status: DC

## 2021-04-20 MED ORDER — TELMISARTAN 20 MG PO TABS
20.0000 mg | ORAL_TABLET | Freq: Every morning | ORAL | 3 refills | Status: DC
Start: 2021-04-20 — End: 2022-06-14

## 2021-04-20 NOTE — Progress Notes (Signed)
Date:  04/20/2021   ID:  [name] [name], [name] [name] [name] [name], [name] [name], MRN [id number]  PCP:  Marden Noble, MD  Cardiologist:  Armanda Magic, MD Electrophysiologist:  None   Chief Complaint:  CAD, HTN  History of Present Illness:    [name] [name] is a 75 y.o. male with a hx of ASCAD.  He initially presented with chest pain and underwent myoview which was neg for ischemia but Chest pain continued. Cardiac CT showed coronary calcium score 1951 and severe > 70 % stenosis in prox LAD.  Possible significant disease in the ostial RCA and LCX.  It was felt that he could have  balanced ischemia on nuc.   Cath showed severe CAD and underwent CABG by Dr. Cornelius Moras 11/04/15 with LIMA to distal LAD, SVG to 1st OM of LCx, seq VG to 2nd OM of LCX.  He had normal LV systolic function.    He is here today for followup and is doing well.  He denies any anginal chest pain or pressure, SOB, DOE, PND, orthopnea, LE edema, dizziness, palpitations or syncope. He is compliant with his meds and is tolerating meds with no SE.     Prior CV studies:   The following studies were reviewed today:  Labs, EKG  Past Medical History:  Diagnosis Date   BPH associated with nocturia    CAD (coronary artery disease), native coronary artery    CARDIOLOGIST-  DR Gloris Manchester Aarti Mankowski--  normal myoview 03/ 2017 but Cardiac CT 04/ 2017 calcium score 1951, >70% LAD stenosis possible disease RCA , LCX--- cath done showed severe 3V disease S/P  CABG 04/ 2017   Chronic back pain    herniated disc   CKD (chronic kidney disease), stage III (HCC)    Complication of anesthesia    POST-OP URINARY RETENTION 08/ 2013   Diverticulosis    Gout    as of 03-20-2017 per pt stable   History of kidney stones    METABOLIC STONE DISEASE   History of urinary retention    POST OP SURGERY 08/ 2013   Hypercalciuria    Hyperlipidemia    takes Crestor daily   Hypertension    LBBB (left bundle branch block)    Muscle cramps    legs occasonally in am   Renal calculus,  right    Renal cyst, right    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   LIMA to LAD, Sequential SVG to OM1-OM2, EVH via right thigh   Seasonal allergies    Past Surgical History:  Procedure Laterality Date   CARDIAC CATHETERIZATION N/A 10/28/2015   Procedure: Left Heart Cath and Coronary Angiography;  Surgeon: [name], MD;  Location: [facility] INVASIVE CV LAB;  Service: Cardiovascular;  Laterality: N/A;   CARDIAC CATHETERIZATION  05-21-1995  dr Verdis Prime   non-obstructive cad;  normal lvf   CARDIOVASCULAR STRESS TEST  09/23/2015  dr Gloris Manchester Kauri Garson   Normal nuclear study w/ no prior infarct and no ischemia/  normal LV function and wall motion , nuclear stress ef 54%   COLONOSCOPY  last one 2009   CORONARY ARTERY BYPASS GRAFT N/A 11/04/2015   Procedure: CORONARY ARTERY BYPASS GRAFTING (CABG) times three using left internal mammary artery and right saphenous vein harvested with endoscope.;  Surgeon: [name], MD;  Location: [facility] OR;  Service: Open Heart Surgery;  Laterality: N/A;   CYSTOSCOPY WITH RETROGRADE PYELOGRAM, URETEROSCOPY AND STENT PLACEMENT Left 03/09/2017   Procedure: CYSTOSCOPY WITH RETROGRADE PYELOGRAM, URETEROSCOPY AND STENT  PLACEMENT;  Surgeon: Sebastian Ache, MD;  Location: [facility] ORS;  Service: Urology;  Laterality: Left;   CYSTOSCOPY WITH URETEROSCOPY  07/28/2012   Procedure: CYSTOSCOPY WITH URETEROSCOPY;  Surgeon: [name], MD;  Location: [facility];  Service: Urology;  Laterality: Left;      CYSTOSCOPY/URETEROSCOPY/HOLMIUM LASER/STENT PLACEMENT Right 03/25/2017   Procedure: CYSTOSCOPY RIGHT URETEROSCOPYVHOLMIUM LASER STENT PLACEMENT;  Surgeon: Crista Elliot, MD;  Location: [facility];  Service: Urology;  Laterality: Right;   ESOPHAGOGASTRODUODENOSCOPY (EGD) WITH ESOPHAGEAL DILATION  05/ 2018 approx.   EXTRACORPOREAL SHOCK WAVE LITHOTRIPSY  multiple last one 05-29-2012   FOOT SURGERY  1990s   right-removed bone spur    HOLMIUM LASER APPLICATION  07/28/2012   Procedure:  HOLMIUM LASER APPLICATION;  Surgeon: [name], MD;  Location: [facility];  Service: Urology;  Laterality: Left;  left lithotripsy   HOLMIUM LASER APPLICATION Left 03/09/2017   Procedure: HOLMIUM LASER APPLICATION;  Surgeon: Sebastian Ache, MD;  Location: [facility];  Service: Urology;  Laterality: Left;   LAPAROSCOPIC APPENDECTOMY N/A 12/20/2018   Procedure: APPENDECTOMY LAPAROSCOPIC;  Surgeon: Axel Filler, MD;  Location: [facility];  Service: General;  Laterality: N/A;   POSTERIOR LUMBAR FUSION  03/07/2012   w/ laminecotmy L4-5   PYELOLITHOTOMY Right 01/1982   TEE WITHOUT CARDIOVERSION N/A 11/04/2015   Procedure: TRANSESOPHAGEAL ECHOCARDIOGRAM (TEE);  Surgeon: [name], MD;  Location: [facility] OR;  Service: Open Heart Surgery;  Laterality: N/A;   TONSILLECTOMY  age 80   URETEROLITHOTOMY Right 03/1994     Current Meds  Medication Sig   allopurinol (ZYLOPRIM) 100 MG tablet Take 100 mg by mouth 2 (two) times daily.    amLODipine (NORVASC) 5 MG tablet Take 5 mg by mouth daily.   aspirin EC 81 MG EC tablet Take 1 tablet (81 mg total) by mouth daily.   cholecalciferol (VITAMIN D) 1000 UNITS tablet Take 1,000 Units by mouth every morning.    Coenzyme Q10 (COQ10) 100 MG CAPS Take 1 capsule by mouth every evening.    ezetimibe (ZETIA) 10 MG tablet TAKE 1 TABLET(10 MG) BY MOUTH DAILY   fexofenadine (ALLEGRA) 180 MG tablet Take 180 mg by mouth daily as needed. (allergies)   icosapent Ethyl (VASCEPA) 1 g capsule TAKE 2 CAPSULES(2 GRAMS) BY MOUTH TWICE DAILY   metoprolol tartrate (LOPRESSOR) 25 MG tablet TAKE 1 TABLET(25 MG) BY MOUTH TWICE DAILY   Multiple Vitamin (MULTIVITAMIN WITH MINERALS) TABS Take 1 tablet by mouth every morning.    naproxen sodium (ALEVE) 220 MG tablet Take 220 mg by mouth daily as needed (pain).   omeprazole (PRILOSEC) 20 MG capsule Take 20 mg by mouth every evening.   pravastatin (PRAVACHOL) 80 MG tablet Take 1 tablet (80 mg total) by mouth every evening.   Tamsulosin HCl (FLOMAX)  0.4 MG CAPS Take 0.4 mg by mouth daily after supper.   telmisartan (MICARDIS) 20 MG tablet Take 1 tablet (20 mg total) by mouth every morning.     Allergies:   Atorvastatin, Hctz [hydrochlorothiazide], and Rosuvastatin   Social History   Tobacco Use   Smoking status: Never   Smokeless tobacco: Never  Vaping Use   Vaping Use: Never used  Substance Use Topics   Alcohol use: No    Alcohol/week: 0.0 standard drinks   Drug use: No     Family Hx: The patient's family history includes Dementia in his mother; Prostate cancer in his father.  ROS:   Please see the history of present illness.  All other systems reviewed and are negative.   Labs/Other Tests and Data Reviewed:    Recent Labs: 06/03/2020: ALT 25   Recent Lipid Panel Lab Results  Component Value Date/Time   CHOL 146 06/03/2020 08:38 AM   TRIG 147 06/03/2020 08:38 AM   HDL 44 06/03/2020 08:38 AM   CHOLHDL 3.3 06/03/2020 08:38 AM   CHOLHDL 3.4 [name]/11/2015 10:05 AM   LDLCALC 76 06/03/2020 08:38 AM    Wt Readings from Last 3 Encounters:  10/[name]/22 212 lb 9.6 oz (96.4 kg)  03/08/20 220 lb (99.8 kg)  [name]/[name]/20 216 lb 14.9 oz (98.4 kg)     Objective:    Vital Signs:  BP 130/86   Pulse 73   Ht 5\' 6"  (1.676 m)   Wt 212 lb 9.6 oz (96.4 kg)   SpO2 94%   BMI 34.31 kg/m    GEN: Well nourished, well developed in no acute distress HEENT: Normal NECK: No JVD; No carotid bruits LYMPHATICS: No lymphadenopathy CARDIAC:RRR, no murmurs, rubs, gallops RESPIRATORY:  Clear to auscultation without rales, wheezing or rhonchi  ABDOMEN: Soft, non-tender, non-distended MUSCULOSKELETAL:  No edema; No deformity  SKIN: Warm and dry NEUROLOGIC:  Alert and oriented x 3 PSYCHIATRIC:  Normal affect   EKG was performed today and showed NSR with LBBB  ASSESSMENT & PLAN:    1.  ASCAD  - cath 2017 showed 80% mid left circumflex, 80% ostial LAD, 60% left main, 75% ostial left circumflex, 75% ramus, 40% ostial RCA.   -He is  status post CABG with LIMA to the LAD, SVG to 1st OM of LCx, seq VG to 2nd OM of LCX .   -He has not had any anginal sx -Continue aspirin 81 mg daily, BB and statin  2.  Hypertension  -Bp controlled on exam today -continue prescription drug management with Telmisartan 20mg  daily, Amlodipine 5mg  daily and Lopressor 25mg  BID>refilled -check BMET  3.  Hyperlipidemia  -his LDL goal is < 70 -continue prescription drug management with Pravastatin 80mg  daily, Vascepa 2gm BID and Zetia 10mg  daily > refilled -check FLP and ALT   Medication Adjustments/Labs and Tests Ordered: Current medicines are reviewed at length with the patient today.  Concerns regarding medicines are outlined above.  Tests Ordered: Orders Placed This Encounter  Procedures   EKG 12-Lead    Medication Changes: No orders of the defined types were placed in this encounter.   Disposition:  Follow up in 1 year(s)  Signed, 2018, MD  04/20/2021 10:17 AM    [facility]

## 2021-04-20 NOTE — Patient Instructions (Signed)
Medication Instructions:  Your physician recommends that you continue on your current medications as directed. Please refer to the Current Medication list given to you today.   Refills have been sent in to your pharmacy for your Cardiac medications.  *If you need a refill on your cardiac medications before your next appointment, please call your pharmacy*   Lab Work: TODAY: CMET, FASTING LIPIDS If you have labs (blood work) drawn today and your tests are completely normal, you will receive your results only by: MyChart Message (if you have MyChart) OR A paper copy in the mail If you have any lab test that is abnormal or we need to change your treatment, we will call you to review the results.   Testing/Procedures: NONE   Follow-Up: At [facility], you and your health needs are our priority.  As part of our continuing mission to provide you with exceptional heart care, we have created designated Provider Care Teams.  These Care Teams include your primary Cardiologist (physician) and Advanced Practice Providers (APPs -  Physician Assistants and Nurse Practitioners) who all work together to provide you with the care you need, when you need it.  We recommend signing up for the patient portal called "MyChart".  Sign up information is provided on this After Visit Summary.  MyChart is used to connect with patients for Virtual Visits (Telemedicine).  Patients are able to view lab/test results, encounter notes, upcoming appointments, etc.  Non-urgent messages can be sent to your provider as well.   To learn more about what you can do with MyChart, go to [email or web address].    Your next appointment:   1 year(s)  The format for your next appointment:   In Person  Provider:   You may see Armanda Magic, MD or one of the following Advanced Practice Providers on your designated Care Team:   Ronie Spies, PA-C Jacolyn Reedy, PA-C

## 2021-05-03 NOTE — Progress Notes (Signed)
Patient ID: [name] [name]                 DOB: 1946-07-03                    MRN: [id number]     HPI: [name] [name] is a 75 y.o. male patient referred to lipid clinic after last seen by Dr. Mayford Knife on 04/20/21. PMH is significant for ASCVD with coronary calcium score of 1951 on 10/18/2015 (97th percentile for age and sex matched control) with severe > 70% stenosis in the prox LAD. Cath 10/28/15 showed stenosis in 80% mid left circumflex, 80% ostial LAD, 60% left main, 75% ostial left circumflex, 75% ramus, 40% ostial RCA.  He subsequently underwent CABG 11/04/15 with LIMA to the LAD, SVG to 1st OM of LCx, seq VG to 2nd OM of LCX. He also has HTN and CKD III. He has been referred to lipid clinic based on elevated LDL and hx of statin intolerance.  Today, patient presents to lipid clinic to discuss management options. He reports taking all his lipid medications as prescribed. He reports lower leg muscle cramps a few times a week on pravastatin and ezetimibe but this is tolerable for him. He mentions taking Vascepa 2g BID on an empty stomach. His wife is battling cancer with partial vision loss, so they have been eating take-out most of the week.   Current Medications: pravastatin 80 mg daily, ezetimibe 10 mg daily, Vascepa 2 g BID Other Medications coenzyme Q10 100 mg qhs Intolerances: rosuvastatin 5 mg daily (muscle cramps), atorvastatin 10 mg daily (dizziness, dyspepsia, body cramps) Risk Factors: ASCVD, CKD III LDL goal: LDL <55  Diet: reports eating poorly recently due to caring for wife with cancer  Breakfast - cereal, bacon biscuit with cheese and hash brown Evening - take out and fast food - wife   Exercise - play golf 2x a week, take care of yard work   Family History: family history includes Dementia in his mother; Prostate cancer in his father.  Social History:  reports that he has never smoked. He has never used smokeless tobacco. He reports that he does not drink alcohol and does not use  drugs.  Labs:  04/20/21 TC 147 TG 177 HDL 42 LDL 75, AST 26 ALT 21 (pravastatin 80 mg daily, Vascepa 2g BID, ezetimibe 10 mg daily) 02/2020 TG 182 (pravastatin 80 mg daily, Vascepa 2g BID, ezetimibe 10 mg daily) 08/2016 TG 202 (pravastatin 40 mg daily,  ezetimibe 10 mg daily)   Past Medical History:  Diagnosis Date   BPH associated with nocturia    CAD (coronary artery disease), native coronary artery    CARDIOLOGIST-  DR Gloris Manchester TURNER--  normal myoview 03/ 2017 but Cardiac CT 04/ 2017 calcium score 1951, >70% LAD stenosis possible disease RCA , LCX--- cath done showed severe 3V disease S/P  CABG 04/ 2017   Chronic back pain    herniated disc   CKD (chronic kidney disease), stage III (HCC)    Complication of anesthesia    POST-OP URINARY RETENTION 08/ 2013   Diverticulosis    Gout    as of 03-20-2017 per pt stable   History of kidney stones    METABOLIC STONE DISEASE   History of urinary retention    POST OP SURGERY 08/ 2013   Hypercalciuria    Hyperlipidemia    takes Crestor daily   Hypertension    LBBB (left bundle branch block)  Muscle cramps    legs occasonally in am   Renal calculus, right    Renal cyst, right    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      amLODipine (NORVASC) 5 MG tablet Take 1 tablet (5 mg total) by mouth daily. 90 tablet 3   aspirin EC 81 MG EC tablet Take 1 tablet (81 mg total) by mouth daily.     cholecalciferol (VITAMIN D) 1000 UNITS tablet Take 1,000 Units by mouth every morning.      Coenzyme Q10 (COQ10) 100 MG CAPS Take 1 capsule by mouth every evening.      ezetimibe (ZETIA) 10 MG tablet TAKE 1 TABLET(10 MG) BY MOUTH DAILY 90 tablet 3   fexofenadine (ALLEGRA) 180 MG tablet Take 180 mg by mouth daily as  needed. (allergies)     icosapent Ethyl (VASCEPA) 1 g capsule TAKE 2 CAPSULES(2 GRAMS) BY MOUTH TWICE DAILY 360 capsule 3   metoprolol tartrate (LOPRESSOR) 25 MG tablet TAKE 1 TABLET(25 MG) BY MOUTH TWICE DAILY 180 tablet 3   Multiple Vitamin (MULTIVITAMIN WITH MINERALS) TABS Take 1 tablet by mouth every morning.      naproxen sodium (ALEVE) 220 MG tablet Take 220 mg by mouth daily as needed (pain).     omeprazole (PRILOSEC) 20 MG capsule Take 20 mg by mouth every evening.     oxybutynin (DITROPAN-XL) 5 MG 24 hr tablet Take 5 mg by mouth at bedtime.     pravastatin (PRAVACHOL) 80 MG tablet Take 1 tablet (80 mg total) by mouth every evening. 90 tablet 3   solifenacin (VESICARE) 5 MG tablet Take 5 mg by mouth daily.     Tamsulosin HCl (FLOMAX) 0.4 MG CAPS Take 0.4 mg by mouth daily after supper.     telmisartan (MICARDIS) 20 MG tablet Take 1 tablet (20 mg total) by mouth every morning. 90 tablet 3   No current facility-administered medications on file prior to visit.    Allergies  Allergen Reactions   Atorvastatin Other (See Comments)    Reaction: dizziness/ dyspepsia. Muscle cramps all over body.   Hctz [Hydrochlorothiazide] Other (See Comments)    Reaction: arthralgias/ skin burning   Rosuvastatin Other (See Comments)    Reaction: muscle cramping    Assessment/Plan:  1. Hyperlipidemia - LDL 75 on pravastatin 80mg  daily, Vascepa 2g BID, ezetimibe 10 mg daily, above goal of <55 given hx of ASCVD and CKD. Discussed PCSK9i today which pt is agreeable to. Will submit prior authorization for Repatha [facility] Medicare is primary, [employer] is secondary insurance) and will continue pravastatin 80mg  daily but discontinue ezetimibe once he starts on Repatha. Will continue Vascepa 2 g BID however encouraged patient to take with food to improve absorption and efficacy. Follow-up fasting lipid panel scheduled on Dec 15.   Counseled patient to aim for <150 min/week of physical activity and to reduce intake  of saturated fats by eating fewer bacon biscuits for breakfast.   Pharm D. Candidate  [facility] [address]  [place] E. Supple, PharmD, BCACP, CPP [facility] [address] [place], [address] [place] Phone: [phone]; Fax: [phone] 05/04/2021 3:53 PM

## 2021-05-04 ENCOUNTER — Other Ambulatory Visit: Payer: Self-pay

## 2021-05-04 ENCOUNTER — Ambulatory Visit (INDEPENDENT_AMBULATORY_CARE_PROVIDER_SITE_OTHER): Payer: Medicare Other | Admitting: Pharmacist

## 2021-05-04 DIAGNOSIS — E785 Hyperlipidemia, unspecified: Secondary | ICD-10-CM

## 2021-05-04 NOTE — Patient Instructions (Addendum)
It was nice to meet you today!  Your LDL is 75 and your goal is < 55  Take Vascepa 2 g twice a day with food. Continue taking pravastatin 80 mg daily  I will submit information to your insurance for Repatha and let you know when I hear back.    Repatha is a subcutaneous injection given once every 2 weeks in the fatty tissue of your stomach or upper outer thigh. Store the medication in the fridge. You can let your dose warm up to room temperature for 30 minutes before injecting if you prefer. Repatha will lower your LDL cholesterol by 60% and helps to lower your chance of having a heart attack or stroke.  When you start Repatha, we can stop your ezetimibe  Follow-up for fasting lab work on Dec 15 any time after 7:30am  Reduce intake of biscuits

## 2021-05-05 ENCOUNTER — Telehealth: Payer: Self-pay | Admitting: Pharmacist

## 2021-05-05 DIAGNOSIS — E785 Hyperlipidemia, unspecified: Secondary | ICD-10-CM

## 2021-05-05 MED ORDER — REPATHA SURECLICK 140 MG/ML ~~LOC~~ SOAJ: 1.0000 "pen " | SUBCUTANEOUS | 3 refills | Status: DC

## 2021-05-05 NOTE — Telephone Encounter (Signed)
Called pt and confirmed PA for Repatha was approved and able to be picked up at pharmacy.  Pharmacy was not previously running medications through secondary [employer] -- lowered copay of Repatha from $177 for 90-day supply to $0 for 30 day supply - [employer] will only cover 30 days at a time

## 2021-06-11 ENCOUNTER — Other Ambulatory Visit: Payer: Self-pay | Admitting: Cardiology

## 2021-06-13 NOTE — Telephone Encounter (Signed)
Outpatient Medication Detail   Disp Refills Start End   metoprolol tartrate (LOPRESSOR) 25 MG tablet 180 tablet 3 04/20/2021    Sig: TAKE 1 TABLET(25 MG) BY MOUTH TWICE DAILY   Sent to pharmacy as: metoprolol tartrate (LOPRESSOR) 25 MG tablet   E-Prescribing Status: Receipt confirmed by pharmacy (04/20/2021 10:28 AM EDT)    Pharmacy  [id number] - [place], [place] - [address] AT [facility] [address]

## 2021-06-29 ENCOUNTER — Other Ambulatory Visit: Payer: Self-pay

## 2021-06-29 ENCOUNTER — Other Ambulatory Visit: Payer: Medicare Other

## 2021-06-29 DIAGNOSIS — E785 Hyperlipidemia, unspecified: Secondary | ICD-10-CM

## 2021-06-29 LAB — LIPID PANEL
Chol/HDL Ratio: 1.6 ratio (ref 0.0–5.0)
Cholesterol, Total: 84 mg/dL — ABNORMAL LOW (ref 100–199)
HDL: 51 mg/dL (ref >39)
LDL Chol Calc (NIH): 9 mg/dL (ref 0–99)
Triglycerides: 147 mg/dL (ref 0–149)
VLDL Cholesterol Cal: 24 mg/dL (ref 5–40)

## 2021-06-29 LAB — HEPATIC FUNCTION PANEL
ALT: 27 IU/L (ref 0–44)
AST: 30 IU/L (ref 0–40)
Albumin: 4.4 g/dL (ref 3.7–4.7)
Alkaline Phosphatase: 96 IU/L (ref 44–121)
Bilirubin Total: 0.4 mg/dL (ref 0.0–1.2)
Bilirubin, Direct: 0.15 mg/dL (ref 0.00–0.40)
Total Protein: 7 g/dL (ref 6.0–8.5)

## 2021-07-05 ENCOUNTER — Other Ambulatory Visit: Payer: Self-pay | Admitting: Cardiology

## 2021-09-22 ENCOUNTER — Encounter: Payer: Self-pay | Admitting: Cardiology

## 2021-09-22 NOTE — Telephone Encounter (Signed)
Patient reports that pressure only lasts a few seconds. Denies any associated nausea or diaphoresis. It is not similar to feelings prior to his bypass.  ?

## 2021-09-22 NOTE — Telephone Encounter (Signed)
Spoke with the patient who states that he has chest pressure at times. He states that he does not have any chest pain but describes it as a feeling of a someone's hand on his chest. He states that he has no other symptoms when this occurs and pressure resolves on its own. He notices it at rest. Does not occur when he does yard work. Denies any shortness of breath. No current chest pain. He became concerned after being told several times but other doctors that his blood pressure was elevated. He does have a blood pressure cuff at home but does not regularly check is BP. Encouraged him to start keeping a daily. Advised that I will talk with Dr. Mayford Knife for further recommendations.  ?ER precautions reviewed for chest pain/pressure.  ?

## 2021-09-25 MED ORDER — PANTOPRAZOLE SODIUM 40 MG PO TBEC: 40.0000 mg | DELAYED_RELEASE_TABLET | Freq: Two times a day (BID) | ORAL | 3 refills | Status: DC

## 2021-10-03 NOTE — Progress Notes (Signed)
? ?Cardiology Office Note   ? ?Date:  10/10/2021  ? ?ID:  [name] [name], DOB 06/05/1946, MRN [id number] ? ? ?PCP:  Josetta Huddle, MD ?  ?[place]  ?Cardiologist:  Fransico Him, MD   ?Advanced Practice Provider:  No care team member to display ?Electrophysiologist:  None  ? ?[id number]  ? ?Chief Complaint  ?Patient presents with  ? Follow-up  ? ? ?History of Present Illness:  ?[name] [name] is a 76 y.o. male with a hx of ASCAD.  He initially presented with chest pain and underwent myoview which was neg for ischemia but Chest pain continued. Cardiac CT showed coronary calcium score 1951 and severe > 70 % stenosis in prox LAD.  Possible significant disease in the ostial RCA and LCX.  It was felt that he could have  balanced ischemia on nuc.   Cath showed severe CAD and underwent CABG by Dr. Roxy Manns 11/04/15 with LIMA to distal LAD, SVG to 1st OM of LCx, seq VG to 2nd OM of LCX.  He had normal LV systolic function.   ? ?Last saw Dr. Radford Pax 04/20/21 and doing well.  ? ?Called in 09/22/21 with some chest pain and HTN. Patient was sitting in his recliner and had a little chest pressure and tingling and it resolved in 5 min spontaneously. Occurred again a week later. BP has been running high. Going through [place] for disability and has seen 3 different doctors.. He started checking at home and running 143-156/70-80's. No further chest pain. Dr. Radford Pax put him on protonix and no further chest pain. No regular exercise. Was golfing but hurt his back. Doing yard work without chest pain. Eating a lot of take out-all fast food, bbq and sandwiches. Wife has been ill with cancer.  ?  ? ? ? ?Past Medical History:  ?Diagnosis Date  ? BPH associated with nocturia   ? CAD (coronary artery disease), native coronary artery   ? CARDIOLOGIST-  DR Tressia Miners TURNER--  normal myoview 03/ 2017 but Cardiac CT 04/ 2017 calcium score 1951, >70% LAD stenosis possible disease RCA , LCX--- cath done showed severe 3V disease S/P  CABG 04/  2017  ? Chronic back pain   ? herniated disc  ? CKD (chronic kidney disease), stage III ([place])   ? Complication of anesthesia   ? POST-OP URINARY RETENTION 08/ 2013  ? Diverticulosis   ? Gout   ? as of 03-20-2017 per pt stable  ? History of kidney stones   ? METABOLIC STONE DISEASE  ? History of urinary retention   ? POST OP SURGERY 08/ 2013  ? Hypercalciuria   ? Hyperlipidemia   ? takes Crestor daily  ? Hypertension   ? LBBB (left bundle branch block)   ? Muscle cramps   ? legs occasonally in am  ? Renal calculus, right   ? Renal cyst, right   ? 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 LIMA to LAD, Sequential SVG to OM1-OM2, EVH via right thigh  ? Seasonal allergies   ? ? ?Past Surgical History:  ?Procedure Laterality Date  ? CARDIAC CATHETERIZATION N/A 10/28/2015  ? Procedure: Left Heart Cath and Coronary Angiography;  Surgeon: Belva Crome, MD;  Location: [facility];  Service: Cardiovascular;  Laterality: N/A;  ? CARDIAC CATHETERIZATION  05-21-1995  dr Daneen Schick  ? non-obstructive cad;  normal lvf  ? CARDIOVASCULAR STRESS TEST  09/23/2015  dr Tressia Miners turner  ? Normal nuclear study w/  no prior infarct and no ischemia/  normal LV function and wall motion , nuclear stress ef 54%  ? COLONOSCOPY  last one 2009  ? CORONARY ARTERY BYPASS GRAFT N/A 11/04/2015  ? Procedure: CORONARY ARTERY BYPASS GRAFTING (CABG) times three using left internal mammary artery and right saphenous vein harvested with endoscope.;  Surgeon: Rexene Alberts, MD;  Location: [facility];  Service: Open Heart Surgery;  Laterality: N/A;  ? CYSTOSCOPY WITH RETROGRADE PYELOGRAM, URETEROSCOPY AND STENT PLACEMENT Left 03/09/2017  ? Procedure: CYSTOSCOPY WITH RETROGRADE PYELOGRAM, URETEROSCOPY AND STENT PLACEMENT;  Surgeon: Alexis Frock, MD;  Location: [facility];  Service: Urology;  Laterality: Left;  ? CYSTOSCOPY WITH URETEROSCOPY  07/28/2012  ? Procedure: CYSTOSCOPY WITH URETEROSCOPY;  Surgeon: Dutch Gray, MD;  Location: [facility];  Service: Urology;  Laterality: Left;    ?  ? CYSTOSCOPY/URETEROSCOPY/HOLMIUM LASER/STENT PLACEMENT Right 03/25/2017  ? Procedure: CYSTOSCOPY RIGHT URETEROSCOPYVHOLMIUM LASER STENT PLACEMENT;  Surgeon: Lucas Mallow, MD;  Location: [facility];  Service: Urology;  Laterality: Right;  ? ESOPHAGOGASTRODUODENOSCOPY (EGD) WITH ESOPHAGEAL DILATION  05/ 2018 approx.  ? EXTRACORPOREAL SHOCK WAVE LITHOTRIPSY  multiple last one 05-29-2012  ? FOOT SURGERY  1990s  ? right-removed bone spur   ? HOLMIUM LASER APPLICATION  [id number]  ? Procedure: HOLMIUM LASER APPLICATION;  Surgeon: Dutch Gray, MD;  Location: [facility];  Service: Urology;  Laterality: Left;  left lithotripsy  ? HOLMIUM LASER APPLICATION Left [id number]  ? Procedure: HOLMIUM LASER APPLICATION;  Surgeon: Alexis Frock, MD;  Location: [facility];  Service: Urology;  Laterality: Left;  ? LAPAROSCOPIC APPENDECTOMY N/A 12/20/2018  ? Procedure: APPENDECTOMY LAPAROSCOPIC;  Surgeon: Ralene Ok, MD;  Location: [facility];  Service: General;  Laterality: N/A;  ? POSTERIOR LUMBAR FUSION  03/07/2012  ? w/ laminecotmy L4-5  ? PYELOLITHOTOMY Right 01/1982  ? TEE WITHOUT CARDIOVERSION N/A 11/04/2015  ? Procedure: TRANSESOPHAGEAL ECHOCARDIOGRAM (TEE);  Surgeon: Rexene Alberts, MD;  Location: [facility];  Service: Open Heart Surgery;  Laterality: N/A;  ? TONSILLECTOMY  age 38  ? URETEROLITHOTOMY Right 03/1994  ? ? ?Current Medications: ?Current Meds  ?Medication Sig  ? allopurinol (ZYLOPRIM) 100 MG tablet Take 100 mg by mouth 2 (two) times daily.   ? aspirin EC 81 MG EC tablet Take 1 tablet (81 mg total) by mouth daily.  ? cholecalciferol (VITAMIN D) 1000 UNITS tablet Take 1,000 Units by mouth every morning.   ? Coenzyme Q10 (COQ10) 100 MG CAPS Take 1 capsule by mouth every evening.   ? Evolocumab (REPATHA SURECLICK) [id number] MG/ML SOAJ Inject 1 pen into the skin every 14 (fourteen) days.  ? ezetimibe (ZETIA) 10 MG tablet TAKE 1 TABLET(10 MG) BY MOUTH DAILY  ? fexofenadine (ALLEGRA) 180 MG tablet Take 180 mg by mouth  daily as needed. (allergies)  ? icosapent Ethyl (VASCEPA) 1 g capsule TAKE 2 CAPSULES(2 GRAMS) BY MOUTH TWICE DAILY  ? metoprolol tartrate (LOPRESSOR) 25 MG tablet TAKE 1 TABLET(25 MG) BY MOUTH TWICE DAILY  ? Multiple Vitamin (MULTIVITAMIN WITH MINERALS) TABS Take 1 tablet by mouth every morning.   ? naproxen sodium (ALEVE) 220 MG tablet Take 220 mg by mouth daily as needed (pain).  ? pantoprazole (PROTONIX) 40 MG tablet Take 1 tablet (40 mg total) by mouth 2 (two) times daily.  ? pravastatin (PRAVACHOL) 80 MG tablet Take 1 tablet (80 mg total) by mouth every evening.  ? telmisartan (MICARDIS) 20 MG tablet Take 1 tablet (20 mg total) by mouth every morning.  ? [  DISCONTINUED] amLODipine (NORVASC) 5 MG tablet Take 1 tablet (5 mg total) by mouth daily.  ?  ? ?Allergies:   Atorvastatin, Hydrochlorothiazide, and Rosuvastatin  ? ?Social History  ? ?Socioeconomic History  ? Marital status: Married  ?  Spouse name: Not on file  ? Number of children: Not on file  ? Years of education: Not on file  ? Highest education level: Not on file  ?Occupational History  ? Not on file  ?Tobacco Use  ? Smoking status: Never  ? Smokeless tobacco: Never  ?Vaping Use  ? Vaping Use: Never used  ?Substance and Sexual Activity  ? Alcohol use: No  ?  Alcohol/week: 0.0 standard drinks  ? Drug use: No  ? Sexual activity: Yes  ?  Comment: vasectomy 1980s  ?Other Topics Concern  ? Not on file  ?Social History Narrative  ? Not on file  ? ?Social Determinants of Health  ? ?Financial Resource Strain: Not on file  ?Food Insecurity: Not on file  ?Transportation Needs: Not on file  ?Physical Activity: Not on file  ?Stress: Not on file  ?Social Connections: Not on file  ?  ? ?Family History:  The patient's  family history includes Dementia in his mother; Prostate cancer in his father.  ? ?ROS:   ?Please see the history of present illness.    ?ROS All other systems reviewed and are negative. ? ? ?PHYSICAL EXAM:   ?VS:  BP 126/78   Pulse 95   Ht 5\' 6"   (1.676 m)   Wt 221 lb (100.2 kg)   SpO2 98%   BMI 35.67 kg/m?   ?Physical Exam  ?GEN: Obese, in no acute distress  ?Neck: no JVD, carotid bruits, or masses ?Cardiac:RRR; no murmurs, rubs, or gallops  ?Re

## 2021-10-06 ENCOUNTER — Ambulatory Visit: Payer: Medicare Other | Admitting: Cardiology

## 2021-10-10 ENCOUNTER — Encounter: Payer: Self-pay | Admitting: Physician Assistant

## 2021-10-10 ENCOUNTER — Ambulatory Visit: Payer: Medicare Other | Admitting: Physician Assistant

## 2021-10-10 ENCOUNTER — Other Ambulatory Visit: Payer: Self-pay

## 2021-10-10 VITALS — BP 126/78 | HR 95 | Ht 66.0 in | Wt 221.0 lb

## 2021-10-10 DIAGNOSIS — I25810 Atherosclerosis of coronary artery bypass graft(s) without angina pectoris: Secondary | ICD-10-CM | POA: Diagnosis not present

## 2021-10-10 DIAGNOSIS — I10 Essential (primary) hypertension: Secondary | ICD-10-CM

## 2021-10-10 DIAGNOSIS — E785 Hyperlipidemia, unspecified: Secondary | ICD-10-CM

## 2021-10-10 DIAGNOSIS — Z6835 Body mass index (BMI) 35.0-35.9, adult: Secondary | ICD-10-CM

## 2021-10-10 MED ORDER — AMLODIPINE BESYLATE 5 MG PO TABS: 7.5000 mg | ORAL_TABLET | Freq: Every day | ORAL | 3 refills | Status: DC

## 2021-10-10 NOTE — Patient Instructions (Signed)
Medication Instructions:  ?Your physician has recommended you make the following change in your medication:  ? ?INCREASE: Amlodipine to 7.5mg  daily ? ?*If you need a refill on your cardiac medications before your next appointment, please call your pharmacy* ? ? ?Lab Work: ?None ?If you have labs (blood work) drawn today and your tests are completely normal, you will receive your results only by: ?MyChart Message (if you have MyChart) OR ?A paper copy in the mail ?If you have any lab test that is abnormal or we need to change your treatment, we will call you to review the results. ? ? ?Follow-Up: ?At [facility], you and your health needs are our priority.  As part of our continuing mission to provide you with exceptional heart care, we have created designated Provider Care Teams.  These Care Teams include your primary Cardiologist (physician) and Advanced Practice Providers (APPs -  Physician Assistants and Nurse Practitioners) who all work together to provide you with the care you need, when you need it. ? ?Your next appointment:   ?4-6 week(s) ? ?The format for your next appointment:   ?In Person ? ?Provider:   ?Armanda Magicraci Turner, MD or Jacolyn ReedyMichele Lenze, PA ? ? ?Other Instructions ? ?Two Gram Sodium Diet 2000 mg ? ?What is Sodium? Sodium is a mineral found naturally in many foods. The most significant source of sodium in the diet is table salt, which is about 40% sodium.  Processed, convenience, and preserved foods also contain a large amount of sodium.  The body needs only 500 mg of sodium daily to function,  A normal diet provides more than enough sodium even if you do not use salt. ? ?Why Limit Sodium? A build up of sodium in the body can cause thirst, increased blood pressure, shortness of breath, and water retention.  Decreasing sodium in the diet can reduce edema and risk of heart attack or stroke associated with high blood pressure.  Keep in mind that there are many other factors involved in these health  problems.  Heredity, obesity, lack of exercise, cigarette smoking, stress and what you eat all play a role. ? ?General Guidelines: ?Do not add salt at the table or in cooking.  One teaspoon of salt contains over 2 grams of sodium. ?Read food labels ?Avoid processed and convenience foods ?Ask your dietitian before eating any foods not dicussed in the menu planning guidelines ?Consult your physician if you wish to use a salt substitute or a sodium containing medication such as antacids.  Limit milk and milk products to 16 oz (2 cups) per day. ? ?Shopping Hints: ?READ LABELS!! "Dietetic" does not necessarily mean low sodium. ?Salt and other sodium ingredients are often added to foods during processing. ? ? ? ?Menu Planning Guidelines ?Food Group Choose More Often Avoid  ?Beverages (see also the milk group All fruit juices, low-sodium, salt-free vegetables juices, low-sodium carbonated beverages Regular vegetable or tomato juices, commercially softened water used for drinking or cooking  ?Breads and Cereals Enriched white, wheat, rye and pumpernickel bread, hard rolls and dinner rolls; muffins, cornbread and waffles; most dry cereals, cooked cereal without added salt; unsalted crackers and breadsticks; low sodium or homemade bread crumbs Bread, rolls and crackers with salted tops; quick breads; instant hot cereals; pancakes; commercial bread stuffing; self-rising flower and biscuit mixes; regular bread crumbs or cracker crumbs  ?Desserts and Sweets Desserts and sweets mad with mild should be within allowance Instant pudding mixes and cake mixes  ?Fats Butter or margarine; vegetable  oils; unsalted salad dressings, regular salad dressings limited to 1 Tbs; light, sour and heavy cream Regular salad dressings containing bacon fat, bacon bits, and salt pork; snack dips made with instant soup mixes or processed cheese; salted nuts  ?Fruits Most fresh, frozen and canned fruits Fruits processed with salt or sodium-containing  ingredient (some dried fruits are processed with sodium sulfites  ? ? ? ? ? ? ?Vegetables Fresh, frozen vegetables and low- sodium canned vegetables Regular canned vegetables, sauerkraut, pickled vegetables, and others prepared in brine; frozen vegetables in sauces; vegetables seasoned with ham, bacon or salt pork  ?Condiments, Sauces, Miscellaneous ? Salt substitute with physician's approval; pepper, herbs, spices; vinegar, lemon or lime juice; hot pepper sauce; garlic powder, onion powder, low sodium soy sauce (1 Tbs.); low sodium condiments (ketchup, chili sauce, mustard) in limited amounts (1 tsp.) fresh ground horseradish; unsalted tortilla chips, pretzels, potato chips, popcorn, salsa (1/4 cup) Any seasoning made with salt including garlic salt, celery salt, onion salt, and seasoned salt; sea salt, rock salt, kosher salt; meat tenderizers; monosodium glutamate; mustard, regular soy sauce, barbecue, sauce, chili sauce, teriyaki sauce, steak sauce, Worcestershire sauce, and most flavored vinegars; canned gravy and mixes; regular condiments; salted snack foods, olives, picles, relish, horseradish sauce, catsup  ? ?Food preparation: Try these seasonings ?Meats:    ?Pork Sage, onion Serve with applesauce  ?Chicken Poultry seasoning, thyme, parsley Serve with cranberry sauce  ?Lamb Curry powder, rosemary, garlic, thyme Serve with mint sauce or jelly  ?Veal Marjoram, basil Serve with current jelly, cranberry sauce  ?Beef Pepper, bay leaf Serve with dry mustard, unsalted chive butter  ?[place] leaf, dill Serve with unsalted lemon butter, unsalted parsley butter  ?Vegetables:    ?Asparagus Lemon juice   ?Broccoli Lemon juice   ?Carrots Mustard dressing parsley, mint, nutmeg, glazed with unsalted butter and sugar   ?Green beans Marjoram, lemon juice, nutmeg,dill seed   ?Tomatoes Basil, marjoram, onion   ?Spice /blend for "Salt Shaker" 4 tsp ground thyme ?1 tsp ground sage 3 tsp ground rosemary ?4 tsp ground marjoram   ? ?Test your knowledge ?A product that says "Salt Free" may still contain sodium. True or False ?Garlic Powder and Hot Pepper Sauce an be used as alternative seasonings.True or False ?Processed foods have more sodium than fresh foods.  True or False ?Canned Vegetables have less sodium than froze True or False ? ? ?WAYS TO DECREASE YOUR SODIUM INTAKE ?Avoid the use of added salt in cooking and at the table.  Table salt (and other prepared seasonings which contain salt) is probably one of the greatest sources of sodium in the diet.  Unsalted foods can gain flavor from the sweet, sour, and butter taste sensations of herbs and spices.  Instead of using salt for seasoning, try the following seasonings with the foods listed.  Remember: how you use them to enhance natural food flavors is limited only by your creativity... ?Allspice-Meat, fish, eggs, fruit, peas, red and yellow vegetables ?Almond Extract-Fruit baked goods ?Anise Seed-Sweet breads, fruit, carrots, beets, cottage cheese, cookies (tastes like licorice) ?Basil-Meat, fish, eggs, vegetables, rice, vegetables salads, soups, sauces ?Bay Leaf-Meat, fish, stews, poultry ?Burnet-Salad, vegetables (cucumber-like flavor) ?Caraway Seed-Bread, cookies, cottage cheese, meat, vegetables, cheese, rice ?Cardamon-Baked goods, fruit, soups ?Celery Powder or seed-Salads, salad dressings, sauces, meatloaf, soup, bread.Do not use  celery salt ?Chervil-Meats, salads, fish, eggs, vegetables, cottage cheese (parsley-like flavor) ?Chili Power-Meatloaf, chicken cheese, corn, eggplant, egg dishes ?Chives-Salads cottage cheese, egg dishes, soups, vegetables, sauces ?Cilantro-Salsa,  casseroles ?Cinnamon-Baked goods, fruit, pork, lamb, chicken, carrots ?Cloves-Fruit, baked goods, fish, pot roast, green beans, beets, carrots ?Coriander-Pastry, cookies, meat, salads, cheese (lemon-orange flavor) ?Cumin-Meatloaf, fish,cheese, eggs, cabbage,fruit pie (caraway flavor) ?[employer],  fruit, eggs, fish, poultry, cottage cheese, vegetables ?Dill Seed-Meat, cottage cheese, poultry, vegetables, fish, salads, bread ?Fennel Seed-Bread, cookies, apples, pork, eggs, fish, beets, cabbage, cheese, Licorice

## 2021-10-17 ENCOUNTER — Ambulatory Visit: Payer: Medicare Other | Admitting: Cardiology

## 2021-10-17 ENCOUNTER — Other Ambulatory Visit: Payer: Self-pay | Admitting: Neurological Surgery

## 2021-10-18 ENCOUNTER — Telehealth: Payer: Self-pay | Admitting: *Deleted

## 2021-10-18 NOTE — Telephone Encounter (Signed)
? ?  Pre-operative Risk Assessment  ?  ?Patient Name: [name] [name]  ?DOB: 05-Sep-1945 ?MRN: [id number]  ? ?  ? ?Request for Surgical Clearance   ? ?Procedure:   L3-L4 LUMBAR FUSION ? ?Date of Surgery:  Clearance 11/03/21                              ?   ?Surgeon:  Tia Alert, MD ?Surgeon's Group or Practice Name:  [facility] ?Phone number:  [phone] ?Fax number:  [phone] ?  ?Type of Clearance Requested:   ?- Medical  ?- Pharmacy:  Hold Aspirin NOT INDICATED ?  ?Type of Anesthesia:  General  ?  ?Additional requests/questions:   ? ?Signed, ?Elliot Cousin   ?10/18/2021, 7:05 AM  ? ?

## 2021-10-18 NOTE — Telephone Encounter (Signed)
Pt agreeable to plan of care for tele pre op appt 10/19/21 @ 2 pm. Med rec and consent have been done. If pt has been cleared with the tele appt; the 11/22/21 can be cancelled.  ?

## 2021-10-18 NOTE — Telephone Encounter (Signed)
? ?  Patient Name: [name] [name]  ?DOB: October 27, 1945 ?MRN: [id number] ? ?Primary Cardiologist: Armanda Magic, MD ? ?Chart reviewed as part of pre-operative protocol coverage.  ? ?76 year old male with a history of CAD s/p CABG x3 in 2017, hypertension, hyperlipidemia, and obesity. He was last seen in the office by Wynell Balloon, PA on 10/10/2021 and reported recent chest pain resolved spontaneously and improved with initiation of Protonix. He denied exertional symptoms. Ischemic work-up was not advised at the time. ? ?Received cardiac clearance request for upcoming L3-L4 lumbar fusion scheduled for 11/03/2021 with Dr. Tia Alert with [facility]. ? ?Dr. Mayford Knife, if patient has had no recurrence of symptoms, please advise on proceeding with surgery. Additionally, please advise on holding aspirin prior to procedure. ? ?Please route your response to CV DIV PREOP. ? ?Thank you.  ? ?Joylene Grapes, NP ?10/18/2021, 12:20 PM ? ? ?

## 2021-10-18 NOTE — Telephone Encounter (Signed)
Pt agreeable to plan of care for tele pre op appt 10/19/21 @ 2 pm. Med rec and consent have been done.  ? ?  ?Patient Consent for Virtual Visit  ? ? ?   ? ?[name] [name] has provided verbal consent on 10/18/2021 for a virtual visit (video or telephone). ? ? ?CONSENT FOR VIRTUAL VISIT FOR:  [name] [name]  ?By participating in this virtual visit I agree to the following: ? ?I hereby voluntarily request, consent and authorize [facility] and its employed or contracted physicians, physician assistants, nurse practitioners or other licensed health care professionals (the Practitioner), to provide me with telemedicine health care services (the ?Services") as deemed necessary by the treating Practitioner. I acknowledge and consent to receive the Services by the Practitioner via telemedicine. I understand that the telemedicine visit will involve communicating with the Practitioner through live audiovisual communication technology and the disclosure of certain medical information by electronic transmission. I acknowledge that I have been given the opportunity to request an in-person assessment or other available alternative prior to the telemedicine visit and am voluntarily participating in the telemedicine visit. ? ?I understand that I have the right to withhold or withdraw my consent to the use of telemedicine in the course of my care at any time, without affecting my right to future care or treatment, and that the Practitioner or I may terminate the telemedicine visit at any time. I understand that I have the right to inspect all information obtained and/or recorded in the course of the telemedicine visit and may receive copies of available information for a reasonable fee.  I understand that some of the potential risks of receiving the Services via telemedicine include:  ?Delay or interruption in medical evaluation due to technological equipment failure or disruption; ?Information transmitted may not be sufficient (e.g.  poor resolution of images) to allow for appropriate medical decision making by the Practitioner; and/or  ?In rare instances, security protocols could fail, causing a breach of personal health information. ? ?Furthermore, I acknowledge that it is my responsibility to provide information about my medical history, conditions and care that is complete and accurate to the best of my ability. I acknowledge that Practitioner's advice, recommendations, and/or decision may be based on factors not within their control, such as incomplete or inaccurate data provided by me or distortions of diagnostic images or specimens that may result from electronic transmissions. I understand that the practice of medicine is not an exact science and that Practitioner makes no warranties or guarantees regarding treatment outcomes. I acknowledge that a copy of this consent can be made available to me via my patient portal ([facility]), or I can request a printed copy by calling the office of [facility].   ? ?I understand that my insurance will be billed for this visit.  ? ?I have read or had this consent read to me. ?I understand the contents of this consent, which adequately explains the benefits and risks of the Services being provided via telemedicine.  ?I have been provided ample opportunity to ask questions regarding this consent and the Services and have had my questions answered to my satisfaction. ?I give my informed consent for the services to be provided through the use of telemedicine in my medical care ? ? ? ?

## 2021-10-18 NOTE — Telephone Encounter (Signed)
? ?  Patient Name: [name] [name]  ?DOB: [birth year] ?MRN: [id number] ? ?Primary Cardiologist: Armanda Magic, MD ? ?Chart reviewed as part of pre-operative protocol coverage.  ? ?Per Dr. Mayford Knife, patient may hold aspirin for 5-7 days prior to procedure and may be cleared for surgery if he is able to complete 4.5 METS without any concerning symptoms. ? ?Preoperative team, please contact this patient and set up a phone call appointment for further cardiac evaluation. Thank you for your help. ? ?Joylene Grapes, NP ?10/18/2021, 3:29 PM ? ?

## 2021-10-19 ENCOUNTER — Ambulatory Visit (INDEPENDENT_AMBULATORY_CARE_PROVIDER_SITE_OTHER): Payer: Medicare Other | Admitting: Nurse Practitioner

## 2021-10-19 DIAGNOSIS — Z01810 Encounter for preprocedural cardiovascular examination: Secondary | ICD-10-CM | POA: Diagnosis not present

## 2021-10-19 NOTE — Progress Notes (Signed)
? ?Virtual Visit via Telephone Note  ? ?This visit type was conducted due to national recommendations for restrictions regarding the COVID-19 Pandemic (e.g. social distancing) in an effort to limit this patient's exposure and mitigate transmission in our community.  Due to his co-morbid illnesses, this patient is at least at moderate risk for complications without adequate follow up.  This format is felt to be most appropriate for this patient at this time.  The patient did not have access to video technology/had technical difficulties with video requiring transitioning to audio format only (telephone).  All issues noted in this document were discussed and addressed.  No physical exam could be performed with this format.  Please refer to the patient's chart for his  consent to telehealth for [facility]. ? ?Evaluation Performed:  Preoperative cardiovascular risk assessment ?_____________  ? ?Date:  10/19/2021  ? ?Patient ID:  [name] [name], [name] [name] April 14, 1946, MRN [id number] ?Patient Location:  ?Home ?Provider location:   ?Office ? ?Primary Care Provider:  Josetta Huddle, MD ?Primary Cardiologist:  [name] Him, MD ? ?Chief Complaint  ?  ?76 y.o. y/o male with a h/o CAD s/p CABG x3 in 2017, hypertension, hyperlipidemia and obesity, who is pending upcoming L3-L4 lumbar fusion, and presents today for telephonic preoperative cardiovascular risk assessment. ? ?Past Medical History  ?  ?Past Medical History:  ?Diagnosis Date  ? BPH associated with nocturia   ? CAD (coronary artery disease), native coronary artery   ? CARDIOLOGIST-  DR Tressia Miners TURNER--  normal myoview 03/ 2017 but Cardiac CT 04/ 2017 calcium score 1951, >70% LAD stenosis possible disease RCA , LCX--- cath done showed severe 3V disease S/P  CABG 04/ 2017  ? Chronic back pain   ? herniated disc  ? CKD (chronic kidney disease), stage III ([place])   ? Complication of anesthesia   ? POST-OP URINARY RETENTION 08/ 2013  ? Diverticulosis   ? Gout   ? as of 03-20-2017 per  pt stable  ? History of kidney stones   ? METABOLIC STONE DISEASE  ? History of urinary retention   ? POST OP SURGERY 08/ 2013  ? Hypercalciuria   ? Hyperlipidemia   ? takes Crestor daily  ? Hypertension   ? LBBB (left bundle branch block)   ? Muscle cramps   ? legs occasonally in am  ? Renal calculus, right   ? Renal cyst, right   ? 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 LIMA to LAD, Sequential SVG to OM1-OM2, EVH via right thigh  ? Seasonal allergies   ? ?Past Surgical History:  ?Procedure Laterality Date  ? CARDIAC CATHETERIZATION N/A 10/28/2015  ? Procedure: Left Heart Cath and Coronary Angiography;  Surgeon: Belva Crome, MD;  Location: [facility];  Service: Cardiovascular;  Laterality: N/A;  ? CARDIAC CATHETERIZATION  05-21-1995  dr Daneen Schick  ? non-obstructive cad;  normal lvf  ? CARDIOVASCULAR STRESS TEST  09/23/2015  dr Tressia Miners turner  ? Normal nuclear study w/ no prior infarct and no ischemia/  normal LV function and wall motion , nuclear stress ef 54%  ? COLONOSCOPY  last one 2009  ? CORONARY ARTERY BYPASS GRAFT N/A 11/04/2015  ? Procedure: CORONARY ARTERY BYPASS GRAFTING (CABG) times three using left internal mammary artery and right saphenous vein harvested with endoscope.;  Surgeon: Rexene Alberts, MD;  Location: [facility];  Service: Open Heart Surgery;  Laterality: N/A;  ? CYSTOSCOPY WITH RETROGRADE PYELOGRAM, URETEROSCOPY AND STENT PLACEMENT Left 03/09/2017  ?  Procedure: CYSTOSCOPY WITH RETROGRADE PYELOGRAM, URETEROSCOPY AND STENT PLACEMENT;  Surgeon: Alexis Frock, MD;  Location: [facility];  Service: Urology;  Laterality: Left;  ? CYSTOSCOPY WITH URETEROSCOPY  07/28/2012  ? Procedure: CYSTOSCOPY WITH URETEROSCOPY;  Surgeon: Dutch Gray, MD;  Location: [facility];  Service: Urology;  Laterality: Left;   ?  ? CYSTOSCOPY/URETEROSCOPY/HOLMIUM LASER/STENT PLACEMENT Right 03/25/2017  ? Procedure: CYSTOSCOPY RIGHT URETEROSCOPYVHOLMIUM LASER STENT PLACEMENT;  Surgeon: Lucas Mallow, MD;  Location: [facility];  Service: Urology;  Laterality: Right;  ? ESOPHAGOGASTRODUODENOSCOPY (EGD) WITH ESOPHAGEAL DILATION  05/ 2018 approx.  ? EXTRACORPOREAL SHOCK WAVE LITHOTRIPSY  multiple last one 05-29-2012  ? FOOT SURGERY  1990s  ? right-removed bone spur   ? HOLMIUM LASER APPLICATION  [id number]  ? Procedure: HOLMIUM LASER APPLICATION;  Surgeon: Dutch Gray, MD;  Location: [facility];  Service: Urology;  Laterality: Left;  left lithotripsy  ? HOLMIUM LASER APPLICATION Left [id number]  ? Procedure: HOLMIUM LASER APPLICATION;  Surgeon: Alexis Frock, MD;  Location: [facility];  Service: Urology;  Laterality: Left;  ? LAPAROSCOPIC APPENDECTOMY N/A 12/20/2018  ? Procedure: APPENDECTOMY LAPAROSCOPIC;  Surgeon: Ralene Ok, MD;  Location: [facility];  Service: General;  Laterality: N/A;  ? POSTERIOR LUMBAR FUSION  03/07/2012  ? w/ laminecotmy L4-5  ? PYELOLITHOTOMY Right 01/1982  ? TEE WITHOUT CARDIOVERSION N/A 11/04/2015  ? Procedure: TRANSESOPHAGEAL ECHOCARDIOGRAM (TEE);  Surgeon: Rexene Alberts, MD;  Location: [facility];  Service: Open Heart Surgery;  Laterality: N/A;  ? TONSILLECTOMY  age 62  ? URETEROLITHOTOMY Right 03/1994  ? ? ?Allergies ? ?Allergies  ?Allergen Reactions  ? Atorvastatin Other (See Comments)  ?  Reaction: dizziness/ dyspepsia. Muscle cramps all over body. ?Other reaction(s): dizziness and dyspepsia  ? Hydrochlorothiazide Other (See Comments)  ?  Reaction: arthralgias/ skin burning ?Other reaction(s): arthralgias/skin burning  ? Rosuvastatin Other (See Comments)  ?  Reaction: muscle cramping ?Other reaction(s): hand cramping  ? ? ?History of Present Illness  ?  ?[name] [name] is a 76 y.o. male who presents via audio/video conferencing for a telehealth visit today. Pt was last seen in cardiology clinic on 10/10/2021 by [name] Husk, PA-C.  At that time [name] [name] reported some recent chest pain that resolved spontaneously, and resolved following initiation of Protonix. BP was mildly elevated, sodium restricted diet was  recommended and amlodipine was increased to 7.5 mg daily.  The patient is now pending L3-L4 lumbar fusion scheduled for 11/03/2021 with Dr. Eustace Moore with [facility]. Since his last visit, he has done well from a cardiac standpoint. He has had no further chest pain, denies symptoms concerning for angina.  His BP is well controlled.  ? ?He denies chest pain, palpitations, dyspnea, pnd, orthopnea, n, v, dizziness, syncope, edema, weight gain, or early satiety. All other systems reviewed and are otherwise negative except as noted above.  ? ?Home Medications  ?  ?Prior to Admission medications   ?Medication Sig Start Date End Date Taking? Authorizing Provider  ?allopurinol (ZYLOPRIM) 100 MG tablet Take 100 mg by mouth 2 (two) times daily.     [provider]  ?amLODipine (NORVASC) 5 MG tablet Take 1.5 tablets (7.5 mg total) by mouth daily. 10/10/21   Imogene Burn, PA-C  ?aspirin EC 81 MG EC tablet Take 1 tablet (81 mg total) by mouth daily. 01/19/16   Sueanne Margarita, MD  ?cholecalciferol (VITAMIN D) 1000 UNITS tablet Take 1,000 Units by mouth every morning.  [provider]  ?Coenzyme Q10 (COQ10) 100 MG CAPS Take 1 capsule by mouth every evening.     [provider]  ?Evolocumab (REPATHA SURECLICK) [id number] MG/ML SOAJ Inject 1 pen into the skin every 14 (fourteen) days. 05/05/21   Sueanne Margarita, MD  ?ezetimibe (ZETIA) 10 MG tablet TAKE 1 TABLET(10 MG) BY MOUTH DAILY ?Patient not taking: Reported on 10/18/2021 04/20/21   Sueanne Margarita, MD  ?fexofenadine (ALLEGRA) 180 MG tablet Take 180 mg by mouth daily as needed. (allergies) 02/11/15   [provider]  ?icosapent Ethyl (VASCEPA) 1 g capsule TAKE 2 CAPSULES(2 GRAMS) BY MOUTH TWICE DAILY 07/05/21   Sueanne Margarita, MD  ?metoprolol tartrate (LOPRESSOR) 25 MG tablet TAKE 1 TABLET(25 MG) BY MOUTH TWICE DAILY 04/20/21   Sueanne Margarita, MD  ?Multiple Vitamin (MULTIVITAMIN WITH MINERALS) TABS Take 1 tablet by mouth  every morning.     [provider]  ?naproxen sodium (ALEVE) 220 MG tablet Take 220 mg by mouth daily as needed (pain).    [provider]  ?pantoprazole (PROTONIX) 40 MG tablet Take 1

## 2021-10-27 NOTE — Pre-Procedure Instructions (Signed)
Surgical Instructions ? ? ? Your procedure is scheduled on Friday, November 03, 2021 at 7:28 AM. ? Report to [facility] Main Entrance "A" at 5:30 A.M., then check in with the Admitting office. ? Call this number if you have problems the morning of surgery: ? [phone] ? ? If you have any questions prior to your surgery date call [phone]: Open Monday-Friday 8am-4pm ? ? ? Remember: ? Do not eat or drink after midnight the night before your surgery ?  ? Take these medicines the morning of surgery with A SIP OF WATER: ? ?allopurinol (ZYLOPRIM) ?amLODipine (NORVASC) ?ezetimibe (ZETIA) ?fexofenadine (ALLEGRA) ?metoprolol tartrate (LOPRESSOR) ?pantoprazole (PROTONIX) ? ?Follow your surgeon's instructions on when to stop Aspirin.  If no instructions were given by your surgeon then you will need to call the office to get those instructions.   ? ? ?As of today, STOP taking any Aleve, Naproxen, Ibuprofen, Motrin, Advil, Goody's, BC's, all herbal medications, fish oil, and all vitamins. ?         ?           ?Do NOT Smoke (Tobacco/Vaping) for 24 hours prior to your procedure. ? ?If you use a CPAP at night, you may bring your mask/headgear for your overnight stay. ?  ?Contacts, glasses, piercing's, hearing aid's, dentures or partials may not be worn into surgery, please bring cases for these belongings.  ?  ?For patients admitted to the hospital, discharge time will be determined by your treatment team. ?  ?Patients discharged the day of surgery will not be allowed to drive home, and someone needs to stay with them for 24 hours. ? ?SURGICAL WAITING ROOM VISITATION ?Patients having surgery or a procedure may have two support people in the waiting area. ?Visitors may stay in the waiting area during the procedure and switch out with other visitors if needed. ?Children under the age of 63 must have an adult accompany them who is not the patient. ?If the patient needs to stay at the hospital during part of their recovery, the  visitor guidelines for inpatient rooms apply. ? ?Please refer to the [facility] website for the visitor guidelines for Inpatients (after your surgery is over and you are in a regular room).  ? ? ?Special instructions:   ?[facility]- Preparing For Surgery ? ?Before surgery, you can play an important role. Because skin is not sterile, your skin needs to be as free of germs as possible. You can reduce the number of germs on your skin by washing with CHG (chlorahexidine gluconate) Soap before surgery.  CHG is an antiseptic cleaner which kills germs and bonds with the skin to continue killing germs even after washing.   ? ?Oral Hygiene is also important to reduce your risk of infection.  Remember - BRUSH YOUR TEETH THE MORNING OF SURGERY WITH YOUR REGULAR TOOTHPASTE ? ?Please do not use if you have an allergy to CHG or antibacterial soaps. If your skin becomes reddened/irritated stop using the CHG.  ?Do not shave (including legs and underarms) for at least 48 hours prior to first CHG shower. It is OK to shave your face. ? ?Please follow these instructions carefully. ?  ?Shower the NIGHT BEFORE SURGERY and the MORNING OF SURGERY ? ?If you chose to wash your hair, wash your hair first as usual with your normal shampoo. ? ?After you shampoo, rinse your hair and body thoroughly to remove the shampoo. ? ?Use CHG Soap as you would any other liquid soap. You can  apply CHG directly to the skin and wash gently with a scrungie or a clean washcloth.  ? ?Apply the CHG Soap to your body ONLY FROM THE NECK DOWN.  Do not use on open wounds or open sores. Avoid contact with your eyes, ears, mouth and genitals (private parts). Wash Face and genitals (private parts)  with your normal soap.  ? ?Wash thoroughly, paying special attention to the area where your surgery will be performed. ? ?Thoroughly rinse your body with warm water from the neck down. ? ?DO NOT shower/wash with your normal soap after using and rinsing off the CHG  Soap. ? ?Pat yourself dry with a CLEAN TOWEL. ? ?Wear CLEAN PAJAMAS to bed the night before surgery ? ?Place CLEAN SHEETS on your bed the night before your surgery ? ?DO NOT SLEEP WITH PETS. ? ? ?Day of Surgery: ?Take a shower with CHG soap. ?Do not wear jewelry. ?Do not wear lotions, powders, colognes, or deodorant. ?Do not shave 48 hours prior to surgery.  Men may shave face and neck. ?Do not bring valuables to the hospital.  ?[facility] is not responsible for any belongings or valuables. ?Wear Clean/Comfortable clothing the morning of surgery ?Do not apply any deodorants/lotions.   ?Remember to brush your teeth WITH YOUR REGULAR TOOTHPASTE. ?  ?Please read over the following fact sheets that you were given. ? ?If you received a COVID test during your pre-op visit  it is requested that you wear a mask when out in public, stay away from anyone that may not be feeling well and notify your surgeon if you develop symptoms. If you have been in contact with anyone that has tested positive in the last 10 days please notify you surgeon. ?  ? ?

## 2021-10-30 ENCOUNTER — Encounter (HOSPITAL_COMMUNITY): Payer: Self-pay

## 2021-10-30 ENCOUNTER — Encounter (HOSPITAL_COMMUNITY)
Admission: RE | Admit: 2021-10-30 | Discharge: 2021-10-30 | Disposition: A | Discharge status: Home / Self Care | Payer: Medicare Other | Source: Ambulatory Visit | Attending: Neurological Surgery | Admitting: Neurological Surgery

## 2021-10-30 ENCOUNTER — Other Ambulatory Visit: Payer: Self-pay

## 2021-10-30 VITALS — BP 143/84 | HR 106 | Temp 98.0°F | Resp 17 | Ht 66.0 in | Wt 220.7 lb

## 2021-10-30 DIAGNOSIS — I10 Essential (primary) hypertension: Secondary | ICD-10-CM | POA: Insufficient documentation

## 2021-10-30 DIAGNOSIS — E785 Hyperlipidemia, unspecified: Secondary | ICD-10-CM | POA: Insufficient documentation

## 2021-10-30 DIAGNOSIS — Z01812 Encounter for preprocedural laboratory examination: Secondary | ICD-10-CM | POA: Insufficient documentation

## 2021-10-30 DIAGNOSIS — I2510 Atherosclerotic heart disease of native coronary artery without angina pectoris: Secondary | ICD-10-CM | POA: Diagnosis not present

## 2021-10-30 DIAGNOSIS — Z951 Presence of aortocoronary bypass graft: Secondary | ICD-10-CM | POA: Diagnosis not present

## 2021-10-30 DIAGNOSIS — E119 Type 2 diabetes mellitus without complications: Secondary | ICD-10-CM | POA: Diagnosis not present

## 2021-10-30 DIAGNOSIS — I447 Left bundle-branch block, unspecified: Secondary | ICD-10-CM | POA: Insufficient documentation

## 2021-10-30 DIAGNOSIS — Z01818 Encounter for other preprocedural examination: Secondary | ICD-10-CM

## 2021-10-30 LAB — BASIC METABOLIC PANEL
Anion gap: 9 (ref 5–15)
BUN: 15 mg/dL (ref 8–23)
CO2: 25 mmol/L (ref 22–32)
Calcium: 9.8 mg/dL (ref 8.9–10.3)
Chloride: 108 mmol/L (ref 98–111)
Creatinine, Ser: 1.69 mg/dL — ABNORMAL HIGH (ref 0.61–1.24)
GFR, Estimated: 42 mL/min — ABNORMAL LOW (ref >60)
Glucose, Bld: 125 mg/dL — ABNORMAL HIGH (ref 70–99)
Potassium: 3.9 mmol/L (ref 3.5–5.1)
Sodium: 142 mmol/L (ref 135–145)

## 2021-10-30 LAB — CBC
HCT: 45.3 % (ref 39.0–52.0)
Hemoglobin: 15.1 g/dL (ref 13.0–17.0)
MCH: 31 pg (ref 26.0–34.0)
MCHC: 33.3 g/dL (ref 30.0–36.0)
MCV: 93 fL (ref 80.0–100.0)
Platelets: 172 10*3/uL (ref 150–400)
RBC: 4.87 MIL/uL (ref 4.22–5.81)
RDW: 13.5 % (ref 11.5–15.5)
WBC: 8.1 10*3/uL (ref 4.0–10.5)
nRBC: 0 % (ref 0.0–0.2)

## 2021-10-30 LAB — SURGICAL PCR SCREEN
MRSA, PCR: NEGATIVE
Staphylococcus aureus: POSITIVE — AB

## 2021-10-30 NOTE — Pre-Procedure Instructions (Signed)
Surgical Instructions ? ? ? Your procedure is scheduled on Friday, November 03, 2021 at 7:28 AM. ? Report to [facility] Main Entrance "A" at 5:30 A.M., then check in with the Admitting office. ? Call this number if you have problems the morning of surgery: ? [phone] ? ? If you have any questions prior to your surgery date call [phone]: Open Monday-Friday 8am-4pm ? ? ? Remember: ? Do not eat after midnight the night before your surgery ? ? You may have clear liquids up until 4:30 AM the morning of surgery.  Clear liquids include: water, non-citric juices (without pulp), clear tea, black coffee, gatorade. ?  ? Take these medicines the morning of surgery with A SIP OF WATER: ? ?allopurinol (ZYLOPRIM) ?amLODipine (NORVASC) ?ezetimibe (ZETIA) ?fexofenadine (ALLEGRA) ?metoprolol tartrate (LOPRESSOR) ?pantoprazole (PROTONIX) ? ?Follow your surgeon's instructions on when to stop Aspirin.  If no instructions were given by your surgeon then you will need to call the office to get those instructions.   ? ? ?As of today, STOP taking any Aleve, Naproxen, Ibuprofen, Motrin, Advil, Goody's, BC's, all herbal medications, fish oil, and all vitamins. ?         ?           ?Do NOT Smoke (Tobacco/Vaping) for 24 hours prior to your procedure. ? ?If you use a CPAP at night, you may bring your mask/headgear for your overnight stay. ?  ?Contacts, glasses, piercing's, hearing aid's, dentures or partials may not be worn into surgery, please bring cases for these belongings.  ?  ?For patients admitted to the hospital, discharge time will be determined by your treatment team. ?  ?Patients discharged the day of surgery will not be allowed to drive home, and someone needs to stay with them for 24 hours. ? ?SURGICAL WAITING ROOM VISITATION ?Patients having surgery or a procedure may have two support people in the waiting area. ?Visitors may stay in the waiting area during the procedure and switch out with other visitors if  needed. ?Children under the age of 42 must have an adult accompany them who is not the patient. ?If the patient needs to stay at the hospital during part of their recovery, the visitor guidelines for inpatient rooms apply. ? ?Please refer to the [facility] website for the visitor guidelines for Inpatients (after your surgery is over and you are in a regular room).  ? ? ?Special instructions:   ?[facility]- Preparing For Surgery ? ?Before surgery, you can play an important role. Because skin is not sterile, your skin needs to be as free of germs as possible. You can reduce the number of germs on your skin by washing with CHG (chlorahexidine gluconate) Soap before surgery.  CHG is an antiseptic cleaner which kills germs and bonds with the skin to continue killing germs even after washing.   ? ?Oral Hygiene is also important to reduce your risk of infection.  Remember - BRUSH YOUR TEETH THE MORNING OF SURGERY WITH YOUR REGULAR TOOTHPASTE ? ?Please do not use if you have an allergy to CHG or antibacterial soaps. If your skin becomes reddened/irritated stop using the CHG.  ?Do not shave (including legs and underarms) for at least 48 hours prior to first CHG shower. It is OK to shave your face. ? ?Please follow these instructions carefully. ?  ?Shower the NIGHT BEFORE SURGERY and the MORNING OF SURGERY ? ?If you chose to wash your hair, wash your hair first as usual with your normal shampoo. ? ?  After you shampoo, rinse your hair and body thoroughly to remove the shampoo. ? ?Use CHG Soap as you would any other liquid soap. You can apply CHG directly to the skin and wash gently with a scrungie or a clean washcloth.  ? ?Apply the CHG Soap to your body ONLY FROM THE NECK DOWN.  Do not use on open wounds or open sores. Avoid contact with your eyes, ears, mouth and genitals (private parts). Wash Face and genitals (private parts)  with your normal soap.  ? ?Wash thoroughly, paying special attention to the area where your  surgery will be performed. ? ?Thoroughly rinse your body with warm water from the neck down. ? ?DO NOT shower/wash with your normal soap after using and rinsing off the CHG Soap. ? ?Pat yourself dry with a CLEAN TOWEL. ? ?Wear CLEAN PAJAMAS to bed the night before surgery ? ?Place CLEAN SHEETS on your bed the night before your surgery ? ?DO NOT SLEEP WITH PETS. ? ? ?Day of Surgery: ?Take a shower with CHG soap. ?Do not wear jewelry. ?Do not wear lotions, powders, colognes, or deodorant. ?Do not shave 48 hours prior to surgery.  Men may shave face and neck. ?Do not bring valuables to the hospital.  ?[facility] is not responsible for any belongings or valuables. ?Wear Clean/Comfortable clothing the morning of surgery ?Do not apply any deodorants/lotions.   ?Remember to brush your teeth WITH YOUR REGULAR TOOTHPASTE. ?  ?Please read over the following fact sheets that you were given. ? ?If you received a COVID test during your pre-op visit  it is requested that you wear a mask when out in public, stay away from anyone that may not be feeling well and notify your surgeon if you develop symptoms. If you have been in contact with anyone that has tested positive in the last 10 days please notify you surgeon. ?  ? ?

## 2021-10-30 NOTE — Progress Notes (Signed)
PCP - Marden Noble ?Cardiologist - Armanda Magic ?Received cardiac clearance on 10/19/21 ? ?Chest x-ray - n/a ?EKG - 10/10/21 ?Stress Test - 2017 ?ECHO - 11/08/15 ?Cardiac Cath - 10/28/15 ? ?Aspirin Instructions: stop 5-7 days before surgery, per cardiologist ? ?ERAS Protcol - yes, no drink ordered or given ? ?Anesthesia review: yes, heart history, received cardiac clearance 10/19/21 ? ?Patient denies shortness of breath, fever, cough and chest pain at PAT appointment ? ? ?All instructions explained to the patient, with a verbal understanding of the material. Patient agrees to go over the instructions while at home for a better understanding. Patient also instructed to self quarantine after being tested for COVID-19. The opportunity to ask questions was provided. ? ? ?

## 2021-10-31 LAB — TYPE AND SCREEN
ABO/RH(D): A NEG
Antibody Screen: NEGATIVE

## 2021-10-31 NOTE — Progress Notes (Signed)
Anesthesia Chart Review: ? ? 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  Patient was noted to have had normal systolic function at time of CABG.  Last seen via televisit 10/19/2021 for preoperative evaluation.  Per note, "According to the Revised Cardiac Risk Index (RCRI), his Perioperative Risk of Major Cardiac Event is (%): 0.9. His Functional Capacity in METs is: 7.99 according to the [facility] Activity Status Index (DASI). Therefore, based on ACC/AHA guidelines, patient would be at acceptable risk for the planned procedure without further cardiovascular testing. I will route this recommendation to the requesting party via Epic fax function. Per Dr. Mayford Knife, he may hold aspirin for 5 to 7 days prior to procedure. Please resume aspirin as soon as possible postprocedure, at the discretion of the surgeon." ? ?Preop labs reviewed, creatinine elevated 1.69 (appears to be near baseline), labs otherwise unremarkable. ? ?EKG 10/10/2021: NSR.  Rate 95.  Left bundle branch block. ? ? ?Antionette Poles, PA-C ?[facility]/Anesthesiology ?Phone [phone] ?10/31/2021 1:49 PM ? ?

## 2021-10-31 NOTE — Anesthesia Preprocedure Evaluation (Addendum)
Anesthesia Evaluation  ?Patient identified by MRN, date of birth, ID band ?Patient awake ? ? ? ?Reviewed: ?Allergy & Precautions, NPO status , Patient's Chart, lab work & pertinent test results ? ?History of Anesthesia Complications ?Negative for: history of anesthetic complications ? ?Airway ?Mallampati: IV ? ?TM Distance: >3 FB ?Neck ROM: Full ? ? ? Dental ? ?(+) Teeth Intact, Dental Advisory Given ?  ?Pulmonary ?neg pulmonary ROS,  ?  ?Pulmonary exam normal ? ? ? ? ? ? ? Cardiovascular ?Exercise Tolerance: Good ?METS: 7 - 9 Mets hypertension, Pt. on medications and Pt. on home beta blockers ?+ CAD and + CABG (2017)  ?Normal cardiovascular exam ? ?EKG: LBBB ?  ?Neuro/Psych ?negative neurological ROS ?   ? GI/Hepatic ?Neg liver ROS, GERD  ,  ?Endo/Other  ?negative endocrine ROS ? 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  ?Musculoskeletal ?negative musculoskeletal ROS ?(+)  ? Abdominal ?  ?Peds ? Hematology ?negative hematology ROS ?(+)   ?Anesthesia Other Findings ? ? Reproductive/Obstetrics ? ?  ? ? ? ? ? ? ? ? ? ? ? ? ? ?  ?  ? ? ? ? ? ? ?Anesthesia Physical ?Anesthesia Plan ? ?ASA: 3 ? ?Anesthesia Plan: General  ? ?Post-op Pain Management: Tylenol PO (pre-op)*, Ketamine IV* and Dilaudid IV  ? ?Induction: Intravenous ? ?PONV Risk Score and Plan: 2 and Ondansetron, Dexamethasone, Treatment may vary due to age or medical condition and Midazolam ? ?Airway Management Planned: Oral ETT ? ?Additional Equipment: None ? ?Intra-op Plan:  ? ?Post-operative Plan: Extubation in OR ? ?Informed Consent: I have reviewed the patients History and Physical, chart, labs and discussed the procedure including the risks, benefits and alternatives for the proposed anesthesia with the patient or authorized representative who has indicated his/her understanding and acceptance.  ? ? ? ?Dental advisory given ? ?Plan Discussed with:  ? ?Anesthesia Plan Comments: (PAT note by  Antionette Poles, PA-C: ?Patient follows with cardiology for history of CAD s/p CABG times 10/03/2015, HTN, HLD, left bundle branch block.  Patient was noted to have had normal systolic function at time of CABG.  Last seen via televisit 10/19/2021 for preoperative evaluation.  Per note, "According to the Revised Cardiac Risk Index (RCRI),?his?Perioperative Risk of Major Cardiac Event is (%): 0.9.?His?Functional Capacity in METs is: 7.99?according to the [facility] Activity Status Index (DASI). Therefore, based on ACC/AHA guidelines, patient would be at acceptable risk for the planned procedure without further cardiovascular testing. I will route this recommendation to the requesting party via Epic fax function. Per Dr. Mayford Knife, he may hold?aspirin for 5 to 7 days prior to procedure.?Please resume aspirin as soon as possible postprocedure, at the discretion of the surgeon." ? ?Preop labs reviewed, creatinine elevated 1.69 (appears to be near baseline), labs otherwise unremarkable. ? ?EKG 10/10/2021: NSR.  Rate 95.  Left bundle branch block. ? ?)  ? ? ? ? ?Anesthesia Quick Evaluation ? ?

## 2021-11-03 ENCOUNTER — Ambulatory Visit (HOSPITAL_COMMUNITY): Payer: Medicare Other

## 2021-11-03 ENCOUNTER — Other Ambulatory Visit: Payer: Self-pay

## 2021-11-03 ENCOUNTER — Encounter (HOSPITAL_COMMUNITY): Payer: Self-pay | Admitting: Neurological Surgery

## 2021-11-03 ENCOUNTER — Observation Stay (HOSPITAL_COMMUNITY)
Admission: RE | Admit: 2021-11-03 | Discharge: 2021-11-03 | Disposition: A | Discharge status: Home / Self Care | Payer: Medicare Other | Attending: Neurological Surgery | Admitting: Neurological Surgery

## 2021-11-03 ENCOUNTER — Ambulatory Visit (HOSPITAL_BASED_OUTPATIENT_CLINIC_OR_DEPARTMENT_OTHER): Payer: Medicare Other | Admitting: Anesthesiology

## 2021-11-03 ENCOUNTER — Ambulatory Visit (HOSPITAL_COMMUNITY): Payer: Medicare Other | Admitting: Physician Assistant

## 2021-11-03 ENCOUNTER — Encounter (HOSPITAL_COMMUNITY)
Admission: RE | Disposition: A | Discharge status: Home / Self Care | Payer: Self-pay | Source: Home / Self Care | Attending: Neurological Surgery

## 2021-11-03 DIAGNOSIS — M48061 Spinal stenosis, lumbar region without neurogenic claudication: Secondary | ICD-10-CM | POA: Insufficient documentation

## 2021-11-03 DIAGNOSIS — N183 Chronic kidney disease, stage 3 unspecified: Secondary | ICD-10-CM | POA: Diagnosis not present

## 2021-11-03 DIAGNOSIS — M48062 Spinal stenosis, lumbar region with neurogenic claudication: Secondary | ICD-10-CM

## 2021-11-03 DIAGNOSIS — Z79899 Other long term (current) drug therapy: Secondary | ICD-10-CM | POA: Insufficient documentation

## 2021-11-03 DIAGNOSIS — Z7982 Long term (current) use of aspirin: Secondary | ICD-10-CM | POA: Diagnosis not present

## 2021-11-03 DIAGNOSIS — M4316 Spondylolisthesis, lumbar region: Principal | ICD-10-CM | POA: Insufficient documentation

## 2021-11-03 DIAGNOSIS — Z981 Arthrodesis status: Secondary | ICD-10-CM

## 2021-11-03 DIAGNOSIS — I129 Hypertensive chronic kidney disease with stage 1 through stage 4 chronic kidney disease, or unspecified chronic kidney disease: Secondary | ICD-10-CM | POA: Diagnosis not present

## 2021-11-03 DIAGNOSIS — Z951 Presence of aortocoronary bypass graft: Secondary | ICD-10-CM | POA: Diagnosis not present

## 2021-11-03 DIAGNOSIS — I2510 Atherosclerotic heart disease of native coronary artery without angina pectoris: Secondary | ICD-10-CM

## 2021-11-03 DIAGNOSIS — M5416 Radiculopathy, lumbar region: Secondary | ICD-10-CM

## 2021-11-03 SURGERY — POSTERIOR LUMBAR FUSION 1 LEVEL
Anesthesia: General | Site: Back

## 2021-11-03 MED ORDER — GELATIN ABSORBABLE 100 EX MISC
CUTANEOUS | Status: DC | PRN
  Administered 2021-11-03: 20 mL via TOPICAL

## 2021-11-03 MED ORDER — HYDROMORPHONE HCL 1 MG/ML IJ SOLN: 0.2500 mg | INTRAMUSCULAR | Status: DC | PRN

## 2021-11-03 MED ORDER — BUPIVACAINE HCL (PF) 0.25 % IJ SOLN
INTRAMUSCULAR | Status: DC | PRN
  Administered 2021-11-03: 10 mL

## 2021-11-03 MED ORDER — CHLORHEXIDINE GLUCONATE 0.12 % MT SOLN
15.0000 mL | Freq: Once | OROMUCOSAL | Status: AC
  Administered 2021-11-03: 15 mL via OROMUCOSAL
  Filled 2021-11-03: qty 15

## 2021-11-03 MED ORDER — OXYCODONE HCL 5 MG PO TABS: 5.0000 mg | ORAL_TABLET | Freq: Once | ORAL | Status: DC | PRN

## 2021-11-03 MED ORDER — FENTANYL CITRATE (PF) 250 MCG/5ML IJ SOLN
INTRAMUSCULAR | Status: AC
  Filled 2021-11-03: qty 5

## 2021-11-03 MED ORDER — PROPOFOL 10 MG/ML IV BOLUS
INTRAVENOUS | Status: AC
  Filled 2021-11-03: qty 20

## 2021-11-03 MED ORDER — ONDANSETRON HCL 4 MG/2ML IJ SOLN
INTRAMUSCULAR | Status: AC
  Filled 2021-11-03: qty 2

## 2021-11-03 MED ORDER — ONDANSETRON HCL 4 MG/2ML IJ SOLN
INTRAMUSCULAR | Status: DC | PRN
  Administered 2021-11-03: 4 mg via INTRAVENOUS

## 2021-11-03 MED ORDER — PROPOFOL 10 MG/ML IV BOLUS
INTRAVENOUS | Status: DC | PRN
  Administered 2021-11-03: 160 mg via INTRAVENOUS

## 2021-11-03 MED ORDER — SENNA 8.6 MG PO TABS
1.0000 | ORAL_TABLET | Freq: Two times a day (BID) | ORAL | Status: DC
  Administered 2021-11-03: 8.6 mg via ORAL
  Filled 2021-11-03: qty 1

## 2021-11-03 MED ORDER — THROMBIN 5000 UNITS EX SOLR
OROMUCOSAL | Status: DC | PRN
  Administered 2021-11-03: 5 mL via TOPICAL

## 2021-11-03 MED ORDER — OXYCODONE HCL 5 MG PO TABS
10.0000 mg | ORAL_TABLET | ORAL | Status: DC | PRN
  Administered 2021-11-03: 10 mg via ORAL
  Filled 2021-11-03: qty 2

## 2021-11-03 MED ORDER — AMISULPRIDE (ANTIEMETIC) 5 MG/2ML IV SOLN: 10.0000 mg | Freq: Once | INTRAVENOUS | Status: DC | PRN

## 2021-11-03 MED ORDER — ROCURONIUM BROMIDE 10 MG/ML (PF) SYRINGE
PREFILLED_SYRINGE | INTRAVENOUS | Status: AC
  Filled 2021-11-03: qty 10

## 2021-11-03 MED ORDER — THROMBIN 20000 UNITS EX SOLR
CUTANEOUS | Status: AC
  Filled 2021-11-03: qty 20000

## 2021-11-03 MED ORDER — METHOCARBAMOL 1000 MG/10ML IJ SOLN
500.0000 mg | Freq: Four times a day (QID) | INTRAVENOUS | Status: DC | PRN
  Filled 2021-11-03: qty 5

## 2021-11-03 MED ORDER — ORAL CARE MOUTH RINSE: 15.0000 mL | Freq: Once | OROMUCOSAL | Status: AC

## 2021-11-03 MED ORDER — ONDANSETRON HCL 4 MG PO TABS: 4.0000 mg | ORAL_TABLET | Freq: Four times a day (QID) | ORAL | Status: DC | PRN

## 2021-11-03 MED ORDER — METHOCARBAMOL 500 MG PO TABS: 500.0000 mg | ORAL_TABLET | Freq: Four times a day (QID) | ORAL | 1 refills | Status: DC | PRN

## 2021-11-03 MED ORDER — ONDANSETRON HCL 4 MG/2ML IJ SOLN: 4.0000 mg | Freq: Once | INTRAMUSCULAR | Status: DC | PRN

## 2021-11-03 MED ORDER — ACETAMINOPHEN 500 MG PO TABS
1000.0000 mg | ORAL_TABLET | Freq: Once | ORAL | Status: AC
  Administered 2021-11-03: 1000 mg via ORAL
  Filled 2021-11-03: qty 2

## 2021-11-03 MED ORDER — PHENYLEPHRINE 80 MCG/ML (10ML) SYRINGE FOR IV PUSH (FOR BLOOD PRESSURE SUPPORT)
PREFILLED_SYRINGE | INTRAVENOUS | Status: DC | PRN
  Administered 2021-11-03: 160 ug via INTRAVENOUS

## 2021-11-03 MED ORDER — CEFAZOLIN SODIUM-DEXTROSE 2-4 GM/100ML-% IV SOLN
INTRAVENOUS | Status: AC
  Filled 2021-11-03: qty 100

## 2021-11-03 MED ORDER — PHENOL 1.4 % MT LIQD: 1.0000 | OROMUCOSAL | Status: DC | PRN

## 2021-11-03 MED ORDER — SODIUM CHLORIDE 0.9% FLUSH
3.0000 mL | Freq: Two times a day (BID) | INTRAVENOUS | Status: DC
  Administered 2021-11-03: 3 mL via INTRAVENOUS

## 2021-11-03 MED ORDER — FENTANYL CITRATE (PF) 250 MCG/5ML IJ SOLN
INTRAMUSCULAR | Status: DC | PRN
  Administered 2021-11-03 (×2): 50 ug via INTRAVENOUS
  Administered 2021-11-03: 100 ug via INTRAVENOUS

## 2021-11-03 MED ORDER — OXYCODONE HCL 5 MG/5ML PO SOLN: 5.0000 mg | Freq: Once | ORAL | Status: DC | PRN

## 2021-11-03 MED ORDER — PHENYLEPHRINE HCL-NACL 20-0.9 MG/250ML-% IV SOLN
INTRAVENOUS | Status: DC | PRN
  Administered 2021-11-03: 40 ug/min via INTRAVENOUS

## 2021-11-03 MED ORDER — ROCURONIUM BROMIDE 10 MG/ML (PF) SYRINGE
PREFILLED_SYRINGE | INTRAVENOUS | Status: DC | PRN
  Administered 2021-11-03: 100 mg via INTRAVENOUS

## 2021-11-03 MED ORDER — DEXAMETHASONE SODIUM PHOSPHATE 4 MG/ML IJ SOLN: 4.0000 mg | Freq: Four times a day (QID) | INTRAMUSCULAR | Status: DC

## 2021-11-03 MED ORDER — OXYCODONE HCL 5 MG PO TABS: 10.0000 mg | ORAL_TABLET | ORAL | 0 refills | Status: DC | PRN

## 2021-11-03 MED ORDER — ACETAMINOPHEN 500 MG PO TABS
1000.0000 mg | ORAL_TABLET | Freq: Four times a day (QID) | ORAL | Status: DC
  Administered 2021-11-03: 1000 mg via ORAL
  Filled 2021-11-03: qty 2

## 2021-11-03 MED ORDER — SODIUM CHLORIDE 0.9% FLUSH: 3.0000 mL | INTRAVENOUS | Status: DC | PRN

## 2021-11-03 MED ORDER — SUGAMMADEX SODIUM 200 MG/2ML IV SOLN
INTRAVENOUS | Status: DC | PRN
  Administered 2021-11-03: 200 mg via INTRAVENOUS

## 2021-11-03 MED ORDER — DEXAMETHASONE SODIUM PHOSPHATE 10 MG/ML IJ SOLN
INTRAMUSCULAR | Status: AC
  Filled 2021-11-03: qty 1

## 2021-11-03 MED ORDER — LIDOCAINE 2% (20 MG/ML) 5 ML SYRINGE
INTRAMUSCULAR | Status: DC | PRN
  Administered 2021-11-03: 100 mg via INTRAVENOUS

## 2021-11-03 MED ORDER — METOPROLOL TARTRATE 25 MG PO TABS: 25.0000 mg | ORAL_TABLET | Freq: Two times a day (BID) | ORAL | Status: DC

## 2021-11-03 MED ORDER — PANTOPRAZOLE SODIUM 40 MG PO TBEC: 40.0000 mg | DELAYED_RELEASE_TABLET | Freq: Two times a day (BID) | ORAL | Status: DC

## 2021-11-03 MED ORDER — CEFAZOLIN SODIUM-DEXTROSE 2-4 GM/100ML-% IV SOLN
2.0000 g | Freq: Three times a day (TID) | INTRAVENOUS | Status: DC
  Administered 2021-11-03: 2 g via INTRAVENOUS
  Filled 2021-11-03: qty 100

## 2021-11-03 MED ORDER — ASPIRIN EC 81 MG PO TBEC
81.0000 mg | DELAYED_RELEASE_TABLET | Freq: Every day | ORAL | Status: DC
  Administered 2021-11-03: 81 mg via ORAL
  Filled 2021-11-03: qty 1

## 2021-11-03 MED ORDER — SODIUM CHLORIDE 0.9 % IV SOLN: 250.0000 mL | INTRAVENOUS | Status: DC

## 2021-11-03 MED ORDER — HYDROMORPHONE HCL 1 MG/ML IJ SOLN
INTRAMUSCULAR | Status: DC | PRN
Start: 2021-11-03 — End: 2021-11-03
  Administered 2021-11-03 (×2): .25 mg via INTRAVENOUS

## 2021-11-03 MED ORDER — KETAMINE HCL 50 MG/5ML IJ SOSY
PREFILLED_SYRINGE | INTRAMUSCULAR | Status: AC
  Filled 2021-11-03: qty 5

## 2021-11-03 MED ORDER — LIDOCAINE 2% (20 MG/ML) 5 ML SYRINGE
INTRAMUSCULAR | Status: AC
  Filled 2021-11-03: qty 5

## 2021-11-03 MED ORDER — HYDROMORPHONE HCL 1 MG/ML IJ SOLN
INTRAMUSCULAR | Status: AC
  Filled 2021-11-03: qty 0.5

## 2021-11-03 MED ORDER — 0.9 % SODIUM CHLORIDE (POUR BTL) OPTIME
TOPICAL | Status: DC | PRN
Start: 2021-11-03 — End: 2021-11-03
  Administered 2021-11-03 (×2): 1000 mL

## 2021-11-03 MED ORDER — LACTATED RINGERS IV SOLN: INTRAVENOUS | Status: DC

## 2021-11-03 MED ORDER — PHENYLEPHRINE 80 MCG/ML (10ML) SYRINGE FOR IV PUSH (FOR BLOOD PRESSURE SUPPORT)
PREFILLED_SYRINGE | INTRAVENOUS | Status: AC
  Filled 2021-11-03: qty 10

## 2021-11-03 MED ORDER — METHOCARBAMOL 500 MG PO TABS
500.0000 mg | ORAL_TABLET | Freq: Four times a day (QID) | ORAL | Status: DC | PRN
  Administered 2021-11-03: 500 mg via ORAL
  Filled 2021-11-03: qty 1

## 2021-11-03 MED ORDER — POTASSIUM CHLORIDE IN NACL 20-0.9 MEQ/L-% IV SOLN: INTRAVENOUS | Status: DC

## 2021-11-03 MED ORDER — MORPHINE SULFATE (PF) 2 MG/ML IV SOLN: 2.0000 mg | INTRAVENOUS | Status: DC | PRN

## 2021-11-03 MED ORDER — DEXAMETHASONE SODIUM PHOSPHATE 10 MG/ML IJ SOLN
INTRAMUSCULAR | Status: DC | PRN
  Administered 2021-11-03: 10 mg via INTRAVENOUS

## 2021-11-03 MED ORDER — MENTHOL 3 MG MT LOZG: 1.0000 | LOZENGE | OROMUCOSAL | Status: DC | PRN

## 2021-11-03 MED ORDER — DEXAMETHASONE 4 MG PO TABS
4.0000 mg | ORAL_TABLET | Freq: Four times a day (QID) | ORAL | Status: DC
  Administered 2021-11-03: 4 mg via ORAL
  Filled 2021-11-03: qty 1

## 2021-11-03 MED ORDER — ALLOPURINOL 100 MG PO TABS: 100.0000 mg | ORAL_TABLET | Freq: Two times a day (BID) | ORAL | Status: DC

## 2021-11-03 MED ORDER — AMLODIPINE BESYLATE 5 MG PO TABS: 7.5000 mg | ORAL_TABLET | Freq: Every day | ORAL | Status: DC

## 2021-11-03 MED ORDER — THROMBIN 5000 UNITS EX SOLR
CUTANEOUS | Status: AC
  Filled 2021-11-03: qty 5000

## 2021-11-03 MED ORDER — EZETIMIBE 10 MG PO TABS
10.0000 mg | ORAL_TABLET | Freq: Every day | ORAL | Status: DC
  Administered 2021-11-03: 10 mg via ORAL
  Filled 2021-11-03: qty 1

## 2021-11-03 MED ORDER — IRBESARTAN 150 MG PO TABS
75.0000 mg | ORAL_TABLET | Freq: Every day | ORAL | Status: DC
  Filled 2021-11-03: qty 1

## 2021-11-03 MED ORDER — ONDANSETRON HCL 4 MG/2ML IJ SOLN: 4.0000 mg | Freq: Four times a day (QID) | INTRAMUSCULAR | Status: DC | PRN

## 2021-11-03 MED ORDER — BUPIVACAINE HCL (PF) 0.25 % IJ SOLN
INTRAMUSCULAR | Status: AC
  Filled 2021-11-03: qty 30

## 2021-11-03 SURGICAL SUPPLY — 62 items
BAG COUNTER SPONGE SURGICOUNT (BAG) ×2 IMPLANT
BASKET BONE COLLECTION (BASKET) ×2 IMPLANT
BENZOIN TINCTURE PRP APPL 2/3 (GAUZE/BANDAGES/DRESSINGS) ×2 IMPLANT
BIT DRILL PLIF MAS DISP 5.5MM (DRILL) IMPLANT
BLADE CLIPPER SURG (BLADE) IMPLANT
BONE MATRIX OSTEOCEL PRO MED (Bone Implant) ×1 IMPLANT
BUR CARBIDE MATCH 3.0 (BURR) ×2 IMPLANT
CAGE COROENT LRG MP 11X9X28-8 (Cage) ×2 IMPLANT
CANISTER SUCT 3000ML PPV (MISCELLANEOUS) ×2 IMPLANT
CNTNR URN SCR LID CUP LEK RST (MISCELLANEOUS) ×1 IMPLANT
CONT SPEC 4OZ STRL OR WHT (MISCELLANEOUS) ×2
COVER BACK TABLE 60X90IN (DRAPES) ×2 IMPLANT
DERMABOND ADVANCED (GAUZE/BANDAGES/DRESSINGS) ×1
DERMABOND ADVANCED .7 DNX12 (GAUZE/BANDAGES/DRESSINGS) ×1 IMPLANT
DRAPE C-ARM 42X72 X-RAY (DRAPES) ×5 IMPLANT
DRAPE LAPAROTOMY 100X72X124 (DRAPES) ×2 IMPLANT
DRAPE SURG 17X23 STRL (DRAPES) ×1 IMPLANT
DRILL PLIF MAS DISP 5.5MM (DRILL) ×2
DRSG OPSITE POSTOP 4X6 (GAUZE/BANDAGES/DRESSINGS) ×1 IMPLANT
DURAPREP 26ML APPLICATOR (WOUND CARE) ×2 IMPLANT
ELECT REM PT RETURN 9FT ADLT (ELECTROSURGICAL) ×2
ELECTRODE REM PT RTRN 9FT ADLT (ELECTROSURGICAL) ×1 IMPLANT
EVACUATOR 1/8 PVC DRAIN (DRAIN) ×2 IMPLANT
GAUZE 4X4 16PLY ~~LOC~~+RFID DBL (SPONGE) IMPLANT
GLOVE BIO SURGEON STRL SZ7 (GLOVE) ×1 IMPLANT
GLOVE BIO SURGEON STRL SZ8 (GLOVE) ×4 IMPLANT
GLOVE BIOGEL PI IND STRL 7.0 (GLOVE) IMPLANT
GLOVE BIOGEL PI INDICATOR 7.0 (GLOVE) ×1
GLOVE SURG SS PI 6.5 STRL IVOR (GLOVE) ×4 IMPLANT
GLOVE SURG SS PI 7.5 STRL IVOR (GLOVE) ×2 IMPLANT
GOWN STRL REUS W/ TWL LRG LVL3 (GOWN DISPOSABLE) IMPLANT
GOWN STRL REUS W/ TWL XL LVL3 (GOWN DISPOSABLE) ×2 IMPLANT
GOWN STRL REUS W/TWL 2XL LVL3 (GOWN DISPOSABLE) IMPLANT
GOWN STRL REUS W/TWL LRG LVL3 (GOWN DISPOSABLE) ×1
GOWN STRL REUS W/TWL XL LVL3 (GOWN DISPOSABLE) ×4
HEMOSTAT POWDER KIT SURGIFOAM (HEMOSTASIS) ×2 IMPLANT
KIT BASIN OR (CUSTOM PROCEDURE TRAY) ×2 IMPLANT
KIT GRAFTMAG DEL NEURO DISP (NEUROSURGERY SUPPLIES) IMPLANT
KIT TURNOVER KIT B (KITS) ×2 IMPLANT
MILL MEDIUM DISP (BLADE) ×2 IMPLANT
NDL HYPO 25X1 1.5 SAFETY (NEEDLE) ×1 IMPLANT
NEEDLE HYPO 25X1 1.5 SAFETY (NEEDLE) ×2 IMPLANT
NS IRRIG 1000ML POUR BTL (IV SOLUTION) ×3 IMPLANT
PACK LAMINECTOMY NEURO (CUSTOM PROCEDURE TRAY) ×2 IMPLANT
PAD ARMBOARD 7.5X6 YLW CONV (MISCELLANEOUS) ×6 IMPLANT
ROD ARM15T 40MM (Rod) ×2 IMPLANT
SCREW LOCK (Screw) ×12 IMPLANT
SCREW LOCK FXNS SPNE MAS PL (Screw) IMPLANT
SCREW SHANK 6.5X40 (Screw) ×4 IMPLANT
SCREW SHANK 6.5X40 NS LF (Screw) IMPLANT
SCREW TULIP 5.5 (Screw) ×2 IMPLANT
SPONGE SURGIFOAM ABS GEL 100 (HEMOSTASIS) ×1 IMPLANT
SPONGE T-LAP 4X18 ~~LOC~~+RFID (SPONGE) IMPLANT
STRIP CLOSURE SKIN 1/2X4 (GAUZE/BANDAGES/DRESSINGS) ×3 IMPLANT
SUT VIC AB 0 CT1 18XCR BRD8 (SUTURE) ×1 IMPLANT
SUT VIC AB 0 CT1 8-18 (SUTURE) ×2
SUT VIC AB 2-0 CP2 18 (SUTURE) ×2 IMPLANT
SUT VIC AB 3-0 SH 8-18 (SUTURE) ×4 IMPLANT
TOWEL GREEN STERILE (TOWEL DISPOSABLE) ×2 IMPLANT
TOWEL GREEN STERILE FF (TOWEL DISPOSABLE) ×2 IMPLANT
TRAY FOLEY MTR SLVR 16FR STAT (SET/KITS/TRAYS/PACK) ×2 IMPLANT
WATER STERILE IRR 1000ML POUR (IV SOLUTION) ×2 IMPLANT

## 2021-11-03 NOTE — Plan of Care (Signed)
?  Problem: Education: Goal: Ability to verbalize activity precautions or restrictions will improve Outcome: Completed/Met Goal: Knowledge of the prescribed therapeutic regimen will improve Outcome: Completed/Met Goal: Understanding of discharge needs will improve Outcome: Completed/Met   Problem: Activity: Goal: Ability to avoid complications of mobility impairment will improve Outcome: Completed/Met Goal: Ability to tolerate increased activity will improve Outcome: Completed/Met Goal: Will remain free from falls Outcome: Completed/Met   Problem: Bowel/Gastric: Goal: Gastrointestinal status for postoperative course will improve Outcome: Completed/Met   Problem: Clinical Measurements: Goal: Ability to maintain clinical measurements within normal limits will improve Outcome: Completed/Met Goal: Postoperative complications will be avoided or minimized Outcome: Completed/Met Goal: Diagnostic test results will improve Outcome: Completed/Met   Problem: Pain Management: Goal: Pain level will decrease Outcome: Completed/Met   Problem: Skin Integrity: Goal: Will show signs of wound healing Outcome: Completed/Met   Problem: Health Behavior/Discharge Planning: Goal: Identification of resources available to assist in meeting health care needs will improve Outcome: Completed/Met   Problem: Bladder/Genitourinary: Goal: Urinary functional status for postoperative course will improve Outcome: Completed/Met   

## 2021-11-03 NOTE — Op Note (Signed)
11/03/2021 ? ?11:04 AM ? ?PATIENT:  [name] [name]  76 y.o. male ? 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 ?POST-OPERATIVE DIAGNOSIS:  same ? ?PROCEDURE:   ?1. Decompressive lumbar laminectomy, hemi facetectomy and foraminotomies L3-4 requiring more work than would be required for a simple exposure of the disk for PLIF in order to adequately decompress the neural elements and address the spinal stenosis ?2. Posterior lumbar interbody fusion L3-4 using peek interbody cages packed with morcellized allograft and autograft  ?3. Posterior fixation L3-4 using NuVasive cortical pedicle screws.  ?4. Intertransverse arthrodesis L3-4 using morcellized autograft and allograft. ?5.  Removal of nonsegmental instrumentation L4-5 ? ?SURGEON:  Marikay Alar, MD ? ?ASSISTANTS: Verlin Dike FNP ? ?ANESTHESIA:  General ? ?EBL: 150 ml ? ?Total I/O ?In: 1500 [I.V.:1500] ?Out: 275 [Urine:125; Blood:150] ? ?BLOOD ADMINISTERED:none ? ?DRAINS: none  ? ?INDICATION FOR PROCEDURE: This patient presented with back pain with right leg pain. Imaging revealed very adjacent level stenosis at L3-4 with a grade 1 spondylolisthesis at L3-4 above a previous L4-5 fusion. The patient tried a reasonable attempt at conservative medical measures without relief. I recommended decompression and instrumented fusion to address the stenosis as well as the segmental  instability.  Patient understood the risks, benefits, and alternatives and potential outcomes and wished to proceed. ? ?PROCEDURE DETAILS:  ?The patient was brought to the operating room. After induction of generalized endotracheal anesthesia the patient was rolled into the prone position on chest rolls and all pressure points were padded. The patient's lumbar region was cleaned and then prepped with DuraPrep and draped in the usual sterile fashion. Anesthesia was injected and then a dorsal midline  incision was made and carried down to the lumbosacral fascia. The fascia was opened and the paraspinous musculature was taken down in a subperiosteal fashion to expose L3-4 as well as the previously placed instrumentation. A self-retaining retractor was placed. Intraoperative fluoroscopy confirmed my level, and I started with removal of the old instrumentation.  The old locking caps were removed and the rods were removed.  Each of the 4 screws had good purchase.  I then turned my attention to the placement of the L3 cortical pedicle screws. The pedicle screw entry zones were identified utilizing surface landmarks and  AP and lateral fluoroscopy. I scored the cortex with the high-speed drill and then used the hand drill to drill an upward and outward direction into the pedicle. I then tapped line to line. I then placed a 6.5 x 40 mm cortical pedicle screw into the pedicles of L3 bilaterally.  ? ? I then turned my attention to the decompression and complete lumbar laminectomies, hemi- facetectomies, and foraminotomies were performed at L3-4.  My nurse practitioner was directly involved in the decompression and exposure of the neural elements. the patient had significant spinal stenosis and this required more work than would be required for a simple exposure of the disc for posterior lumbar interbody fusion which would only require a limited laminotomy. Much more generous decompression and generous foraminotomy was undertaken in order to adequately decompress the neural elements and address the patient's leg pain. The yellow ligament was removed to expose the underlying dura and nerve roots, and generous foraminotomies were performed to adequately decompress the neural elements. Both the exiting and traversing nerve roots were decompressed on both sides until a coronary dilator passed easily along the nerve roots. Once the decompression was complete, I turned my  attention to the posterior lower lumbar interbody fusion.  The epidural venous vasculature was coagulated and cut sharply. Disc space was incised and the initial discectomy was performed with pituitary rongeurs. The disc space was distracted with sequential distractors to a height of 11 mm. We then used a series of scrapers and shavers to prepare the endplates for fusion. The midline was prepared with Epstein curettes. Once the complete discectomy was finished, we packed an appropriate sized interbody cage with local autograft and morcellized allograft, gently retracted the nerve root, and tapped the cage into position at L3-4.  The midline between the cages was packed with morselized autograft and allograft.  My nurse practitioner assisted with the posterior lumbar interbody fusion. ? ?We then decorticated the transverse processes and laid a mixture of morcellized autograft and allograft out over these to perform intertransverse arthrodesis at L3-4. We then placed lordotic rods into the multiaxial screw heads of the pedicle screws of L3 and L4 and locked these in position with the locking caps and anti-torque device.  We left the L5 screws in place in case he ever developed adjacent level disease at L5-S1.  We then checked our construct with AP and lateral fluoroscopy. Irrigated with copious amounts of bacitracin-containing saline solution. Inspected the nerve roots once again to assure adequate decompression, lined to the dura with Gelfoam,  and then we closed the muscle and the fascia with 0 Vicryl. Closed the subcutaneous tissues with 2-0 Vicryl and subcuticular tissues with 3-0 Vicryl. The skin was closed with benzoin and Steri-Strips. Dressing was then applied, the patient was awakened from general anesthesia and transported to the recovery room in stable condition. At the end of the procedure all sponge, needle and instrument counts were correct.  My nurse practitioner was scrubbed for the entire case and helped with the posterior lumbar interbody fusion, safe  retraction of the neural elements, the placement of instrumentation, and the closure. ? ?PLAN OF CARE: admit to inpatient ? ?PATIENT DISPOSITION:  PACU - hemodynamically stable. ?  ?Delay start of Pharmacological VTE agent (>24hrs) due to surgical blood loss or risk of bleeding:  yes ? ? ?

## 2021-11-03 NOTE — H&P (Signed)
Subjective: ?Patient is a 76 y.o. male admitted for adjacent level disease. Onset of symptoms was several months ago, gradually worsening since that time.  The pain is rated severe, and is located at the across the lower back and radiates to RLE. The pain is described as aching and occurs all day. The symptoms have been progressive. Symptoms are exacerbated by exercise, standing, and walking for more than a few minutes. 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 ?Past Medical History:  ?Diagnosis Date  ? BPH associated with nocturia   ? CAD (coronary artery disease), native coronary artery   ? CARDIOLOGIST-  DR Gloris Manchester TURNER--  normal myoview 03/ 2017 but Cardiac CT 04/ 2017 calcium score 1951, >70% LAD stenosis possible disease RCA , LCX--- cath done showed severe 3V disease S/P  CABG 04/ 2017  ? Chronic back pain   ? herniated disc  ? CKD (chronic kidney disease), stage III (HCC)   ? Diverticulosis   ? GERD (gastroesophageal reflux disease)   ? Gout   ? as of 03-20-2017 per pt stable  ? History of kidney stones   ? METABOLIC STONE DISEASE  ? History of urinary retention   ? POST OP SURGERY 08/ 2013  ? Hypercalciuria   ? Hyperlipidemia   ? takes Crestor daily  ? Hypertension   ? LBBB (left bundle branch block)   ? Muscle cramps   ? legs occasonally in am  ? Renal calculus, right   ? Renal cyst, right   ? 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 LIMA to LAD, Sequential SVG to OM1-OM2, EVH via right thigh  ? Seasonal allergies   ?  ?Past Surgical History:  ?Procedure Laterality Date  ? CARDIAC CATHETERIZATION N/A 10/28/2015  ? Procedure: Left Heart Cath and Coronary Angiography;  Surgeon: [name], MD;  Location: [facility] IN[facility]AS[facility] CV [facility];  Service: Cardiovascular;  Laterality: N/A;  ? CARDIAC CATHETERIZATION  05-21-1995  dr Verdis Prime  ? non-obstructive cad;  normal lvf  ? CARDIOVASCULAR STRESS TEST  09/23/2015  dr Gloris Manchester turner  ? Normal nuclear study w/ no prior infarct and no ischemia/  normal LV  function and wall motion , nuclear stress ef 54%  ? COLONOSCOPY  last one 2009  ? CORONARY ARTERY BYPASS GRAFT N/A 11/04/2015  ? Procedure: CORONARY ARTERY BYPASS GRAFTING (CABG) times three using left internal mammary artery and right saphenous vein harvested with endoscope.;  Surgeon: [name], MD;  Location: [facility];  Service: Open Heart Surgery;  Laterality: N/A;  ? CYSTOSCOPY WITH RETROGRADE PYELOGRAM, URETEROSCOPY AND STENT PLACEMENT Left 03/09/2017  ? Procedure: CYSTOSCOPY WITH RETROGRADE PYELOGRAM, URETEROSCOPY AND STENT PLACEMENT;  Surgeon: Sebastian Ache, MD;  Location: [facility];  Service: Urology;  Laterality: Left;  ? CYSTOSCOPY WITH URETEROSCOPY  07/28/2012  ? Procedure: CYSTOSCOPY WITH URETEROSCOPY;  Surgeon: [name], MD;  Location: [facility];  Service: Urology;  Laterality: Left;   ?  ? CYSTOSCOPY/URETEROSCOPY/HOLMIUM LASER/STENT PLACEMENT Right 03/25/2017  ? Procedure: CYSTOSCOPY RIGHT URETEROSCOPYVHOLMIUM LASER STENT PLACEMENT;  Surgeon: Crista Elliot, MD;  Location: [facility];  Service: Urology;  Laterality: Right;  ? ESOPHAGOGASTRODUODENOSCOPY (EGD) WITH ESOPHAGEAL DILATION  05/ 2018 approx.  ? EXTRACORPOREAL SHOCK WAVE LITHOTRIPSY  multiple last one 05-29-2012  ? FOOT SURGERY  1990s  ? right-removed bone spur   ? HOLMIUM LASER APPLICATION  07/28/2012  ? Procedure: HOLMIUM LASER APPLICATION;  Surgeon: [name], MD;  Location: [facility];  Service: Urology;  Laterality: Left;  left lithotripsy  ? HOLMIUM LASER APPLICATION Left 03/09/2017  ? Procedure: HOLMIUM LASER APPLICATION;  Surgeon: Sebastian AcheManny, Theodore, MD;  Location: [facility];  Service: Urology;  Laterality: Left;  ? LAPAROSCOPIC APPENDECTOMY N/A 12/20/2018  ? Procedure: APPENDECTOMY LAPAROSCOPIC;  Surgeon: Axel Filleramirez, Armando, MD;  Location: [facility];  Service: General;  Laterality: N/A;  ? POSTERIOR LUMBAR FUSION  03/07/2012  ? w/ laminecotmy L4-5  ? PYELOLITHOTOMY Right 01/1982  ? TEE WITHOUT CARDIOVERSION N/A 11/04/2015  ? Procedure:  TRANSESOPHAGEAL ECHOCARDIOGRAM (TEE);  Surgeon: Purcell Nailslarence H Owen, MD;  [facility];  Service: Open Heart Surgery;  Laterality: N/A;  ? TONSILLECTOMY  age 76  ? URETEROLITHOTOMY Right 03/1994  ?  ?Prior to Admission medications   ?Medication Sig Start Date End Date Taking? Authorizing Provider  ?allopurinol (ZYLOPRIM) 100 MG tablet Take 100 mg by mouth 2 (two) times daily.    Yes [provider]  ?amLODipine (NORVASC) 5 MG tablet Take 1.5 tablets (7.5 mg total) by mouth daily. 10/10/21  Yes Dyann KiefLenze, Michele M, PA-C  ?aspirin EC 81 MG EC tablet Take 1 tablet (81 mg total) by mouth daily. 01/19/16  Yes Turner, Cornelious Bryantraci R, MD  ?cholecalciferol (VITAMIN D) 1000 UNITS tablet Take 1,000 Units by mouth every morning.    Yes [provider]  ?Coenzyme Q10 (COQ10) 100 MG CAPS Take 100 mg by mouth every evening.   Yes [provider]  ?Evolocumab (REPATHA SURECLICK) 140 MG/ML SOAJ Inject 1 pen into the skin every 14 (fourteen) days. 05/05/21  Yes Turner, Cornelious Bryantraci R, MD  ?ezetimibe (ZETIA) 10 MG tablet TAKE 1 TABLET(10 MG) BY MOUTH DAILY 04/20/21  Yes Turner, Cornelious Bryantraci R, MD  ?fexofenadine (ALLEGRA) 180 MG tablet Take 180 mg by mouth daily. 02/11/15  Yes [provider]  ?metoprolol tartrate (LOPRESSOR) 25 MG tablet TAKE 1 TABLET(25 MG) BY MOUTH TWICE DAILY 04/20/21  Yes Turner, Cornelious Bryantraci R, MD  ?Multiple Vitamin (MULTIVITAMIN WITH MINERALS) TABS Take 1 tablet by mouth every morning.    Yes [provider]  ?pantoprazole (PROTONIX) 40 MG tablet Take 1 tablet (40 mg total) by mouth 2 (two) times daily. 09/25/21  Yes Turner, Cornelious Bryantraci R, MD  ?pravastatin (PRAVACHOL) 80 MG tablet Take 1 tablet (80 mg total) by mouth every evening. 04/20/21  Yes Turner, Cornelious Bryantraci R, MD  ?telmisartan (MICARDIS) 20 MG tablet Take 1 tablet (20 mg total) by mouth every morning. 04/20/21  Yes Turner, Cornelious Bryantraci R, MD  ?icosapent Ethyl (VASCEPA) 1 g capsule TAKE 2 CAPSULES(2 GRAMS) BY MOUTH TWICE DAILY ?Patient not taking: Reported on 10/24/2021  07/05/21   Quintella Reicherturner, Traci R, MD  ? ?Allergies  ?Allergen Reactions  ? Atorvastatin Other (See Comments)  ?  Reaction: dizziness/ dyspepsia. Muscle cramps all over body. ?Other reaction(s): dizziness and dyspepsia  ? Hydrochlorothiazide Other (See Comments)  ?  Reaction: arthralgias/ skin burning ?Other reaction(s): arthralgias/skin burning  ? Rosuvastatin Other (See Comments)  ?  Reaction: muscle cramping ?Other reaction(s): hand cramping  ?  ?Social History  ? ?Tobacco Use  ? Smoking status: Never  ? Smokeless tobacco: Never  ?Substance Use Topics  ? Alcohol use: No  ?  Alcohol/week: 0.0 standard drinks  ?  ?Family History  ?Problem Relation Age of Onset  ? Dementia Mother   ? Prostate cancer Father   ? ?  ?Review of Systems ? ?Positive ROS: neg ? ?All other systems have been reviewed and were otherwise negative with the exception of those mentioned in  the HPI and as above. ? ?Objective: ?Vital signs in last 24 hours: ?Temp:  [97.8 ?F (36.6 ?C)] 97.8 ?F (36.6 ?C) (04/21 8099) ?Pulse Rate:  [80] 80 (04/21 0553) ?Resp:  [18] 18 (04/21 0553) ?BP: (127)/(89) 127/89 (04/21 0553) ?SpO2:  [95 %] 95 % (04/21 0553) ?Weight:  [99.8 kg] 99.8 kg (04/21 0553) ? ?General Appearance: Alert, cooperative, no distress, appears stated age ?Head: Normocephalic, without obvious abnormality, atraumatic ?Eyes: PERRL, conjunctiva/corneas clear, EOM's intact    ?Neck: Supple, symmetrical, trachea midline ?Back: Symmetric, no curvature, ROM normal, no CVA tenderness ?Lungs:  respirations unlabored ?Heart: Regular rate and rhythm ?Abdomen: Soft, non-tender ?Extremities: Extremities normal, atraumatic, no cyanosis or edema ?Pulses: 2+ and symmetric all extremities ?Skin: Skin color, texture, turgor normal, no rashes or lesions ? ?NEUROLOGIC:  ? ?Mental status: Alert and oriented x4,  no aphasia, good attention span, fund of knowledge, and memory ?Motor Exam - grossly normal ?Sensory Exam - grossly normal ?Reflexes: 1+ ?Coordination - grossly  normal ?Gait - grossly normal ?Balance - grossly normal ?Cranial Nerves: ?I: smell Not tested  ?II: visual acuity  OS: nl    OD: nl  ?II: visual fields Full to confrontation  ?II: pupils Equal, round, reac

## 2021-11-03 NOTE — Anesthesia Postprocedure Evaluation (Signed)
Anesthesia Post Note ? ?Patient: [name] [name] ? ?Procedure(s) Performed: Posterior Lumbar Interbody Fusion  - Lumbar three-Lumbar four with extension of instrumentation (Back) ? ?  ? ?Patient location during evaluation: PACU ?Anesthesia Type: General ?Level of consciousness: awake and alert ?Pain management: pain level controlled ?Vital Signs Assessment: post-procedure vital signs reviewed and stable ?Respiratory status: spontaneous breathing, nonlabored ventilation and respiratory function stable ?Cardiovascular status: blood pressure returned to baseline and stable ?Postop Assessment: no apparent nausea or vomiting ?Anesthetic complications: no ? ? ?No notable events documented. ? ?Last Vitals:  ?Vitals:  ? 11/03/21 1200 11/03/21 1215  ?BP: 120/62 104/61  ?Pulse: 76 74  ?Resp: 13 15  ?Temp:  36.4 ?C  ?SpO2: 90% 92%  ?  ?Last Pain:  ?Vitals:  ? 11/03/21 1215  ?TempSrc:   ?PainSc: 0-No pain  ? ? ?  ?  ?  ?  ?  ?  ? ?Lidia Collum ? ? ? ? ?

## 2021-11-03 NOTE — Progress Notes (Signed)
PT Cancellation Note ? ?Patient Details ?Name: [name] [name] ?MRN: [id number] ?DOB: 1945-10-29 ? ? ?Cancelled Treatment:    Reason Eval/Treat Not Completed: PT screened, no needs identified, will sign off Per OT, pt with no skilled PT needs at this time. If needs change, please re-consult.  ? ?Farley Ly, PT, DPT  ?Acute Rehabilitation Services  ?Pager: [phone] ?Office: [phone] ? ? ? ?[name] [name] ?11/03/2021, 3:44 PM ?

## 2021-11-03 NOTE — Discharge Summary (Signed)
Physician Discharge Summary  ?Patient ID: ?[name] [name] ?MRN: [id number] ?DOB/AGE: [birth year] 76 y.o. ? ?Admit date: 11/03/2021 ?Discharge date: 11/03/2021 ? ?Admission Diagnoses: Adjacent level stenosis with spondylolisthesis with back pain and radiculopathy ? ? ?Discharge Diagnoses: Same ? ? ?Discharged Condition: good ? ?Hospital Course: The patient was admitted on 11/03/2021 and taken to the operating room where the patient underwent posterior lumbar interbody fusion L3-4. The patient tolerated the procedure well and was taken to the recovery room and then to the floor in stable condition. The hospital course was routine. There were no complications. The wound remained clean dry and intact. Pt had appropriate back soreness. No complaints of leg pain or new N/T/W. The patient remained afebrile with stable vital signs, and tolerated a regular diet. The patient continued to increase activities, and pain was well controlled with oral pain medications.  ? ?Consults: None ? ?Significant Diagnostic Studies:  ?Results for orders placed or performed during the hospital encounter of 10/30/21  ?Surgical pcr screen  ? Specimen: Nasal Mucosa; Nasal Swab  ?Result Value Ref Range  ? MRSA, PCR NEGATIVE NEGATIVE  ? Staphylococcus aureus POSITIVE (A) NEGATIVE  ?CBC per protocol  ?Result Value Ref Range  ? WBC 8.1 4.0 - 10.5 K/uL  ? RBC 4.87 4.22 - 5.81 MIL/uL  ? Hemoglobin 15.1 13.0 - 17.0 g/dL  ? HCT 45.3 39.0 - 52.0 %  ? MCV 93.0 80.0 - 100.0 fL  ? MCH 31.0 26.0 - 34.0 pg  ? MCHC 33.3 30.0 - 36.0 g/dL  ? RDW 13.5 11.5 - 15.5 %  ? Platelets 172 150 - 400 K/uL  ? nRBC 0.0 0.0 - 0.2 %  ?Basic metabolic panel per protocol  ?Result Value Ref Range  ? Sodium 142 135 - 145 mmol/L  ? Potassium 3.9 3.5 - 5.1 mmol/L  ? Chloride 108 98 - 111 mmol/L  ? CO2 25 22 - 32 mmol/L  ? Glucose, Bld 125 (H) 70 - 99 mg/dL  ? BUN 15 8 - 23 mg/dL  ? Creatinine, Ser 1.69 (H) 0.61 - 1.24 mg/dL  ? Calcium 9.8 8.9 - 10.3 mg/dL  ? GFR, Estimated 42 (L) >60  mL/min  ? Anion gap 9 5 - 15  ?Type and screen [facility]  ?Result Value Ref Range  ? ABO/RH(D) A NEG   ? Antibody Screen NEG   ? Sample Expiration 11/13/2021,2359   ? Extend sample reason    ?  NO TRANSFUSIONS OR PREGNANCY IN THE PAST 3 MONTHS ?Performed at [facility], [address] [place], [place] [address] ?  ? ? ?DG Lumbar Spine 2-3 Views ? ?Result Date: 11/03/2021 ?CLINICAL DATA:  Surgical posterior fusion of L3-4. EXAM: LUMBAR SPINE - 2-3 VIEW; DG C-ARM 1-60 MIN-NO REPORT Radiation exposure index: 24.216 mGy. COMPARISON:  September 19, 2021. FINDINGS: Two intraoperative fluoroscopic images were obtained of the lumbar spine. These demonstrate the patient be status post surgical posterior fusion of L3-4 with bilateral intrapedicular screw placement and interbody fusion. IMPRESSION: Fluoroscopic guidance provided during lumbar surgery. Electronically Signed   By: Lupita RaiderJames  Green Jr M.D.   On: 11/03/2021 10:43  ? ?DG C-Arm 1-60 Min-No Report ? ?Result Date: 11/03/2021 ?Fluoroscopy was utilized by the requesting physician.  No radiographic interpretation.  ? ?DG C-Arm 1-60 Min-No Report ? ?Result Date: 11/03/2021 ?Fluoroscopy was utilized by the requesting physician.  No radiographic interpretation.  ? ?DG C-Arm 1-60 Min-No Report ? ?Result Date: 11/03/2021 ?Fluoroscopy was utilized by the  requesting physician.  No radiographic interpretation.   ? ?Antibiotics:  ?Anti-infectives (From admission, onward)  ? ? Start     Dose/Rate Route Frequency Ordered Stop  ? 11/03/21 1600  ceFAZolin (ANCEF) IVPB 2g/100 mL premix       ? 2 g ?200 mL/hr over 30 Minutes Intravenous Every 8 hours 11/03/21 1250 11/04/21 0759  ? 11/03/21 0724  ceFAZolin (ANCEF) 2-4 GM/100ML-% IVPB       ?Note to Pharmacy: Derry Skill: cabinet override  ?    11/03/21 0724 11/03/21 1929  ? ?  ? ? ?Discharge Exam: ?Blood pressure 115/60, pulse 78, temperature 97.8 ?F (36.6 ?C), resp. rate 18, height 5\' 6"  (1.676 m), weight 99.8 kg, SpO2  94 %. ?Neurologic: Grossly normal ?Dressing clean dry and intact ? ?Discharge Medications:   ?Allergies as of 11/03/2021   ? ?   Reactions  ? Atorvastatin Other (See Comments)  ? Reaction: dizziness/ dyspepsia. Muscle cramps all over body. ?Other reaction(s): dizziness and dyspepsia  ? Hydrochlorothiazide Other (See Comments)  ? Reaction: arthralgias/ skin burning ?Other reaction(s): arthralgias/skin burning  ? Rosuvastatin Other (See Comments)  ? Reaction: muscle cramping ?Other reaction(s): hand cramping  ? ?  ? ?  ?Medication List  ?  ? ?TAKE these medications   ? ?allopurinol 100 MG tablet ?Commonly known as: ZYLOPRIM ?Take 100 mg by mouth 2 (two) times daily. ?  ?amLODipine 5 MG tablet ?Commonly known as: NORVASC ?Take 1.5 tablets (7.5 mg total) by mouth daily. ?  ?aspirin 81 MG EC tablet ?Take 1 tablet (81 mg total) by mouth daily. ?  ?cholecalciferol 1000 units tablet ?Commonly known as: VITAMIN D ?Take 1,000 Units by mouth every morning. ?  ?CoQ10 100 MG Caps ?Take 100 mg by mouth every evening. ?  ?ezetimibe 10 MG tablet ?Commonly known as: ZETIA ?TAKE 1 TABLET(10 MG) BY MOUTH DAILY ?  ?fexofenadine 180 MG tablet ?Commonly known as: ALLEGRA ?Take 180 mg by mouth daily. ?  ?icosapent Ethyl 1 g capsule ?Commonly known as: VASCEPA ?TAKE 2 CAPSULES(2 GRAMS) BY MOUTH TWICE DAILY ?  ?methocarbamol 500 MG tablet ?Commonly known as: ROBAXIN ?Take 1 tablet (500 mg total) by mouth every 6 (six) hours as needed for muscle spasms. ?  ?metoprolol tartrate 25 MG tablet ?Commonly known as: LOPRESSOR ?TAKE 1 TABLET(25 MG) BY MOUTH TWICE DAILY ?  ?multivitamin with minerals Tabs tablet ?Take 1 tablet by mouth every morning. ?  ?oxyCODONE 5 MG immediate release tablet ?Commonly known as: Oxy IR/ROXICODONE ?Take 2 tablets (10 mg total) by mouth every 4 (four) hours as needed for severe pain ((score 7 to 10)). ?  ?pantoprazole 40 MG tablet ?Commonly known as: PROTONIX ?Take 1 tablet (40 mg total) by mouth 2 (two) times daily. ?   ?pravastatin 80 MG tablet ?Commonly known as: PRAVACHOL ?Take 1 tablet (80 mg total) by mouth every evening. ?  ?Repatha SureClick 140 MG/ML Soaj ?Generic drug: Evolocumab ?Inject 1 pen into the skin every 14 (fourteen) days. ?  ?telmisartan 20 MG tablet ?Commonly known as: MICARDIS ?Take 1 tablet (20 mg total) by mouth every morning. ?  ? ?  ? ?  ?  ? ? ?  ?Durable Medical Equipment  ?(From admission, onward)  ?  ? ? ?  ? ?  Start     Ordered  ? 11/03/21 1251  DME Walker rolling  Once       ?Question:  Patient needs a walker to treat with the following condition  Answer:  S/P lumbar fusion  ? 11/03/21 1250  ? 11/03/21 1251  DME 3 n 1  Once       ? 11/03/21 1250  ? ?  ?  ? ?  ? ? ?Disposition: Home  ? ?Final Dx: Posterior lumbar interbody fusion L3-4 ? ? ? ? Follow-up Information   ? ? Tia Alert, MD. Schedule an appointment as soon as possible for a visit in 2 week(s).   ?Specialty: Neurosurgery ?Contact information: ?[address] ?[address] ?[place] [address] ?[phone] ? ? ?  ?  ? ?  ?  ? ?  ? ? ? ?Signed: ?Tia Alert ?11/03/2021, 1:13 PM ? ? ?

## 2021-11-03 NOTE — Discharge Instructions (Signed)
Wound Care °Keep the incision clean and dry remove the outer dressing in 2 days, leave the Steri-Strips intact.  °Do not put any creams, lotions, or ointments on incision. °Leave steri-strips on back.  They will fall off by themselves. ° °Activity °Walk each and every day, increasing distance each day. °No lifting greater than 5 lbs.  °No lifting no bending no twisting no driving or riding a car unless coming back and forth to see me. °If provided with back brace, wear when out of bed.  It is not necessary to wear brace in bed. ° °Diet °Resume your normal diet.  ° °Call Your Doctor If Any of These Occur °Redness, drainage, or swelling at the wound.  °Temperature greater than 101 degrees. °Severe pain not relieved by pain medication. °Incision starts to come apart. ° °Follow Up Appt °Call today for appointment in 1-2 weeks ([phone]) or for problems.  If you have any hardware placed in your spine, you will need an x-ray before your appointment. °  °

## 2021-11-03 NOTE — Anesthesia Procedure Notes (Signed)
Procedure Name: Intubation ?Date/Time: 11/03/2021 7:43 AM ?Performed by: Imagene Riches, CRNA ?Pre-anesthesia Checklist: Patient identified, Emergency Drugs available, Suction available and Patient being monitored ?Patient Re-evaluated:Patient Re-evaluated prior to induction ?Oxygen Delivery Method: Circle System Utilized ?Preoxygenation: Pre-oxygenation with 100% oxygen ?Induction Type: IV induction ?Ventilation: Mask ventilation without difficulty and Oral airway inserted - appropriate to patient size ?Laryngoscope Size: Sabra Heck and 2 ?Grade View: Grade I ?Tube type: Oral ?Tube size: 7.5 mm ?Number of attempts: 1 ?Airway Equipment and Method: Stylet and Oral airway ?Placement Confirmation: ETT inserted through vocal cords under direct vision, positive ETCO2 and breath sounds checked- equal and bilateral ?Secured at: 22 cm ?Tube secured with: Tape ?Dental Injury: Teeth and Oropharynx as per pre-operative assessment  ? ? ? ? ?

## 2021-11-03 NOTE — Transfer of Care (Signed)
Immediate Anesthesia Transfer of Care Note ? ?Patient: [name] [name] ? ?Procedure(s) Performed: Posterior Lumbar Interbody Fusion  - Lumbar three-Lumbar four with extension of instrumentation (Back) ? ?Patient Location: PACU ? ?Anesthesia Type:General ? ?Level of Consciousness: drowsy ? ?Airway & Oxygen Therapy: Patient Spontanous Breathing and Patient connected to nasal cannula oxygen ? ?Post-op Assessment: Report given to RN and Post -op Vital signs reviewed and stable ? ?Post vital signs: Reviewed and stable ? ?Last Vitals:  ?Vitals Value Taken Time  ?BP 127/66 11/03/21 1112  ?Temp    ?Pulse 81 11/03/21 1114  ?Resp 13 11/03/21 1114  ?SpO2 92 % 11/03/21 1114  ?Vitals shown include unvalidated device data. ? ?Last Pain:  ?Vitals:  ? 11/03/21 0611  ?TempSrc:   ?PainSc: 0-No pain  ?   ? ?  ? ?Complications: No notable events documented. ?

## 2021-11-03 NOTE — Evaluation (Signed)
Occupational Therapy Evaluation ?Patient Details ?Name: [name] [name] ?MRN: [id number] ?DOB: [birth year] ?Today's Date: 11/03/2021 ? ? ?History of Present Illness 76 yo male s/p PLIF L3-4 PMH CAD chronic back pain CKD diverticulosis, hx kidnehy stones, LBBB HTN HLD CABG x 3  ? ?Clinical Impression ?  ?Patient is s/p PLIF L3-4 surgery resulting in functional limitations due to the deficits listed below (see OT problem list). Pt at adequate level to d/c home and able to recall all precautions. Pt return demonstrated LB dressing with 100% back precautions.  Patient will benefit from skilled OT acutely to increase independence and safety with ADLS to allow discharge home. . ?  ?   ? ?Recommendations for follow up therapy are one component of a multi-disciplinary discharge planning process, led by the attending physician.  Recommendations may be updated based on patient status, additional functional criteria and insurance authorization.  ? ?Follow Up Recommendations ? No OT follow up  ?  ?Assistance Recommended at Discharge PRN  ?Patient can return home with the following Assist for transportation;Assistance with cooking/housework ? ?  ?Functional Status Assessment ? Patient has had a recent decline in their functional status and demonstrates the ability to make significant improvements in function in a reasonable and predictable amount of time.  ?Equipment Recommendations ? None recommended by OT  ?  ?Recommendations for Other Services   ? ? ?  ?Precautions / Restrictions Precautions ?Precautions: Back ?Precaution Comments: back handout provided and reviewed ?Required Braces or Orthoses: Spinal Brace ?Spinal Brace: Lumbar corset;Applied in sitting position ?Restrictions ?Weight Bearing Restrictions: No  ? ?  ? ?Mobility Bed Mobility ?Overal bed mobility: Independent ?  ?  ?  ?  ?  ?  ?  ?  ? ?Transfers ?Overall transfer level: Modified independent ?  ?  ?  ?  ?  ?  ?  ?  ?  ?  ? ?  ?Balance   ?  ?  ?  ?  ?  ?  ?  ?  ?  ?  ?   ?  ?  ?  ?  ?  ?  ?  ?   ? ?ADL either performed or assessed with clinical judgement  ? ?ADL Overall ADL's : Needs assistance/impaired ?Eating/Feeding: Independent ?  ?Grooming: Wash/dry hands ?  ?Upper Body Bathing: Independent ?  ?Lower Body Bathing: Modified independent;Cueing for back precautions (has reacher at home if needed) ?  ?Upper Body Dressing : Modified independent ?  ?Lower Body Dressing: Modified independent ?  ?Toilet Transfer: Modified Independent ?  ?  ?  ?  ?  ?Functional mobility during ADLs: Supervision/safety ?   ?Back handout provided and reviewed adls in detail. Pt educated on: clothing between brace, never sleep in brace, set an alarm at night for medication, avoid sitting for long periods of time, correct bed positioning for sleeping, correct sequence for bed mobility, avoiding lifting more than 5 pounds and never wash directly over incision. All education is complete and patient indicates understanding. ? ? ? ?Vision Baseline Vision/History: 0 No visual deficits ?   ?   ?Perception   ?  ?Praxis   ?  ? ?Pertinent Vitals/Pain Pain Assessment ?Pain Assessment: No/denies pain  ? ? ? ?Hand Dominance Right ?  ?Extremity/Trunk Assessment Upper Extremity Assessment ?Upper Extremity Assessment: Overall WFL for tasks assessed ?  ?Lower Extremity Assessment ?Lower Extremity Assessment: Overall WFL for tasks assessed ?  ?Cervical / Trunk Assessment ?Cervical / Trunk Assessment: Back Surgery ?  ?  Communication Communication ?Communication: No difficulties ?  ?Cognition Arousal/Alertness: Awake/alert ?Behavior During Therapy: [facility] for tasks assessed/performed ?Overall Cognitive Status: Within Functional Limits for tasks assessed ?  ?  ?  ?  ?  ?  ?  ?  ?  ?  ?  ?  ?  ?  ?  ?  ?  ?  ?  ?General Comments  incision dressed and dry with no drainage noted ? ?  ?Exercises   ?  ?Shoulder Instructions    ? ? ?Home Living Family/patient expects to be discharged to:: Private residence ?Living Arrangements:  Spouse/significant other ?Available Help at Discharge: Family;Available 24 hours/day ?Type of Home: House ?Home Access: Stairs to enter ?Entrance Stairs-Number of Steps: 5 ?Entrance Stairs-Rails: Right ?Home Layout: Two level;Bed/bath upstairs;Able to live on main level with bedroom/bathroom;Full bath on main level ?  ?  ?Bathroom Shower/Tub: Walk-in shower ?  ?Bathroom Toilet: Standard ?  ?  ?Home Equipment: None ?  ?Additional Comments: has 1 dog, wife has CA so brother in law and sister in law to (A) them ?  ? ?  ?Prior Functioning/Environment Prior Level of Function : Independent/Modified Independent;Driving ?  ?  ?  ?  ?  ?  ?  ?  ?  ? ?  ?  ?OT Problem List: Impaired balance (sitting and/or standing) ?  ?   ?OT Treatment/Interventions:    ?  ?OT Goals(Current goals can be found in the care plan section) Acute Rehab OT Goals ?Patient Stated Goal: to go home today ?OT Goal Formulation: With patient  ?OT Frequency:   ?  ? ?Co-evaluation   ?  ?  ?  ?  ? ?  ?AM-PAC OT "6 Clicks" Daily Activity     ?Outcome Measure Help from another person eating meals?: None ?Help from another person taking care of personal grooming?: None ?Help from another person toileting, which includes using toliet, bedpan, or urinal?: None ?Help from another person bathing (including washing, rinsing, drying)?: None ?Help from another person to put on and taking off regular upper body clothing?: None ?Help from another person to put on and taking off regular lower body clothing?: None ?6 Click Score: 24 ?  ?End of Session Equipment Utilized During Treatment: Gait belt;Back brace ?Nurse Communication: Mobility status;Precautions ? ?Activity Tolerance: Patient tolerated treatment well ?Patient left: in bed;with call bell/phone within reach ? ?OT Visit Diagnosis: Unsteadiness on feet (R26.81)  ?              ?Time: 2376-2831 ?OT Time Calculation (min): 29 min ?Charges:  OT General Charges ?$OT Visit: 1 Visit ?OT Evaluation ?$OT Eval Moderate  Complexity: 1 Mod ?OT Treatments ?$Self Care/Home Management : 8-22 mins ? ? ?Brynn, OTR/L  ?Acute Rehabilitation Services ?Office: [phone] ?. ? ? ?Mateo Flow ?11/03/2021, 4:41 PM ?

## 2021-11-03 NOTE — Progress Notes (Signed)
He looks really good postoperatively.  He has very little pain.  He may decide to go home tonight if he can urinate.  Foley catheter was just removed recently.  Good strength.  Walked well. ?

## 2021-11-03 NOTE — Progress Notes (Signed)
Patient awaiting transport via wheelchair by RN for discharge to home; in no acute distress nor complaints of pain nor discomfort; moves all extremities well; incision on his back with gauze dressing and is clean, dry and intact; room was checked for all his belongings and took along with him; discharge instructions concerning his medications, incision care, follow up appointment and when to call the doctor as needed were all discussed with patient by RN and he expressed understanding on the instructions given. ?

## 2021-11-06 ENCOUNTER — Ambulatory Visit: Payer: Medicare Other | Admitting: Cardiology

## 2021-11-15 NOTE — Progress Notes (Signed)
? ?Cardiology Office Note   ? ?Date:  11/22/2021  ? ?ID:  [name] [name], DOB January 25, 1946, MRN [id number] ? ? ?PCP:  [name] Noble, MD ?  ?[facility]  ?Cardiologist:  [name] Magic, MD   ?Advanced Practice Provider:  No care team member to display ?Electrophysiologist:  None  ? ?[id number]}  ? ?Chief Complaint  ?Patient presents with  ? Follow-up  ? ? ?History of Present Illness:  ?[name] [name] is a 76 y.o. male  with a hx of ASCAD.  He initially presented with chest pain and underwent myoview which was neg for ischemia but Chest pain continued. Cardiac CT showed coronary calcium score 1951 and severe > 70 % stenosis in prox LAD.  Possible significant disease in the ostial RCA and LCX.  It was felt that he could have  balanced ischemia on nuc.   Cath showed severe CAD and underwent CABG by Dr. Cornelius Moras 11/04/15 with LIMA to distal LAD, SVG to 1st OM of LCx, seq SVG to 2nd OM of LCX.  He had normal LV systolic function.   ? ?Called in 09/22/21 with some chest pain and HTN. Patient was sitting in his recliner and had a little chest pressure and tingling and it resolved in 5 min spontaneously. Occurred again a week later. BP has been running high. Going through [place] for disability and has seen 3 different doctors.. He started checking at home and running 143-156/70-80's. No further chest pain. Dr. Mayford Knife put him on protonix and no further chest pain. No regular exercise. Was golfing but hurt his back. Doing yard work without chest pain. Eating a lot of take out-all fast food, bbq and sandwiches. Wife has been ill with cancer.  ?  ? ?I saw the patient 10/10/21 and BP running high. No further chest pain. I increased amlodipine 7.5 mg daily. ? ?Patient comes in for f/u. Had back surgery 3 weeks ago and doing better. BP doing better. Has cut back on salt and lost 12 lbs. No chest pain, dyspnea, dizziness, edema.  ?  ? ? ?Past Medical History:  ?Diagnosis Date  ? BPH associated with nocturia   ? CAD (coronary  artery disease), native coronary artery   ? CARDIOLOGIST-  DR Gloris Manchester TURNER--  normal myoview 03/ 2017 but Cardiac CT 04/ 2017 calcium score 1951, >70% LAD stenosis possible disease RCA , LCX--- cath done showed severe 3V disease S/P  CABG 04/ 2017  ? Chronic back pain   ? herniated disc  ? CKD (chronic kidney disease), stage III (HCC)   ? Diverticulosis   ? GERD (gastroesophageal reflux disease)   ? Gout   ? as of 03-20-2017 per pt stable  ? History of kidney stones   ? METABOLIC STONE DISEASE  ? History of urinary retention   ? POST OP SURGERY 08/ 2013  ? Hypercalciuria   ? Hyperlipidemia   ? takes Crestor daily  ? Hypertension   ? LBBB (left bundle branch block)   ? Muscle cramps   ? legs occasonally in am  ? Renal calculus, right   ? Renal cyst, right   ? 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 LIMA to LAD, Sequential SVG to OM1-OM2, EVH via right thigh  ? Seasonal allergies   ? ? ?Past Surgical History:  ?Procedure Laterality Date  ? CARDIAC CATHETERIZATION N/A 10/28/2015  ? Procedure: Left Heart Cath and Coronary Angiography;  Surgeon: [name], MD;  Location: [facility] INVASIVE CV LAB;  Service: Cardiovascular;  Laterality: N/A;  ? CARDIAC CATHETERIZATION  05-21-1995  dr Verdis Prime  ? non-obstructive cad;  normal lvf  ? CARDIOVASCULAR STRESS TEST  09/23/2015  dr Gloris Manchester turner  ? Normal nuclear study w/ no prior infarct and no ischemia/  normal LV function and wall motion , nuclear stress ef 54%  ? COLONOSCOPY  last one 2009  ? CORONARY ARTERY BYPASS GRAFT N/A 11/04/2015  ? Procedure: CORONARY ARTERY BYPASS GRAFTING (CABG) times three using left internal mammary artery and right saphenous vein harvested with endoscope.;  Surgeon: [name], MD;  Location: [facility] OR;  Service: Open Heart Surgery;  Laterality: N/A;  ? CYSTOSCOPY WITH RETROGRADE PYELOGRAM, URETEROSCOPY AND STENT PLACEMENT Left 03/09/2017  ? Procedure: CYSTOSCOPY WITH RETROGRADE PYELOGRAM, URETEROSCOPY AND STENT PLACEMENT;  Surgeon: Sebastian Ache, MD;  Location:  [facility] ORS;  Service: Urology;  Laterality: Left;  ? CYSTOSCOPY WITH URETEROSCOPY  07/28/2012  ? Procedure: CYSTOSCOPY WITH URETEROSCOPY;  Surgeon: [name], MD;  Location: [facility];  Service: Urology;  Laterality: Left;   ?  ? CYSTOSCOPY/URETEROSCOPY/HOLMIUM LASER/STENT PLACEMENT Right 03/25/2017  ? Procedure: CYSTOSCOPY RIGHT URETEROSCOPYVHOLMIUM LASER STENT PLACEMENT;  Surgeon: Crista Elliot, MD;  Location: [facility];  Service: Urology;  Laterality: Right;  ? ESOPHAGOGASTRODUODENOSCOPY (EGD) WITH ESOPHAGEAL DILATION  05/ 2018 approx.  ? EXTRACORPOREAL SHOCK WAVE LITHOTRIPSY  multiple last one 05-29-2012  ? FOOT SURGERY  1990s  ? right-removed bone spur   ? HOLMIUM LASER APPLICATION  07/28/2012  ? Procedure: HOLMIUM LASER APPLICATION;  Surgeon: [name], MD;  Location: [facility];  Service: Urology;  Laterality: Left;  left lithotripsy  ? HOLMIUM LASER APPLICATION Left 03/09/2017  ? Procedure: HOLMIUM LASER APPLICATION;  Surgeon: Sebastian Ache, MD;  Location: [facility];  Service: Urology;  Laterality: Left;  ? LAPAROSCOPIC APPENDECTOMY N/A 12/20/2018  ? Procedure: APPENDECTOMY LAPAROSCOPIC;  Surgeon: Axel Filler, MD;  Location: [facility];  Service: General;  Laterality: N/A;  ? POSTERIOR LUMBAR FUSION  03/07/2012  ? w/ laminecotmy L4-5  ? PYELOLITHOTOMY Right 01/1982  ? TEE WITHOUT CARDIOVERSION N/A 11/04/2015  ? Procedure: TRANSESOPHAGEAL ECHOCARDIOGRAM (TEE);  Surgeon: [name], MD;  Location: [facility] OR;  Service: Open Heart Surgery;  Laterality: N/A;  ? TONSILLECTOMY  age 65  ? URETEROLITHOTOMY Right 03/1994  ? ? ?Current Medications: ?Current Meds  ?Medication Sig  ? allopurinol (ZYLOPRIM) 100 MG tablet Take 100 mg by mouth 2 (two) times daily.   ? amLODipine (NORVASC) 5 MG tablet Take 1.5 tablets (7.5 mg total) by mouth daily.  ? aspirin EC 81 MG EC tablet Take 1 tablet (81 mg total) by mouth daily.  ? cholecalciferol (VITAMIN D) 1000 UNITS tablet Take 1,000 Units by mouth every morning.   ?  Coenzyme Q10 (COQ10) 100 MG CAPS Take 100 mg by mouth every evening.  ? Evolocumab (REPATHA SURECLICK) 140 MG/ML SOAJ Inject 1 pen into the skin every 14 (fourteen) days.  ? fexofenadine (ALLEGRA) 180 MG tablet Take 180 mg by mouth daily.  ? icosapent Ethyl (VASCEPA) 1 g capsule TAKE 2 CAPSULES(2 GRAMS) BY MOUTH TWICE DAILY  ? metoprolol tartrate (LOPRESSOR) 25 MG tablet TAKE 1 TABLET(25 MG) BY MOUTH TWICE DAILY  ? Multiple Vitamin (MULTIVITAMIN WITH MINERALS) TABS Take 1 tablet by mouth every morning.   ? pantoprazole (PROTONIX) 40 MG tablet Take 1 tablet (40 mg total) by mouth 2 (two) times daily.  ? pravastatin (PRAVACHOL) 80 MG tablet Take 1 tablet (80 mg total) by mouth  every evening.  ? telmisartan (MICARDIS) 20 MG tablet Take 1 tablet (20 mg total) by mouth every morning.  ?  ? ?Allergies:   Atorvastatin, Hydrochlorothiazide, and Rosuvastatin  ? ?Social History  ? ?Socioeconomic History  ? Marital status: Married  ?  Spouse name: Not on file  ? Number of children: Not on file  ? Years of education: Not on file  ? Highest education level: Not on file  ?Occupational History  ? Not on file  ?Tobacco Use  ? Smoking status: Never  ? Smokeless tobacco: Never  ?Vaping Use  ? Vaping Use: Never used  ?Substance and Sexual Activity  ? Alcohol use: No  ?  Alcohol/week: 0.0 standard drinks  ? Drug use: No  ? Sexual activity: Yes  ?  Comment: vasectomy 1980s  ?Other Topics Concern  ? Not on file  ?Social History Narrative  ? Not on file  ? ?Social Determinants of Health  ? ?Financial Resource Strain: Not on file  ?Food Insecurity: Not on file  ?Transportation Needs: Not on file  ?Physical Activity: Not on file  ?Stress: Not on file  ?Social Connections: Not on file  ?  ? ?Family History:  The patient's  family history includes Dementia in his mother; Prostate cancer in his father.  ? ?ROS:   ?Please see the history of present illness.    ?ROS All other systems reviewed and are negative. ? ? ?PHYSICAL EXAM:   ?VS:  BP  110/60   Pulse 83   Ht  (1.676 m)   Wt 210 lb (95.3 kg)   SpO2 97%   BMI 33.89 kg/m?   ?Physical Exam  ?GEN: Obese, in no acute distress  ?Neck: no JVD, carotid bruits, or masses ?Cardiac:RRR; no murmurs,

## 2021-11-22 ENCOUNTER — Encounter: Payer: Self-pay | Admitting: Physician Assistant

## 2021-11-22 ENCOUNTER — Ambulatory Visit (INDEPENDENT_AMBULATORY_CARE_PROVIDER_SITE_OTHER): Payer: Medicare Other | Admitting: Physician Assistant

## 2021-11-22 VITALS — BP 110/60 | HR 83 | Ht 66.0 in | Wt 210.0 lb

## 2021-11-22 DIAGNOSIS — E785 Hyperlipidemia, unspecified: Secondary | ICD-10-CM

## 2021-11-22 DIAGNOSIS — E66812 Obesity, class 2: Secondary | ICD-10-CM

## 2021-11-22 DIAGNOSIS — I10 Essential (primary) hypertension: Secondary | ICD-10-CM | POA: Diagnosis not present

## 2021-11-22 DIAGNOSIS — I25810 Atherosclerosis of coronary artery bypass graft(s) without angina pectoris: Secondary | ICD-10-CM

## 2021-11-22 DIAGNOSIS — Z6835 Body mass index (BMI) 35.0-35.9, adult: Secondary | ICD-10-CM

## 2021-11-22 NOTE — Patient Instructions (Signed)
Medication Instructions:  ?Your physician recommends that you continue on your current medications as directed. Please refer to the Current Medication list given to you today. ?*If you need a refill on your cardiac medications before your next appointment, please call your pharmacy* ? ? ?Lab Work: ?NONE ORDERED ? ? ?Testing/Procedures: ?NONE ORDERED ? ? ?Follow-Up: ?At [facility], you and your health needs are our priority.  As part of our continuing mission to provide you with exceptional heart care, we have created designated Provider Care Teams.  These Care Teams include your primary Cardiologist (physician) and Advanced Practice Providers (APPs -  Physician Assistants and Nurse Practitioners) who all work together to provide you with the care you need, when you need it. ? ?We recommend signing up for the patient portal called "MyChart".  Sign up information is provided on this After Visit Summary.  MyChart is used to connect with patients for Virtual Visits (Telemedicine).  Patients are able to view lab/test results, encounter notes, upcoming appointments, etc.  Non-urgent messages can be sent to your provider as well.   ?To learn more about what you can do with MyChart, go to [email or web address].   ? ?Your next appointment:   ?6 month(s) ? ?The format for your next appointment:   ?In Person ? ?Provider:   ?Armanda Magic, MD   ? ? ?Other Instructions ? ? ?Important Information About Sugar ? ? ? ? ?  ?

## 2022-04-27 ENCOUNTER — Other Ambulatory Visit: Payer: Self-pay | Admitting: Cardiology

## 2022-04-27 DIAGNOSIS — E785 Hyperlipidemia, unspecified: Secondary | ICD-10-CM

## 2022-04-27 DIAGNOSIS — I2510 Atherosclerotic heart disease of native coronary artery without angina pectoris: Secondary | ICD-10-CM

## 2022-04-27 DIAGNOSIS — I10 Essential (primary) hypertension: Secondary | ICD-10-CM

## 2022-04-27 DIAGNOSIS — Z951 Presence of aortocoronary bypass graft: Secondary | ICD-10-CM

## 2022-04-30 ENCOUNTER — Encounter (HOSPITAL_BASED_OUTPATIENT_CLINIC_OR_DEPARTMENT_OTHER): Payer: Self-pay | Admitting: Urology

## 2022-04-30 ENCOUNTER — Other Ambulatory Visit: Payer: Self-pay | Admitting: Urology

## 2022-04-30 NOTE — Progress Notes (Signed)
Pre-op phone call complete. Procedure date and arrival time confirmed. Patient allergies, medical history, and medications verified. Patient advised to stop Aspirin 04/30/2022, vitamins, and CoQ10. Patient can take blood pressure medications day of procedure. Patient can have clear liquids until 0700. Driver secured.

## 2022-05-01 ENCOUNTER — Encounter (HOSPITAL_BASED_OUTPATIENT_CLINIC_OR_DEPARTMENT_OTHER): Payer: Self-pay | Admitting: Urology

## 2022-05-01 ENCOUNTER — Other Ambulatory Visit: Payer: Self-pay | Admitting: Urology

## 2022-05-01 NOTE — Progress Notes (Addendum)
Spoke w/ via phone for pre-op interview--- pt Lab needs dos----   [employer] results------ current ekg in epic/ chart COVID test -----patient states asymptomatic no test needed Arrive at ------- 0630 on 05-02-2022 NPO after MN NO Solid  Food.  Clear liquids from MN until--- 0530 Med rec completed Medications to take morning of surgery ----- allegra, lopressor, norvasc, allopurinol, protonix Diabetic medication ----- n/a Patient instructed no nail polish to be worn day of surgery Patient instructed to bring photo id and insurance card day of surgery Patient aware to have Driver (ride )-- brother-n-law / caregiver for 24 hours after surgery -- wife, becky Patient Special Instructions ----- n/a Pre-Op special Istructions ----- cased posted today, orders pending second sign Patient verbalized understanding of instructions that were given at this phone interview. Patient denies shortness of breath, chest pain, fever, cough at this phone interview.

## 2022-05-02 ENCOUNTER — Encounter (HOSPITAL_BASED_OUTPATIENT_CLINIC_OR_DEPARTMENT_OTHER)
Admission: RE | Disposition: A | Discharge status: Home / Self Care | Payer: Self-pay | Source: Ambulatory Visit | Attending: Urology

## 2022-05-02 ENCOUNTER — Ambulatory Visit (HOSPITAL_BASED_OUTPATIENT_CLINIC_OR_DEPARTMENT_OTHER): Payer: Medicare Other | Admitting: Anesthesiology

## 2022-05-02 ENCOUNTER — Ambulatory Visit (HOSPITAL_BASED_OUTPATIENT_CLINIC_OR_DEPARTMENT_OTHER)
Admission: RE | Admit: 2022-05-02 | Discharge: 2022-05-02 | Disposition: A | Discharge status: Home / Self Care | Payer: Medicare Other | Source: Ambulatory Visit | Attending: Urology | Admitting: Urology

## 2022-05-02 ENCOUNTER — Encounter (HOSPITAL_BASED_OUTPATIENT_CLINIC_OR_DEPARTMENT_OTHER): Payer: Self-pay | Admitting: Urology

## 2022-05-02 DIAGNOSIS — R351 Nocturia: Secondary | ICD-10-CM | POA: Diagnosis not present

## 2022-05-02 DIAGNOSIS — Z79899 Other long term (current) drug therapy: Secondary | ICD-10-CM | POA: Insufficient documentation

## 2022-05-02 DIAGNOSIS — E669 Obesity, unspecified: Secondary | ICD-10-CM | POA: Diagnosis not present

## 2022-05-02 DIAGNOSIS — I10 Essential (primary) hypertension: Secondary | ICD-10-CM

## 2022-05-02 DIAGNOSIS — N202 Calculus of kidney with calculus of ureter: Secondary | ICD-10-CM | POA: Diagnosis present

## 2022-05-02 DIAGNOSIS — Z951 Presence of aortocoronary bypass graft: Secondary | ICD-10-CM | POA: Insufficient documentation

## 2022-05-02 DIAGNOSIS — K219 Gastro-esophageal reflux disease without esophagitis: Secondary | ICD-10-CM | POA: Diagnosis not present

## 2022-05-02 DIAGNOSIS — N210 Calculus in bladder: Secondary | ICD-10-CM | POA: Diagnosis not present

## 2022-05-02 DIAGNOSIS — N401 Enlarged prostate with lower urinary tract symptoms: Secondary | ICD-10-CM | POA: Insufficient documentation

## 2022-05-02 DIAGNOSIS — N179 Acute kidney failure, unspecified: Secondary | ICD-10-CM | POA: Diagnosis not present

## 2022-05-02 DIAGNOSIS — Z6833 Body mass index (BMI) 33.0-33.9, adult: Secondary | ICD-10-CM | POA: Insufficient documentation

## 2022-05-02 DIAGNOSIS — Z01818 Encounter for other preprocedural examination: Secondary | ICD-10-CM

## 2022-05-02 DIAGNOSIS — I2510 Atherosclerotic heart disease of native coronary artery without angina pectoris: Secondary | ICD-10-CM

## 2022-05-02 HISTORY — PX: CYSTOSCOPY W/ URETERAL STENT PLACEMENT: SHX1429

## 2022-05-02 LAB — POCT I-STAT, CHEM 8
BUN: 27 mg/dL — ABNORMAL HIGH (ref 8–23)
Calcium, Ion: 1.27 mmol/L (ref 1.15–1.40)
Chloride: 107 mmol/L (ref 98–111)
Creatinine, Ser: 2.5 mg/dL — ABNORMAL HIGH (ref 0.61–1.24)
Glucose, Bld: 107 mg/dL — ABNORMAL HIGH (ref 70–99)
HCT: 43 % (ref 39.0–52.0)
Hemoglobin: 14.6 g/dL (ref 13.0–17.0)
Potassium: 3.7 mmol/L (ref 3.5–5.1)
Sodium: 143 mmol/L (ref 135–145)
TCO2: 25 mmol/L (ref 22–32)

## 2022-05-02 SURGERY — CYSTOSCOPY, WITH RETROGRADE PYELOGRAM AND URETERAL STENT INSERTION
Anesthesia: General | Site: Renal | Laterality: Right

## 2022-05-02 MED ORDER — CEFAZOLIN SODIUM-DEXTROSE 2-4 GM/100ML-% IV SOLN
INTRAVENOUS | Status: AC
  Filled 2022-05-02: qty 100

## 2022-05-02 MED ORDER — LACTATED RINGERS IV SOLN: INTRAVENOUS | Status: DC

## 2022-05-02 MED ORDER — LIDOCAINE HCL (CARDIAC) PF 100 MG/5ML IV SOSY
PREFILLED_SYRINGE | INTRAVENOUS | Status: DC | PRN
  Administered 2022-05-02: 60 mg via INTRAVENOUS

## 2022-05-02 MED ORDER — ACETAMINOPHEN 500 MG PO TABS
1000.0000 mg | ORAL_TABLET | Freq: Once | ORAL | Status: AC
  Administered 2022-05-02: 1000 mg via ORAL

## 2022-05-02 MED ORDER — ONDANSETRON HCL 4 MG/2ML IJ SOLN: 4.0000 mg | Freq: Once | INTRAMUSCULAR | Status: DC | PRN

## 2022-05-02 MED ORDER — DEXAMETHASONE SODIUM PHOSPHATE 4 MG/ML IJ SOLN
INTRAMUSCULAR | Status: DC | PRN
  Administered 2022-05-02: 4 mg via INTRAVENOUS

## 2022-05-02 MED ORDER — KETOROLAC TROMETHAMINE 30 MG/ML IJ SOLN
INTRAMUSCULAR | Status: AC
  Filled 2022-05-02: qty 1

## 2022-05-02 MED ORDER — EPHEDRINE 5 MG/ML INJ
INTRAVENOUS | Status: AC
  Filled 2022-05-02: qty 15

## 2022-05-02 MED ORDER — OXYBUTYNIN CHLORIDE 5 MG PO TABS: 5.0000 mg | ORAL_TABLET | Freq: Three times a day (TID) | ORAL | 1 refills | Status: DC | PRN

## 2022-05-02 MED ORDER — PHENYLEPHRINE HCL (PRESSORS) 10 MG/ML IV SOLN
INTRAVENOUS | Status: DC | PRN
  Administered 2022-05-02 (×4): 80 ug via INTRAVENOUS

## 2022-05-02 MED ORDER — PROPOFOL 10 MG/ML IV BOLUS
INTRAVENOUS | Status: AC
  Filled 2022-05-02: qty 20

## 2022-05-02 MED ORDER — PHENAZOPYRIDINE HCL 200 MG PO TABS: 200.0000 mg | ORAL_TABLET | Freq: Three times a day (TID) | ORAL | 0 refills | Status: DC | PRN

## 2022-05-02 MED ORDER — EPHEDRINE SULFATE (PRESSORS) 50 MG/ML IJ SOLN
INTRAMUSCULAR | Status: DC | PRN
  Administered 2022-05-02 (×4): 5 mg via INTRAVENOUS

## 2022-05-02 MED ORDER — ONDANSETRON HCL 4 MG/2ML IJ SOLN
INTRAMUSCULAR | Status: DC | PRN
  Administered 2022-05-02: 4 mg via INTRAVENOUS

## 2022-05-02 MED ORDER — IOHEXOL 300 MG/ML  SOLN
INTRAMUSCULAR | Status: DC | PRN
  Administered 2022-05-02: 6 mL via URETHRAL

## 2022-05-02 MED ORDER — MIDAZOLAM HCL 2 MG/2ML IJ SOLN
INTRAMUSCULAR | Status: AC
  Filled 2022-05-02: qty 2

## 2022-05-02 MED ORDER — 0.9 % SODIUM CHLORIDE (POUR BTL) OPTIME
TOPICAL | Status: DC | PRN
  Administered 2022-05-02: 500 mL

## 2022-05-02 MED ORDER — SODIUM CHLORIDE 0.9 % IR SOLN
Status: DC | PRN
  Administered 2022-05-02: 3000 mL

## 2022-05-02 MED ORDER — FENTANYL CITRATE (PF) 100 MCG/2ML IJ SOLN
INTRAMUSCULAR | Status: AC
  Filled 2022-05-02: qty 2

## 2022-05-02 MED ORDER — SODIUM CHLORIDE 0.9 % IV SOLN: INTRAVENOUS | Status: DC

## 2022-05-02 MED ORDER — ACETAMINOPHEN 500 MG PO TABS
ORAL_TABLET | ORAL | Status: AC
  Filled 2022-05-02: qty 2

## 2022-05-02 MED ORDER — PROPOFOL 10 MG/ML IV BOLUS
INTRAVENOUS | Status: DC | PRN
  Administered 2022-05-02: 140 mg via INTRAVENOUS

## 2022-05-02 MED ORDER — PHENYLEPHRINE 80 MCG/ML (10ML) SYRINGE FOR IV PUSH (FOR BLOOD PRESSURE SUPPORT)
PREFILLED_SYRINGE | INTRAVENOUS | Status: AC
  Filled 2022-05-02: qty 40

## 2022-05-02 MED ORDER — FENTANYL CITRATE (PF) 100 MCG/2ML IJ SOLN: 25.0000 ug | INTRAMUSCULAR | Status: DC | PRN

## 2022-05-02 MED ORDER — FENTANYL CITRATE (PF) 100 MCG/2ML IJ SOLN
INTRAMUSCULAR | Status: DC | PRN
  Administered 2022-05-02: 50 ug via INTRAVENOUS

## 2022-05-02 MED ORDER — CEFAZOLIN (ANCEF) 1 G IV SOLR
2.0000 g | INTRAVENOUS | Status: AC
  Administered 2022-05-02: 2 g
  Filled 2022-05-02: qty 2

## 2022-05-02 SURGICAL SUPPLY — 24 items
BAG DRAIN URO-CYSTO SKYTR STRL (DRAIN) ×1 IMPLANT
BASKET STONE 1.7 NGAGE (UROLOGICAL SUPPLIES) IMPLANT
BASKET ZERO TIP NITINOL 2.4FR (BASKET) ×1 IMPLANT
BENZOIN TINCTURE PRP APPL 2/3 (GAUZE/BANDAGES/DRESSINGS) IMPLANT
CATH URETL OPEN 5X70 (CATHETERS) IMPLANT
CLOTH BEACON ORANGE TIMEOUT ST (SAFETY) ×1 IMPLANT
FIBER LASER FLEXIVA 365 (UROLOGICAL SUPPLIES) IMPLANT
GLOVE BIO SURGEON STRL SZ7.5 (GLOVE) ×1 IMPLANT
GOWN STRL REUS W/TWL XL LVL3 (GOWN DISPOSABLE) ×1 IMPLANT
GUIDEWIRE STR DUAL SENSOR (WIRE) IMPLANT
GUIDEWIRE ZIPWRE .038 STRAIGHT (WIRE) ×1 IMPLANT
IV NS IRRIG 3000ML ARTHROMATIC (IV SOLUTION) ×2 IMPLANT
KIT TURNOVER CYSTO (KITS) ×1 IMPLANT
MANIFOLD NEPTUNE II (INSTRUMENTS) ×1 IMPLANT
NS IRRIG 500ML POUR BTL (IV SOLUTION) ×1 IMPLANT
PACK CYSTO (CUSTOM PROCEDURE TRAY) ×1 IMPLANT
SOL PREP POV-IOD 4OZ 10% (MISCELLANEOUS) IMPLANT
STENT URET 6FRX24 CONTOUR (STENTS) IMPLANT
STRIP CLOSURE SKIN 1/2X4 (GAUZE/BANDAGES/DRESSINGS) IMPLANT
SYR 10ML LL (SYRINGE) ×1 IMPLANT
TRACTIP FLEXIVA PULS ID 200XHI (Laser) IMPLANT
TRACTIP FLEXIVA PULSE ID 200 (Laser) ×1
TUBE CONNECTING 12X1/4 (SUCTIONS) IMPLANT
TUBING UROLOGY SET (TUBING) ×1 IMPLANT

## 2022-05-02 NOTE — Transfer of Care (Signed)
Immediate Anesthesia Transfer of Care Note  Patient: [name] [name]  Procedure(s) Performed: CYSTOSCOPY WITH RETROGRADE PYELOGRAM/URETERAL STENT PLACEMENT (Right: Renal)  Patient Location: PACU  Anesthesia Type:General  Level of Consciousness: drowsy and patient cooperative  Airway & Oxygen Therapy: Patient Spontanous Breathing and Patient connected to nasal cannula oxygen  Post-op Assessment: Report given to RN and Post -op Vital signs reviewed and stable  Post vital signs: Reviewed and stable  Last Vitals:  Vitals Value Taken Time  BP 122/70 05/02/22 0921  Temp    Pulse 90 05/02/22 0922  Resp 20 05/02/22 0922  SpO2 96 % 05/02/22 0922  Vitals shown include unvalidated device data.  Last Pain:  Vitals:   05/02/22 0650  TempSrc: Oral  PainSc: 0-No pain      Patients Stated Pain Goal: 5 (00/86/76 1950)  Complications: No notable events documented.

## 2022-05-02 NOTE — Anesthesia Postprocedure Evaluation (Signed)
Anesthesia Post Note  Patient: [name] [name]  Procedure(s) Performed: CYSTOSCOPY WITH RETROGRADE PYELOGRAM/URETERAL STENT PLACEMENT (Right: Renal)     Patient location during evaluation: PACU Anesthesia Type: General Level of consciousness: awake and alert Pain management: pain level controlled Vital Signs Assessment: post-procedure vital signs reviewed and stable Respiratory status: spontaneous breathing, nonlabored ventilation, respiratory function stable and patient connected to nasal cannula oxygen Cardiovascular status: blood pressure returned to baseline and stable Postop Assessment: no apparent nausea or vomiting Anesthetic complications: no   No notable events documented.  Last Vitals:  Vitals:   05/02/22 0957 05/02/22 1015  BP: 121/73 (!) 144/83  Pulse: 80 83  Resp: 13 14  Temp:  36.4 C  SpO2: 95% 100%    Last Pain:  Vitals:   05/02/22 0957  TempSrc:   PainSc: 0-No pain                 Santa Lighter

## 2022-05-02 NOTE — Anesthesia Procedure Notes (Signed)
Procedure Name: LMA Insertion Date/Time: 05/02/2022 8:39 AM  Performed by: Georgeanne Nim, CRNAPre-anesthesia Checklist: Patient identified, Emergency Drugs available, Suction available, Patient being monitored and Timeout performed Patient Re-evaluated:Patient Re-evaluated prior to induction Oxygen Delivery Method: Circle system utilized Preoxygenation: Pre-oxygenation with 100% oxygen Induction Type: IV induction Ventilation: Mask ventilation without difficulty LMA: LMA inserted LMA Size: 3.0 Number of attempts: 1 Placement Confirmation: positive ETCO2, CO2 detector and breath sounds checked- equal and bilateral Tube secured with: Tape Dental Injury: Teeth and Oropharynx as per pre-operative assessment

## 2022-05-02 NOTE — Discharge Instructions (Signed)
  Post Anesthesia Home Care Instructions  Activity: Get plenty of rest for the remainder of the day. A responsible individual must stay with you for 24 hours following the procedure.  For the next 24 hours, DO NOT: -Drive a car -Operate machinery -Drink alcoholic beverages -Take any medication unless instructed by your physician -Make any legal decisions or sign important papers.  Meals: Start with liquid foods such as gelatin or soup. Progress to regular foods as tolerated. Avoid greasy, spicy, heavy foods. If nausea and/or vomiting occur, drink only clear liquids until the nausea and/or vomiting subsides. Call your physician if vomiting continues.  Special Instructions/Symptoms: Your throat may feel dry or sore from the anesthesia or the breathing tube placed in your throat during surgery. If this causes discomfort, gargle with warm salt water. The discomfort should disappear within 24 hours.  No acetaminophen/Tylenol until after 1 pm today if needed.      

## 2022-05-02 NOTE — Anesthesia Preprocedure Evaluation (Addendum)
Anesthesia Evaluation  Patient identified by MRN, date of birth, ID band Patient awake    Reviewed: Allergy & Precautions, NPO status , Patient's Chart, lab work & pertinent test results, reviewed documented beta blocker date and time   Airway Mallampati: III  TM Distance: >3 FB Neck ROM: Full    Dental  (+) Teeth Intact, Dental Advisory Given   Pulmonary neg pulmonary ROS,    Pulmonary exam normal breath sounds clear to auscultation       Cardiovascular hypertension, Pt. on medications and Pt. on home beta blockers + CAD and + CABG  Normal cardiovascular exam+ dysrhythmias (LBBB)  Rhythm:Regular Rate:Normal     Neuro/Psych negative neurological ROS     GI/Hepatic Neg liver ROS, GERD  Medicated,  Endo/Other  Obesity   Renal/GU Renal InsufficiencyRenal disease     Musculoskeletal negative musculoskeletal ROS (+)   Abdominal   Peds  Hematology negative hematology ROS (+)   Anesthesia Other Findings Day of surgery medications reviewed with the patient.  Reproductive/Obstetrics                            Anesthesia Physical Anesthesia Plan  ASA: 3  Anesthesia Plan: General   Post-op Pain Management: Tylenol PO (pre-op)*   Induction: Intravenous  PONV Risk Score and Plan: 2 and Dexamethasone and Ondansetron  Airway Management Planned: LMA  Additional Equipment:   Intra-op Plan:   Post-operative Plan:   Informed Consent: I have reviewed the patients History and Physical, chart, labs and discussed the procedure including the risks, benefits and alternatives for the proposed anesthesia with the patient or authorized representative who has indicated his/her understanding and acceptance.     Dental advisory given  Plan Discussed with: CRNA, Anesthesiologist and Surgeon  Anesthesia Plan Comments:         Anesthesia Quick Evaluation

## 2022-05-02 NOTE — Op Note (Signed)
Operative Note  Preoperative diagnosis:  1.  7 mm right distal ureteral stone 2.  Multiple right renal stones  Postoperative diagnosis: 1.  Passed right ureteral stone 2.  7 mm bladder stone 3.  Multiple right renal stones  Procedure(s): 1.  Cystoscopy with right ureteroscopy, holmium laser lithotripsy and right JJ stent placement 2.  Right retrograde pyelogram with intraoperative interpretation of fluoroscopic imaging  Surgeon: Ellison Hughs, MD  Assistants:  None  Anesthesia:  General  Complications:  None  EBL: Less than 5 mL  Specimens: 1.  Bladder stone  Drains/Catheters: 1.  Right 6 French, 24 cm JJ stent without tether  Intraoperative findings:   7 mm bladder stone with no other intravesical abnormalities Solitary right collecting system with no filling defects or dilation involving the right ureter or right renal pelvis seen on retrograde pyelogram Multiple 4 to 5 mm right renal stones that dusted easily during laser lithotripsy  Indication:  [name] [name] is a 76 y.o. male seen in the office on 04/30/2022 due to acute right-sided flank pain.  He was found to have an obstructing 7 mm right distal ureteral calculus.  He was also found to be in acute renal failure with a serum creatinine of 2.4.  He has been consented for the above procedures, voices understanding and wishes to proceed.  Description of procedure:  After informed consent was obtained, the patient was brought to the operating room and general LMA anesthesia was administered. The patient was then placed in the dorsolithotomy position and prepped and draped in the usual sterile fashion. A timeout was performed. A 23 French rigid cystoscope was then inserted into the urethral meatus and advanced into the bladder under direct vision. A complete bladder survey revealed a 7 mm bladder stone with no other intravesical abnormalities.  The stone was siphoned out of the bladder through the sheath of the  cystoscope.  A 5 French ureteral catheter was then inserted into the right ureteral orifice and a retrograde pyelogram was obtained, with the findings listed above.  A Glidewire was then used to intubate the lumen of the ureteral catheter and was advanced up to the right renal pelvis, under fluoroscopic guidance.  The catheter was then removed, leaving the wire in place.  A flexible ureteroscope was then advanced alongside the wire and up the right ureter where no obstructing stone was identified.  There was a mild degree of erythema within the distal aspects of the right ureter, but no other abnormalities.  The flexible ureteroscope was then advanced up to the right renal pelvis where multiple 4 to 5 mm stones in multiple calyces were identified.  The 200 m holmium laser was then used to dust all identifiable stones into 2 mm or less fragments.  The flexible ureteroscope was then removed, leaving the wire in place.  A 6 French, 24 cm JJ stent was then advanced over the wire and into good position within the right collecting system, confirming placement via fluoroscopy.  The patient's bladder was drained.  He tolerated the procedure well and was transferred to the postanesthesia in stable condition.  Plan: Follow-up in 1 week for office cystoscopy, stent removal and repeat BMP.  He will need to follow-up with Dr. Gloriann Loan in 6 weeks with a right renal ultrasound.

## 2022-05-02 NOTE — H&P (Signed)
Office Visit Report     04/30/2022   --------------------------------------------------------------------------------   Bebe Liter  MRN: [id number]  DOB: 04-25-1946, 76 year old Male   PRIMARY CARE:  R Robley Fries, MD  REFERRING:  Gwynneth Macleod, NP  PROVIDER:  Modena Slater, Radene Knee, M.D.  TREATING:  Rhoderick Moody, M.D.  LOCATION:  [facility] [phone]     --------------------------------------------------------------------------------   CC/HPI: 02/09/2020: Seen in the past for evaluation of obstructing kidney stone disease as well as BPH with lower urinary tract symptoms. Previously taking tamsulosin, Myrbetriq, as well as finasteride. Finasteride was discontinued at time of last office visit evaluation. Sometime over the past 1-2 years his primary care provider stopped Myrbetriq but patient can't remember why this was discontinued. He did site him having a reaction to the medication. He continues tamsulosin as monotherapy alone taken at night. He denies any interval stone material passage or unilateral pain/discomfort suggestive of obstructive stone disease. He denies any interval burning or painful urination, visible blood in the urine.   Over the past 8 months he has had worsening nocturia getting up at least every hour. He will have some associated urgency but far less intermittent during the day. He states sometimes urgency is severe enough to the point where he will almost leak before reaching the toilet. Typically does not have trouble starting or stopping his stream. He has some occasional hesitancy but this is not bothersome to him. He tries to limit fluids prior to bedtime. He denies constipation. He has been evaluated for sleep apnea but told this was not severe enough for him to require the use of a CPAP machine.   03/10/2020  76 year old male was recently placed on solifenacin for nocturia. He has a history of BPH. He continues to have mildly weak stream,  frequency, sensation of incomplete emptying and nocturia greater than 5 times a night. This is despite Flomax and solifenacin. He is interested in Nurse, learning disability.   04/07/2020: Here today for TOV after REZUM earlier this week with Dr Alvester Morin. He has tolerated catheter well. He has not had any significant gross hematuria or painful urgency, denies leaking around Catheter tubing. Urine output has been appropriate by his report. He denies postprocedure fevers or chills, nausea/vomiting. He does remain on tamsulosin as well as solifenacin.   04/29/2020: Successful trial of void at time of last office visit. I wanted him to discontinue VESIcare within 1-2 weeks after time of last visit. Presents today with painful inability to void.  Symptoms began yesterday with increased urinary urgency and associated weakening of urine stream. Also with significant hesitancy/straining. Complaining of some mild hematuria as well as some intermittent burning but this is not severe. Prior to this he was noted some progressive improvement in baseline voiding symptoms including decreased nocturia and significant improvement with force of stream. He notes some increased constipation over the past week. Unfortunately he is not discontinued VESIcare at this time but does continue tamsulosin as directed. Symptoms not associated with any new or worsening lower back or flank pain/discomfort. He denies fever/chills or nausea/vomiting.   05/03/2020: Patient underwent Rezum 1 month ago. And passed a voiding trial a few days later. Unfortunately, a few weeks after that he developed urinary retention associated with a UTI. He had also been continuing VESIcare. At last office visit Foley catheter was placed and VESIcare was discontinued. He was also placed on Cipro for presumed UTI. However his culture was negative. He presents today for a trial  of void. He denies fevers, flank pain, gross hematuria. He has tolerated the catheter well. He  has continued tamsulosin but remains off VESIcare. He denies constipation.   05/12/2020: Returns today for previously scheduled follow-up exam. Voiding trial successful at time of last office visit. He remains off of VESIcare, continues tamsulosin. Patient doing quite well today. He endorses very strong force of stream, no sensation of incomplete bladder emptying. Since catheter removal he has had no recurrence of burning or painful urination, visible blood in the urine. Symptom score sheet today is 4/1. PVR is 0 mL.   08/11/2020  Patient is off all prostate medication. He has nocturia 1-2 times a night. Otherwise he is voiding very well. He states he is voiding better than since he was 16. He does have some erectile dysfunction. He has a difficult time obtaining and maintaining an erection. He does not take nitrates for chest pain. He has never tried Viagra or Cialis. He would like to try Cialis   04/18/2021  Patient is doing well in regards to his history of BPH. Comes in with a 1 day history of severe left-sided flank pain. He had a oxycodone left over from his prior surgery but is now out. This helped his pain. He has some persistent microscopic hematuria today. Pain has improved. Did not obviously see a stone passed. Last imaging on 12/20/2018 showed scattered bilateral tiny 1-2 mm renal calculi. He denies any fever, chill, nausea, vomiting.   05/08/2021: Imaging obtained after time of last office visit showed bilateral nonobstructing calculi based on my independent review of KUB and renal ultrasound. There was no obvious obstructive signs in each kidney. Reviewing his KUB, there is a suspicious opacity measuring around 3 to 4 mm in the anatomical expected tract of the distal left ureter just lateral to the mid left sacral wing but without recent comparison imaging this very well may be a pelvic phlebolith as he does have numerous scattered throughout the pelvic inlet. Today back for follow-up exam,  microscopic hematuria continues on urinalysis. Fortunately the patient has been asymptomatic since time of last office visit. He did have some pain and discomfort with voiding as well as passage of hematuria but did not see any obvious stone material passed. His left-sided pain and discomfort have resolved. He is back to baseline voiding symptoms with stable, grossly nonbothersome symptomology. Denies bothersome increase in frequency/urgency, changes in force of stream. He denies interval fevers or chills, nausea/vomiting.   08/14/2021: CT imaging obtained after last office visit in late October showed a minimally obstructing distal left ureteral calculus. He did not follow-up as requested for repeat exam where a KUB for comparison was to be performed.   Today he tells me the reason why he did not follow-up after CT imaging was because he passed a stone shortly after the imaging was performed. After that occurred, he did not have any additional recurrence of pain/discomfort suggestive of obstructive uropathy. He has not had any interval recurrence of hematuria or painful/burning urination. Continues to void at his baseline enjoying a significant improvement since having Rezum performed back in 2021. Outside of some nocturia which is stable, he denies any bothersome daytime frequency/urgency, changes in force of stream or sensation of incomplete bladder emptying. Symptom score sheet assessment today is 2. UA without any noted microscopic abnormalities.   04/30/22: Above history noted. The patient presents back today with right flank pain for the past 5 days. He states that the pain is sharp, intermittent  only comes at night when laying supine. He denies nausea/vomiting, fever/chills, dysuria or hematuria. CT from 04/2021 revealed bilateral renal stones along with a 3 to 4 mm left ureteral stone that he has since passed.     ALLERGIES: No Allergies    MEDICATIONS: Aspirin 81 mg tablet,chewable  Metoprolol  Tartrate 25 mg tablet  Tamsulosin Hcl 0.4 mg capsule  Allegra Allergy  Allopurinol 100 mg tablet Oral  Amlodipine Besylate 5 mg tablet  Centrum Silver TABS Oral  Cholecaliferol  Co Q-10  Pantoprazole Sodium 40 mg tablet, delayed release  Pravastatin Sodium 40 mg tablet  Repatha Syringe  Vascepa 1 gram capsule  Zetia 10 mg tablet     GU PSH: Cysto Uretero Lithotripsy - 2014 Cystoscopy - 2021 ESWL - 2013, 2013, 2013 Pyelolithotomy - 2013 Trurl Dstrj Prst8 Tiss Rf Wv - 2021 Ureteroscopic laser litho, Right - 2018, Left - 2018       PSH Notes: Cystoscopy With Pyeloscopy With Lithotripsy, Lithotripsy, Lithotripsy - Whole Body (Extracorporeal Shock Wave), Pyelotomy With Lithotomy, Lithotripsy, Back Surgery   NON-GU PSH: Back Surgery (Unspecified) CABG (coronary artery bypass grafting)     GU PMH: BPH w/o LUTS - 08/14/2021 Renal calculus - 08/14/2021, - 05/08/2021, - 04/18/2021, - 2018, - 2018, Nephrolithiasis, - 2014 Ureteral calculus (Stable) - 08/14/2021, Calculus of ureter, - 2014 Microscopic hematuria - 05/11/2021, - 05/08/2021, - 04/18/2021 Flank Pain - 05/01/2021, - 04/25/2021, - 04/18/2021 BPH w/LUTS - 04/18/2021, - 2022, - 05/12/2020, - 2021, - 2021, Benign localized prostatic hyperplasia with lower urinary tract symptoms (LUTS), - 2014 Nocturia - 04/18/2021, - 2022, - 05/12/2020, - 2021 (Stable), - 2021, - 2019 ED due to arterial insufficiency - 2022 Urinary Retention - 05/12/2020, - 05/03/2020, - 2021 Weak Urinary Stream - 2021, - 2021 Urinary Urgency - 2021 Urinary Frequency - 2019 History of urolithiasis, Nephrolithiasis - 2014 Urinary Retention, Unspec, Urinary retention - 2014    NON-GU PMH: Gout, Gout - 2014 Personal history of other diseases of the circulatory system, History of hypertension - 2014 Personal history of other diseases of the digestive system, History of esophageal reflux - 2014 Personal history of other endocrine, nutritional and metabolic disease,  History of hypercholesterolemia - 2014 Encounter for general adult medical examination without abnormal findings, Encounter for preventive health examination GERD Heart disease, unspecified Hypercholesterolemia Hypertension    FAMILY HISTORY: Death In The Family Father - Runs In Family Prostate Cancer - Father   SOCIAL HISTORY: Marital Status: Married Preferred Language: English; Race: White Has never drank.  Drinks 2 caffeinated drinks per day. Patient's occupation is/was retired.     Notes: Marital History - Currently Married, Never A Smoker   REVIEW OF SYSTEMS:    GU Review Male:   Patient denies frequent urination, hard to postpone urination, burning/ pain with urination, get up at night to urinate, leakage of urine, stream starts and stops, trouble starting your stream, have to strain to urinate , erection problems, and penile pain.  Gastrointestinal (Upper):   Patient denies nausea, vomiting, and indigestion/ heartburn.  Gastrointestinal (Lower):   Patient denies diarrhea and constipation.  Constitutional:   Patient denies fever, night sweats, weight loss, and fatigue.  Skin:   Patient denies skin rash/ lesion and itching.  Eyes:   Patient denies blurred vision and double vision.  Ears/ Nose/ Throat:   Patient denies sore throat and sinus problems.  Hematologic/Lymphatic:   Patient denies swollen glands and easy bruising.  Cardiovascular:   Patient denies chest pains and  leg swelling.  Respiratory:   Patient denies cough and shortness of breath.  Endocrine:   Patient denies excessive thirst.  Musculoskeletal:   Patient denies back pain and joint pain.  Neurological:   Patient denies headaches and dizziness.  Psychologic:   Patient denies depression and anxiety.   VITAL SIGNS:      04/30/2022 09:29 AM  Weight 210 lb / 95.25 kg  Height 66 in / 167.64 cm  BP 139/84 mmHg  Pulse 87 /min  Temperature 97.7 F / 36.5 C  BMI 33.9 kg/m   Complexity of Data:  Records Review:    Previous Doctor Records   PROCEDURES:         KUB - 00938  A single view of the abdomen is obtained.      . Patient confirmed No Neulasta OnPro Device.   There is a 7 x 4 mm calcification along the expected course of the right distal ureter. There are multiple calcifications seen within both renal shadows. No calcifications are seen along the expected course of the left ureter. Lumbar spine hardware noted with no other bony abnormalities. No obvious bowel abnormalities are appreciated.          Renal Ultrasound - [id number]  Right Kidney: Length: 10.9 cm Depth:5.0 cm Cortical Width:1.3 cm Width:4.6 cm  Left Kidney: Length: 9.8 cm Depth:5.5 cm Cortical Width: .7 cm Width:4.4 cm  Left Kidney/Ureter:  There are multiple stones seen w the largest in the mid pole at 1 cm.  Right Kidney/Ureter:  There is mild/moderate hydro noted as well as a dilated proximal ureter. There are multiple stones seen w the largest in the lower pole calyx at 8.6 mm. There is a 4.4 mm mid pole calc. There is a large simple appearing upper pole cortical cyst that measures 6.6 cm.  Bladder:  PVR 13 ml      . Patient confirmed No Neulasta OnPro Device.   Moderate right-sided hydronephrosis seen involving the right renal pelvis with multiple areas of stone shadowing. No evidence of hydronephrosis is seen involving the left kidney. There are no solid parenchymal lesions involving either kidney. Bladder is decompressed with no internal abnormalities         Urinalysis w/Scope Dipstick Dipstick Cont'd Micro  Color: Yellow Bilirubin: Neg mg/dL WBC/hpf: NS (Not Seen)  Appearance: Clear Ketones: Neg mg/dL RBC/hpf: 10 - 20/hpf  Specific Gravity: 1.020 Blood: 2+ ery/uL Bacteria: NS (Not Seen)  pH: 6.0 Protein: Neg mg/dL Cystals: NS (Not Seen)  Glucose: Neg mg/dL Urobilinogen: 0.2 mg/dL Casts: NS (Not Seen)    Nitrites: Neg Trichomonas: Not Present    Leukocyte Esterase: Neg leu/uL Mucous: Not Present      Epithelial  Cells: NS (Not Seen)      Yeast: NS (Not Seen)      Sperm: Not Present    ASSESSMENT:      ICD-10 Details  1 GU:   Ureteral calculus - N20.1 Right, Undiagnosed New Problem - 7x4 mm right UVJ stone   2   Ureteral obstruction secondary to calculous - N13.2 Right, Undiagnosed New Problem     PLAN:            Medications New Meds: Percocet 5 mg-325 mg tablet 1 tablet PO Q 4 H PRN   #20  0 Refill(s)  Tamsulosin Hcl 0.4 mg capsule 1 capsule PO Daily   #30  0 Refill(s)  Pharmacy Name:  [id number]  Address:  [address]  [place], [place] [id number]  Phone:  [phone]  Fax:  [phone]            Orders Labs BMP  X-Rays: Renal Ultrasound    KUB          Schedule Return Visit/Planned Activity: Next Available Appointment - Schedule Surgery          Document Letter(s):  Created for Patient: Clinical Summary         Notes: The risks, benefits and alternatives of cystoscopy with RIGHT ureteroscopy, laser lithotripsy and ureteral stent placement was discussed the patient.  Risks included, but are not limited to: bleeding, urinary tract infection, ureteral injury/avulsion, ureteral stricture formation, retained stone fragments, the possibility that multiple surgeries may be required to treat the stone(s), MI, stroke, PE and the inherent risks of general anesthesia.  The patient voices understanding and wishes to proceed.

## 2022-05-03 ENCOUNTER — Encounter (HOSPITAL_BASED_OUTPATIENT_CLINIC_OR_DEPARTMENT_OTHER): Payer: Self-pay | Admitting: Urology

## 2022-05-03 ENCOUNTER — Ambulatory Visit (HOSPITAL_BASED_OUTPATIENT_CLINIC_OR_DEPARTMENT_OTHER): Admission: RE | Admit: 2022-05-03 | Payer: Medicare Other | Source: Home / Self Care | Admitting: Urology

## 2022-05-03 SURGERY — LITHOTRIPSY, ESWL
Anesthesia: LOCAL | Laterality: Right

## 2022-05-30 ENCOUNTER — Encounter: Payer: Self-pay | Admitting: Cardiology

## 2022-05-30 ENCOUNTER — Ambulatory Visit: Payer: Medicare Other | Attending: Cardiology | Admitting: Cardiology

## 2022-05-30 VITALS — BP 146/82 | HR 83 | Ht 66.0 in | Wt 210.6 lb

## 2022-05-30 DIAGNOSIS — I10 Essential (primary) hypertension: Secondary | ICD-10-CM | POA: Diagnosis not present

## 2022-05-30 DIAGNOSIS — I25810 Atherosclerosis of coronary artery bypass graft(s) without angina pectoris: Secondary | ICD-10-CM

## 2022-05-30 DIAGNOSIS — E785 Hyperlipidemia, unspecified: Secondary | ICD-10-CM

## 2022-05-30 MED ORDER — AMLODIPINE BESYLATE 10 MG PO TABS: 10.0000 mg | ORAL_TABLET | Freq: Every day | ORAL | 3 refills | Status: DC

## 2022-05-30 NOTE — Progress Notes (Signed)
Date:  05/30/2022   ID:  [name] [name], [name] [name] [birth year], MRN [id number]  PCP:  Marden Noble, MD  Cardiologist:  Armanda Magic, MD Electrophysiologist:  None   Chief Complaint:  CAD, HTN  History of Present Illness:    [name] [name] is a 76 y.o. male with a hx of ASCAD.  He initially presented with chest pain and underwent myoview which was neg for ischemia but Chest pain continued. Cardiac CT showed coronary calcium score 1951 and severe > 70 % stenosis in prox LAD. along with possible significant disease in the ostial RCA and LCX.  It was felt that he could have balanced ischemia on nuc.   Cath showed severe CAD and underwent CABG by Dr. Cornelius Moras 11/04/15 with LIMA to distal LAD, SVG to 1st OM of LCx, seq VG to 2nd OM of LCX.  He had normal LV systolic function.  He had some CP earlier in the year along with elevated BP.  Started on Protonix with resolution of CP.   he is here today for followup and is doing well.  He denies any chest pain or pressure, SOB, DOE, PND, orthopnea, LE edema, dizziness, palpitations or syncope. He is compliant with his meds and is tolerating meds with no SE.    Prior CV studies:   The following studies were reviewed today:  Labs,   Past Medical History:  Diagnosis Date   BPH associated with nocturia    CAD (coronary artery disease), native coronary artery    CARDIOLOGIST-  DR Gloris Manchester Alieu Finnigan--  normal myoview 03/ 2017 but Cardiac CT 04/ 2017 calcium score 1951, >70% LAD stenosis possible disease RCA , LCX--- cath done showed severe 3V disease S/P  CABG 04/ 2017   Chronic back pain    herniated disc   CKD (chronic kidney disease), stage III (HCC)    Diverticulosis    GERD (gastroesophageal reflux disease)    Gout    as of 03-20-2017 per pt stable   History of kidney stones    METABOLIC STONE DISEASE   History of urinary retention    POST OP SURGERY 08/ 2013   Hypercalciuria    Hyperlipidemia    Hypertension    LBBB (left bundle branch block)    Muscle  cramps    legs occasonally in am   Renal calculus, right    Renal cyst, right    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   LIMA to LAD, Sequential SVG to OM1-OM2, EVH via right thigh   Seasonal allergies    Past Surgical History:  Procedure Laterality Date   CARDIAC CATHETERIZATION N/A 10/28/2015   Procedure: Left Heart Cath and Coronary Angiography;  Surgeon: [name], MD;  Location: [facility] INVASIVE CV LAB;  Service: Cardiovascular;  Laterality: N/A;   CARDIAC CATHETERIZATION  05-21-1995  dr Verdis Prime   non-obstructive cad;  normal lvf   CARDIOVASCULAR STRESS TEST  09/23/2015  dr Gloris Manchester Regnald Bowens   Normal nuclear study w/ no prior infarct and no ischemia/  normal LV function and wall motion , nuclear stress ef 54%   COLONOSCOPY  last one 2009   CORONARY ARTERY BYPASS GRAFT N/A 11/04/2015   Procedure: CORONARY ARTERY BYPASS GRAFTING (CABG) times three using left internal mammary artery and right saphenous vein harvested with endoscope.;  Surgeon: [name], MD;  Location: [facility] OR;  Service: Open Heart Surgery;  Laterality: N/A;   CYSTOSCOPY W/ URETERAL STENT PLACEMENT Right 05/02/2022   Procedure: CYSTOSCOPY WITH RETROGRADE  PYELOGRAM/URETERAL STENT PLACEMENT;  Surgeon: Rene Paci, MD;  Location: [facility];  Service: Urology;  Laterality: Right;  30 MINS   CYSTOSCOPY WITH RETROGRADE PYELOGRAM, URETEROSCOPY AND STENT PLACEMENT Left 03/09/2017   Procedure: CYSTOSCOPY WITH RETROGRADE PYELOGRAM, URETEROSCOPY AND STENT PLACEMENT;  Surgeon: Sebastian Ache, MD;  Location: [facility];  Service: Urology;  Laterality: Left;   CYSTOSCOPY WITH URETEROSCOPY  07/28/2012   Procedure: CYSTOSCOPY WITH URETEROSCOPY;  Surgeon: [name], MD;  Location: [facility];  Service: Urology;  Laterality: Left;      CYSTOSCOPY/URETEROSCOPY/HOLMIUM LASER/STENT PLACEMENT Right 03/25/2017   Procedure: CYSTOSCOPY RIGHT URETEROSCOPYVHOLMIUM LASER STENT PLACEMENT;  Surgeon: Crista Elliot, MD;  Location: [facility];  Service: Urology;  Laterality: Right;   ESOPHAGOGASTRODUODENOSCOPY (EGD) WITH ESOPHAGEAL DILATION  05/ 2018 approx.   EXTRACORPOREAL SHOCK WAVE LITHOTRIPSY  multiple last one 05-29-2012   FOOT SURGERY  1990s   right-removed bone spur    HOLMIUM LASER APPLICATION  07/28/2012   Procedure: HOLMIUM LASER APPLICATION;  Surgeon: [name], MD;  Location: [facility];  Service: Urology;  Laterality: Left;  left lithotripsy   HOLMIUM LASER APPLICATION Left 03/09/2017   Procedure: HOLMIUM LASER APPLICATION;  Surgeon: Sebastian Ache, MD;  Location: [facility];  Service: Urology;  Laterality: Left;   LAPAROSCOPIC APPENDECTOMY N/A 12/20/2018   Procedure: APPENDECTOMY LAPAROSCOPIC;  Surgeon: Axel Filler, MD;  Location: [facility];  Service: General;  Laterality: N/A;   POSTERIOR LUMBAR FUSION  03/07/2012   w/ laminecotmy L4-5   PYELOLITHOTOMY Right 01/1982   TEE WITHOUT CARDIOVERSION N/A 11/04/2015   Procedure: TRANSESOPHAGEAL ECHOCARDIOGRAM (TEE);  Surgeon: [name], MD;  Location: [facility] OR;  Service: Open Heart Surgery;  Laterality: N/A;   TONSILLECTOMY  age 62   URETEROLITHOTOMY Right 03/1994     No outpatient medications have been marked as taking for the 05/30/22 encounter (Office Visit) with Quintella Reichert, MD.     Allergies:   Atorvastatin, Hydrochlorothiazide, and Rosuvastatin   Social History   Tobacco Use   Smoking status: Never   Smokeless tobacco: Never  Vaping Use   Vaping Use: Never used  Substance Use Topics   Alcohol use: No    Alcohol/week: 0.0 standard drinks of alcohol   Drug use: No     Family Hx: The patient's family history includes Dementia in his mother; Prostate cancer in his father.  ROS:   Please see the history of present illness.     All other systems reviewed and are negative.   Labs/Other Tests and Data Reviewed:    Recent Labs: 06/29/2021: ALT 27 10/30/2021: Platelets 172 05/02/2022: BUN 27; Creatinine, Ser 2.50; Hemoglobin 14.6;  Potassium 3.7; Sodium 143   Recent Lipid Panel Lab Results  Component Value Date/Time   CHOL 84 (L) 06/29/2021 08:50 AM   TRIG 147 06/29/2021 08:50 AM   HDL 51 06/29/2021 08:50 AM   CHOLHDL 1.6 06/29/2021 08:50 AM   CHOLHDL 3.4 12/19/2015 10:05 AM   LDLCALC 9 06/29/2021 08:50 AM    Wt Readings from Last 3 Encounters:  05/02/22 209 lb 1.6 oz (94.8 kg)  11/22/21 210 lb (95.3 kg)  11/03/21 220 lb (99.8 kg)     Objective:    Vital Signs:  There were no vitals taken for this visit.   GEN: Well nourished, well developed in no acute distress HEENT: Normal NECK: No JVD; No carotid bruits LYMPHATICS: No lymphadenopathy CARDIAC:RRR, no murmurs, rubs, gallops RESPIRATORY:  Clear to auscultation without rales,  wheezing or rhonchi  ABDOMEN: Soft, non-tender, non-distended MUSCULOSKELETAL:  No edema; No deformity  SKIN: Warm and dry NEUROLOGIC:  Alert and oriented x 3 PSYCHIATRIC:  Normal affect  ASSESSMENT & PLAN:    1.  ASCAD  - cath 2017 showed 80% mid left circumflex, 80% ostial LAD, 60% left main, 75% ostial left circumflex, 75% ramus, 40% ostial RCA.   -He is status post CABG with LIMA to the LAD, SVG to 1st OM of LCx, seq VG to 2nd OM of LCX .   -He has not had any recent anginal symptoms -Continue prescription drug with aspirin 81 mg daily, Lopressor 25 mg twice daily and statin therapy with as needed refills  2.  Hypertension  -BP is borderline controlled on exam today -Continue prescription management with Lopressor 25 mg twice daily and Micardis 20 mg daily with as needed refills -increase amlodipine to 10mg  daily -check BP twice daily for a week and call with results.  -I have personally reviewed and interpreted outside labs performed by patient's PCP which showed serum creatinine at 1.7 and potassium 4 on 05/10/2022>> he had a kidney stone a few weeks ago and SCr went up to 2.5 and now down to 1.7.  Will repeat BMET    3.  Hyperlipidemia  -his LDL goal is <  70 -Continue prescription drug management with statin 80 mg daily, Vascepa 2 g twice daily and Zetia 10 mg daily with as needed refills -Repeat FLP and ALT   Medication Adjustments/Labs and Tests Ordered: Current medicines are reviewed at length with the patient today.  Concerns regarding medicines are outlined above.  Tests Ordered: No orders of the defined types were placed in this encounter.   Medication Changes: No orders of the defined types were placed in this encounter.    Disposition:  Follow up in 1 year(s)  Signed, 05/12/2022, MD  05/30/2022 2:17 PM    [facility]

## 2022-05-30 NOTE — Patient Instructions (Signed)
Medication Instructions:  Your physician has recommended you make the following change in your medication:  1) INCREASE amlodipine 10 mg daily  *If you need a refill on your cardiac medications before your next appointment, please call your pharmacy*  Check your blood pressure twice daily for one week and call us with a list of your readings   Lab Work: TOMORROW: CMET and fasting lipids between 7:15am and 5:00pm If you have labs (blood work) drawn today and your tests are completely normal, you will receive your results only by: MyChart Message (if you have MyChart) OR A paper copy in the mail If you have any lab test that is abnormal or we need to change your treatment, we will call you to review the results.  Follow-Up: At [facility], you and your health needs are our priority.  As part of our continuing mission to provide you with exceptional heart care, we have created designated Provider Care Teams.  These Care Teams include your primary Cardiologist (physician) and Advanced Practice Providers (APPs -  Physician Assistants and Nurse Practitioners) who all work together to provide you with the care you need, when you need it.  Your next appointment:   1 year(s)  The format for your next appointment:   In Person  Provider:   Armanda Magic, MD     Important Information About Sugar

## 2022-05-30 NOTE — Addendum Note (Signed)
Addended by: Frutoso Schatz on: 05/30/2022 03:00 PM   Modules accepted: Orders

## 2022-05-31 ENCOUNTER — Ambulatory Visit: Payer: Medicare Other | Attending: Cardiology

## 2022-05-31 DIAGNOSIS — I10 Essential (primary) hypertension: Secondary | ICD-10-CM

## 2022-05-31 DIAGNOSIS — I25810 Atherosclerosis of coronary artery bypass graft(s) without angina pectoris: Secondary | ICD-10-CM

## 2022-05-31 DIAGNOSIS — E785 Hyperlipidemia, unspecified: Secondary | ICD-10-CM

## 2022-05-31 LAB — COMPREHENSIVE METABOLIC PANEL
ALT: 21 IU/L (ref 0–44)
AST: 24 IU/L (ref 0–40)
Albumin/Globulin Ratio: 1.6 (ref 1.2–2.2)
Albumin: 4.5 g/dL (ref 3.8–4.8)
Alkaline Phosphatase: 97 IU/L (ref 44–121)
BUN/Creatinine Ratio: 10 (ref 10–24)
BUN: 16 mg/dL (ref 8–27)
Bilirubin Total: 0.6 mg/dL (ref 0.0–1.2)
CO2: 23 mmol/L (ref 20–29)
Calcium: 9.6 mg/dL (ref 8.6–10.2)
Chloride: 106 mmol/L (ref 96–106)
Creatinine, Ser: 1.58 mg/dL — ABNORMAL HIGH (ref 0.76–1.27)
Globulin, Total: 2.8 g/dL (ref 1.5–4.5)
Glucose: 97 mg/dL (ref 70–99)
Potassium: 4.3 mmol/L (ref 3.5–5.2)
Sodium: 142 mmol/L (ref 134–144)
Total Protein: 7.3 g/dL (ref 6.0–8.5)
eGFR: 45 mL/min/{1.73_m2} — ABNORMAL LOW (ref >59)

## 2022-05-31 LAB — LIPID PANEL
Chol/HDL Ratio: 1.6 ratio (ref 0.0–5.0)
Cholesterol, Total: 78 mg/dL — ABNORMAL LOW (ref 100–199)
HDL: 49 mg/dL (ref >39)
LDL Chol Calc (NIH): 9 mg/dL (ref 0–99)
Triglycerides: 106 mg/dL (ref 0–149)
VLDL Cholesterol Cal: 20 mg/dL (ref 5–40)

## 2022-06-04 ENCOUNTER — Telehealth: Payer: Self-pay

## 2022-06-04 DIAGNOSIS — I25810 Atherosclerosis of coronary artery bypass graft(s) without angina pectoris: Secondary | ICD-10-CM

## 2022-06-04 MED ORDER — PRAVASTATIN SODIUM 40 MG PO TABS: 40.0000 mg | ORAL_TABLET | Freq: Every evening | ORAL | 3 refills | Status: DC

## 2022-06-04 NOTE — Telephone Encounter (Signed)
The patient has been notified of the result and verbalized understanding.  All questions (if any) were answered. Frutoso Schatz, RN 06/04/2022 12:59 PM

## 2022-06-04 NOTE — Telephone Encounter (Signed)
-----   Message from Quintella Reichert, MD sent at 06/04/2022  8:43 AM EST ----- Decrease Pravastatin to 40mg  daily and check FLP and ALT in  6 weeks ----- Message ----- From: , [facility] Sent: 06/04/2022   8:20 AM EST To: 06/06/2022, MD; Quintella Reichert, MD; #  Yes, recommend reducing to 40mg  daily  ----- Message ----- From: Marden Noble, MD Sent: 06/01/2022   8:43 AM EST To: Quintella Reichert, MD; 06/03/2022, [facility]; #  Stable labs  Cheree Ditto do you think we should back off the pravastatin since the LDL is so low

## 2022-06-14 ENCOUNTER — Other Ambulatory Visit: Payer: Self-pay | Admitting: Cardiology

## 2022-06-14 MED ORDER — PRAVASTATIN SODIUM 40 MG PO TABS: 40.0000 mg | ORAL_TABLET | Freq: Every evening | ORAL | 3 refills | Status: DC

## 2022-07-26 ENCOUNTER — Other Ambulatory Visit (HOSPITAL_COMMUNITY): Payer: Self-pay

## 2022-08-03 ENCOUNTER — Ambulatory Visit: Payer: Medicare Other | Attending: Cardiology

## 2022-08-03 DIAGNOSIS — I25810 Atherosclerosis of coronary artery bypass graft(s) without angina pectoris: Secondary | ICD-10-CM

## 2022-08-03 LAB — ALT: ALT: 23 IU/L (ref 0–44)

## 2022-08-03 LAB — LIPID PANEL
Chol/HDL Ratio: 1.5 ratio (ref 0.0–5.0)
Cholesterol, Total: 82 mg/dL — ABNORMAL LOW (ref 100–199)
HDL: 54 mg/dL (ref >39)
LDL Chol Calc (NIH): 3 mg/dL (ref 0–99)
Triglycerides: 150 mg/dL — ABNORMAL HIGH (ref 0–149)
VLDL Cholesterol Cal: 25 mg/dL (ref 5–40)

## 2022-08-06 ENCOUNTER — Telehealth: Payer: Self-pay

## 2022-08-06 NOTE — Telephone Encounter (Signed)
Called patient to review elevated TAGS result. Patient states he has been taking all cholesterol-lowering meds as ordered (vascepa 2 grams BID, pravastatin 40 mg every evening, and repatha injections every 2 weeks). He denies any changes to his diet.   However, patient states he recently received a notification from his insurance that his vascepa would no longer be covered and he is wondering if there is another medication that he would be able to take instead. He states that he currently has a month supply of vascepa on hand.

## 2022-08-06 NOTE — Telephone Encounter (Signed)
-----  Message from Sueanne Margarita, MD sent at 08/05/2022  4:24 PM EST ----- Make sure patient is compliant with his Vascepa as TAGS are elevated

## 2022-08-07 NOTE — Telephone Encounter (Signed)
I tried submitting prior auth request for Vascepa to his Part D plan and received message that med is on his list of covered drugs. He shouldn't have an issue refilling his Vascepa and should continue current therapy.

## 2022-08-13 NOTE — Telephone Encounter (Signed)
Called and left detailed message on patient's voice mail. Expressed that I wanted to check that he was able to get his Vascepa as ordered as our records were showing that his insurance should have approved it. Await callback.

## 2022-08-22 NOTE — Telephone Encounter (Signed)
Was able to speak with patient and verify that he did receive his Vascepa prescription and has been taking it as ordered.

## 2022-09-25 ENCOUNTER — Other Ambulatory Visit: Payer: Self-pay | Admitting: Cardiology

## 2022-10-06 ENCOUNTER — Other Ambulatory Visit: Payer: Self-pay | Admitting: Cardiology

## 2022-10-06 DIAGNOSIS — K219 Gastro-esophageal reflux disease without esophagitis: Secondary | ICD-10-CM

## 2023-03-30 ENCOUNTER — Other Ambulatory Visit: Payer: Self-pay | Admitting: Cardiology

## 2023-03-30 DIAGNOSIS — E785 Hyperlipidemia, unspecified: Secondary | ICD-10-CM

## 2023-03-30 DIAGNOSIS — Z951 Presence of aortocoronary bypass graft: Secondary | ICD-10-CM

## 2023-03-30 DIAGNOSIS — I2510 Atherosclerotic heart disease of native coronary artery without angina pectoris: Secondary | ICD-10-CM

## 2023-05-15 ENCOUNTER — Other Ambulatory Visit: Payer: Self-pay | Admitting: Cardiology

## 2023-05-21 ENCOUNTER — Other Ambulatory Visit: Payer: Self-pay | Admitting: Cardiology

## 2023-06-04 NOTE — Progress Notes (Signed)
  Cardiology Office Note:  .   Date:  06/18/2023  ID:  HAGER ZIMA, DOB 1945-10-13, MRN [id number] PCP: Marden Noble, MD (Inactive)  [facility] Providers Cardiologist:  Armanda Magic, MD    History of Present Illness: .   [name] [name] is a 77 y.o. male  with a hx of ASCAD.  He initially presented with chest pain and underwent myoview which was neg for ischemia but Chest pain continued. Cardiac CT showed coronary calcium score 1951 and severe > 70 % stenosis in prox LAD. along with possible significant disease in the ostial RCA and LCX.  It was felt that he could have balanced ischemia on nuc.   Cath showed severe CAD and underwent CABG by Dr. Cornelius Moras 11/04/15 with LIMA to distal LAD, SVG to 1st OM of LCx, seq VG to 2nd OM of LCX.  He had normal LV systolic function.    Patient comes in for yearly f/u. His wife passed away in 11/28/22. Doing a lot of house renovations. PCP questioned why he's on pravastatin with an LDL 10. Golfing 3 days a week.Does a lot of yard work. Denies chest pain, dyspnea, palpitations, edema, dizziness. Has mild edema. Getting extra salt in his diet.   ROS:    Studies Reviewed: [name]          Prior CV Studies:     Risk Assessment/Calculations:             Physical Exam:   VS:  BP 126/68 (BP Location: Right Arm, Patient Position: Sitting, Cuff Size: Large)   Pulse 81   Resp 16   Ht 5\' 6"  (1.676 m)   Wt 218 lb (98.9 kg)   SpO2 94%   BMI 35.19 kg/m    Wt Readings from Last 3 Encounters:  06/18/23 218 lb (98.9 kg)  05/30/22 210 lb 9.6 oz (95.5 kg)  05/02/22 209 lb 1.6 oz (94.8 kg)    GEN: Obese,  in no acute distress NECK: No JVD; No carotid bruits CARDIAC:  RRR, no murmurs, rubs, gallops RESPIRATORY:  Clear to auscultation without rales, wheezing or rhonchi  ABDOMEN: Soft, non-tender, non-distended EXTREMITIES:  No edema; No deformity   ASSESSMENT AND PLAN: .   CAD S/P CAGB 2017,  No exertional symptoms on ASA, amlodipine, metoprolol, micardis    HTN BP controlled but some ankle edema-eating out more-2 gm sodium diet discussed   HLD managed by lipid clinic LDL 10 2022-11-28. Discussed with lipid clinic and will reduce pravastatin 20 mg once daily   Obestiy-can't exercise but weight loss encourage.        Dispo: f/u in 1 yr.  Signed, Jacolyn Reedy, PA-C

## 2023-06-17 ENCOUNTER — Other Ambulatory Visit: Payer: Self-pay | Admitting: Cardiology

## 2023-06-18 ENCOUNTER — Ambulatory Visit: Payer: Medicare Other | Attending: Physician Assistant | Admitting: Physician Assistant

## 2023-06-18 VITALS — BP 126/68 | HR 81 | Resp 16 | Ht 66.0 in | Wt 218.0 lb

## 2023-06-18 DIAGNOSIS — E66812 Obesity, class 2: Secondary | ICD-10-CM

## 2023-06-18 DIAGNOSIS — E785 Hyperlipidemia, unspecified: Secondary | ICD-10-CM | POA: Diagnosis not present

## 2023-06-18 DIAGNOSIS — I25810 Atherosclerosis of coronary artery bypass graft(s) without angina pectoris: Secondary | ICD-10-CM

## 2023-06-18 DIAGNOSIS — Z6835 Body mass index (BMI) 35.0-35.9, adult: Secondary | ICD-10-CM

## 2023-06-18 DIAGNOSIS — I10 Essential (primary) hypertension: Secondary | ICD-10-CM | POA: Diagnosis not present

## 2023-06-18 MED ORDER — PRAVASTATIN SODIUM 20 MG PO TABS: 20.0000 mg | ORAL_TABLET | Freq: Every evening | ORAL | 3 refills | Status: AC

## 2023-06-18 MED ORDER — NITROGLYCERIN 0.4 MG SL SUBL: 0.4000 mg | SUBLINGUAL_TABLET | SUBLINGUAL | 3 refills | Status: AC | PRN

## 2023-06-18 NOTE — Patient Instructions (Signed)
Medication Instructions:   DECREASE Pravastatin one (1) tablet by mouth ( 20 mg) daily. You can use up your (40 mg) tablet and cut in half.   START nitroglycerin   If a single episode of chest pain is not relieved by one tablet, the patient will try another within 5 minutes; and if this doesn't relieve the pain, the patient will try another within 5 minutes and if this doesn't relieve the pain the patient is instructed to call 911 for transportation to an emergency department.DO NIO  DO NOT take Viagra in 24 hours of nitroglycerin.   *If you need a refill on your cardiac medications before your next appointment, please call your pharmacy*   Lab Work:  None ordered.  If you have labs (blood work) drawn today and your tests are completely normal, you will receive your results only by: MyChart Message (if you have MyChart) OR A paper copy in the mail If you have any lab test that is abnormal or we need to change your treatment, we will call you to review the results.   Testing/Procedures:  None ordered.   Follow-Up: At [facility], you and your health needs are our priority.  As part of our continuing mission to provide you with exceptional heart care, we have created designated Provider Care Teams.  These Care Teams include your primary Cardiologist (physician) and Advanced Practice Providers (APPs -  Physician Assistants and Nurse Practitioners) who all work together to provide you with the care you need, when you need it.  We recommend signing up for the patient portal called "MyChart".  Sign up information is provided on this After Visit Summary.  MyChart is used to connect with patients for Virtual Visits (Telemedicine).  Patients are able to view lab/test results, encounter notes, upcoming appointments, etc.  Non-urgent messages can be sent to your provider as well.   To learn more about what you can do with MyChart, go to [email or web address].    Your next  appointment:   1 year(s)  Provider:   Armanda Magic, MD     Other Instructions  Your physician wants you to follow-up in: 1 year.  You will receive a reminder letter in the mail two months in advance. If you don't receive a letter, please call our office to schedule the follow-up appointment.

## 2023-06-19 ENCOUNTER — Other Ambulatory Visit: Payer: Self-pay

## 2023-06-19 MED ORDER — METOPROLOL TARTRATE 25 MG PO TABS: ORAL_TABLET | ORAL | 3 refills | Status: DC

## 2023-06-26 ENCOUNTER — Other Ambulatory Visit: Payer: Self-pay | Admitting: Cardiology

## 2023-07-29 ENCOUNTER — Other Ambulatory Visit: Payer: Self-pay | Admitting: Cardiology

## 2023-08-16 ENCOUNTER — Other Ambulatory Visit: Payer: Self-pay | Admitting: Cardiology

## 2023-08-22 ENCOUNTER — Telehealth: Payer: Self-pay

## 2023-08-22 NOTE — Telephone Encounter (Signed)
   Pre-operative Risk Assessment    Patient Name: [name] [name]  DOB: 1945/09/12 MRN: [id number]   Date of last office visit: 06/18/23 Date of next office visit: No0t scheduled   Request for Surgical Clearance    Procedure:   Left knee scope  Date of Surgery:  Clearance TBD                                Surgeon:  Not indicated Surgeon's Group or Practice Name:  [facility] Phone number:  [phone] Fax number:  [phone]   Type of Clearance Requested:   - Medical  - Pharmacy:  Hold Aspirin      Type of Anesthesia:   Choice   Additional requests/questions:    Bonney Ival LOISE Gerome   08/22/2023, 3:05 PM

## 2023-08-23 NOTE — Telephone Encounter (Signed)
   Name: [name] [name]  DOB: [birth year]  MRN: [id number]  Primary Cardiologist: Wilbert Bihari, MD   Preoperative team, please contact this patient and set up a phone call appointment for further preoperative risk assessment. Please obtain consent and complete medication review. Thank you for your help.  I confirm that guidance regarding antiplatelet and oral anticoagulation therapy has been completed and, if necessary, noted below.  His aspirin  may be held for 5 to 7 days prior to his procedure.  Please resume as soon as hemostasis is achieved.  I also confirmed the patient resides in the state of [place] . As per [facility] Medical Board telemedicine laws, the patient must reside in the state in which the provider is licensed.   [name], NP 08/23/2023, 1:04 PM [facility]

## 2023-08-26 ENCOUNTER — Telehealth: Payer: Self-pay | Admitting: *Deleted

## 2023-08-26 NOTE — Telephone Encounter (Signed)
 Tele preop appt 09/06/23. Med rec and consent are done.      Patient Consent for Virtual Visit        [name] [name] has provided verbal consent on 08/26/2023 for a virtual visit (video or telephone).   CONSENT FOR VIRTUAL VISIT FOR:  [name] [name]  By participating in this virtual visit I agree to the following:  I hereby voluntarily request, consent and authorize [facility] and its employed or contracted physicians, physician assistants, nurse practitioners or other licensed health care professionals (the Practitioner), to provide me with telemedicine health care services (the "Services") as deemed necessary by the treating Practitioner. I acknowledge and consent to receive the Services by the Practitioner via telemedicine. I understand that the telemedicine visit will involve communicating with the Practitioner through live audiovisual communication technology and the disclosure of certain medical information by electronic transmission. I acknowledge that I have been given the opportunity to request an in-person assessment or other available alternative prior to the telemedicine visit and am voluntarily participating in the telemedicine visit.  I understand that I have the right to withhold or withdraw my consent to the use of telemedicine in the course of my care at any time, without affecting my right to future care or treatment, and that the Practitioner or I may terminate the telemedicine visit at any time. I understand that I have the right to inspect all information obtained and/or recorded in the course of the telemedicine visit and may receive copies of available information for a reasonable fee.  I understand that some of the potential risks of receiving the Services via telemedicine include:  Delay or interruption in medical evaluation due to technological equipment failure or disruption; Information transmitted may not be sufficient (e.g. poor resolution of images) to allow  for appropriate medical decision making by the Practitioner; and/or  In rare instances, security protocols could fail, causing a breach of personal health information.  Furthermore, I acknowledge that it is my responsibility to provide information about my medical history, conditions and care that is complete and accurate to the best of my ability. I acknowledge that Practitioner's advice, recommendations, and/or decision may be based on factors not within their control, such as incomplete or inaccurate data provided by me or distortions of diagnostic images or specimens that may result from electronic transmissions. I understand that the practice of medicine is not an exact science and that Practitioner makes no warranties or guarantees regarding treatment outcomes. I acknowledge that a copy of this consent can be made available to me via my patient portal [facility] MyChart), or I can request a printed copy by calling the office of [facility].    I understand that my insurance will be billed for this visit.   I have read or had this consent read to me. I understand the contents of this consent, which adequately explains the benefits and risks of the Services being provided via telemedicine.  I have been provided ample opportunity to ask questions regarding this consent and the Services and have had my questions answered to my satisfaction. I give my informed consent for the services to be provided through the use of telemedicine in my medical care

## 2023-08-26 NOTE — Telephone Encounter (Signed)
 Tele preop appt 09/06/23. Med rec and consent are done.

## 2023-08-30 ENCOUNTER — Telehealth: Payer: Self-pay

## 2023-08-30 DIAGNOSIS — I10 Essential (primary) hypertension: Secondary | ICD-10-CM

## 2023-08-30 NOTE — Telephone Encounter (Signed)
-----   Message from Armanda Magic sent at 08/28/2023  9:22 AM EST ----- Regarding: RE: METOPROLOL I never stop metoprolol so I am wondering if he is getting it confused with something else ----- Message ----- From: Luellen Pucker, RN Sent: 08/26/2023   4:39 PM EST To: Quintella Reichert, MD Subject: FW: METOPROLOL                                 I am not seeing where metoprolol was ever stopped. Alcario Drought ----- Message ----- From: Tarri Fuller, CMA Sent: 08/26/2023  10:35 AM EST To: Luellen Pucker, RN Subject: METOPROLOL                                     Good morning Alcario Drought,   I just s/w pt about preop stuff. When I went over his medications he said that he requested refill for Metoprolol. Pt states the pharmacist said that Dr. Mayford Knife denied and said he did not need to take anymore.   I just thought I would send you a notes on that as it was still on his med list. I did mark today as pt not taking.    Thank you  Okey Regal

## 2023-08-30 NOTE — Telephone Encounter (Signed)
Call to patient to ascertain when he was taken off of metoprolol tartrate. Patient states he does not know. He also cannot tell me who took him off it or why. Our records indicate that when he saw Herma Carson on 06/18/23 he was not taken off it. A refill order was sent on 06/18/24. He does not check his BP or HR at home.   Patient has pre-op appointment for 09/06/23.

## 2023-09-02 NOTE — Telephone Encounter (Signed)
 24 hour BP monitor ordered per Dr. Mayford Knife. Patient notified via MyChart.

## 2023-09-02 NOTE — Addendum Note (Signed)
 Addended by: Luellen Pucker on: 09/02/2023 05:57 PM   Modules accepted: Orders

## 2023-09-04 NOTE — Progress Notes (Unsigned)
 Virtual Visit via Telephone Note   Because of VIR WHETSTINE co-morbid illnesses, he is at least at moderate risk for complications without adequate follow up.  This format is felt to be most appropriate for this patient at this time.  Due to technical limitations with video connection (technology), today's appointment will be conducted as an audio only telehealth visit, and [name] [name] verbally agreed to proceed in this manner.   All issues noted in this document were discussed and addressed.  No physical exam could be performed with this format.  Evaluation Performed:  Preoperative cardiovascular risk assessment _____________   Date:  09/06/2023   Patient ID:  [name] [name], [name] [name] 02-23-1946, MRN [id number] Patient Location:  Home Provider location:   Office  Primary Care Provider:  Eleanora Neighbor, MD  Primary Cardiologist:  Armanda Magic, MD  Chief Complaint / Patient Profile   78 y.o. y/o male with a h/o ASCAD, Cardiac CT showed coronary calcium score 1951 and severe > 70 % stenosis in prox LAD. along with possible significant disease in the ostial RCA and LCX.  It was felt that he could have balanced ischemia on nuc.   Cath showed severe CAD and underwent CABG by Dr. Cornelius Moras 11/04/15 with LIMA to distal LAD, SVG to 1st OM of LCx, seq VG to 2nd OM of LCX.    He is pending left knee scope by [facility] physician, Dr. Teryl Lucy, on date to be determined with recommendation request on holding aspirin,  and presents today for telephonic preoperative cardiovascular risk assessment.  History of Present Illness    [name] [name] is a 78 y.o. male who presents via audio/video conferencing for a telehealth visit today.  Pt was last seen in cardiology clinic on 06/18/2023 by Wynell Balloon, PA.  At that time [name] [name] was doing well golfing 3 days a week.Does a lot of yard work. Denies chest pain, dyspnea, palpitations, edema, dizziness. .  The patient is now pending procedure as  outlined above. Since his last visit, he has done well and continues to be active. No cardiac complaints.  Past Medical History    Past Medical History:  Diagnosis Date   BPH associated with nocturia    CAD (coronary artery disease), native coronary artery    CARDIOLOGIST-  DR Gloris Manchester TURNER--  normal myoview 03/ 2017 but Cardiac CT 04/ 2017 calcium score 1951, >70% LAD stenosis possible disease RCA , LCX--- cath done showed severe 3V disease S/P  CABG 04/ 2017   Chronic back pain    herniated disc   CKD (chronic kidney disease), stage III (HCC)    Diverticulosis    GERD (gastroesophageal reflux disease)    Gout    as of 03-20-2017 per pt stable   History of kidney stones    METABOLIC STONE DISEASE   History of urinary retention    POST OP SURGERY 08/ 2013   Hypercalciuria    Hyperlipidemia    Hypertension    LBBB (left bundle branch block)    Muscle cramps    legs occasonally in am   Renal calculus, right    Renal cyst, right    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   LIMA to LAD, Sequential SVG to OM1-OM2, EVH via right thigh   Seasonal allergies    Past Surgical History:  Procedure Laterality Date   CARDIAC CATHETERIZATION N/A 10/28/2015   Procedure: Left Heart Cath and Coronary Angiography;  Surgeon: [name], MD;  Location: [facility];  Service: Cardiovascular;  Laterality: N/A;   CARDIAC CATHETERIZATION  05-21-1995  dr Verdis Prime   non-obstructive cad;  normal lvf   CARDIOVASCULAR STRESS TEST  09/23/2015  dr Gloris Manchester turner   Normal nuclear study w/ no prior infarct and no ischemia/  normal LV function and wall motion , nuclear stress ef 54%   COLONOSCOPY  last one 2009   CORONARY ARTERY BYPASS GRAFT N/A 11/04/2015   Procedure: CORONARY ARTERY BYPASS GRAFTING (CABG) times three using left internal mammary artery and right saphenous vein harvested with endoscope.;  Surgeon: [name], MD;  Location: [facility];  Service: Open Heart Surgery;  Laterality: N/A;   CYSTOSCOPY W/  URETERAL STENT PLACEMENT Right 05/02/2022   Procedure: CYSTOSCOPY WITH RETROGRADE PYELOGRAM/URETERAL STENT PLACEMENT;  Surgeon: Rene Paci, MD;  Location: [facility];  Service: Urology;  Laterality: Right;  30 MINS   CYSTOSCOPY WITH RETROGRADE PYELOGRAM, URETEROSCOPY AND STENT PLACEMENT Left 03/09/2017   Procedure: CYSTOSCOPY WITH RETROGRADE PYELOGRAM, URETEROSCOPY AND STENT PLACEMENT;  Surgeon: Sebastian Ache, MD;  Location: [facility];  Service: Urology;  Laterality: Left;   CYSTOSCOPY WITH URETEROSCOPY  07/28/2012   Procedure: CYSTOSCOPY WITH URETEROSCOPY;  Surgeon: [name], MD;  Location: [facility];  Service: Urology;  Laterality: Left;      CYSTOSCOPY/URETEROSCOPY/HOLMIUM LASER/STENT PLACEMENT Right 03/25/2017   Procedure: CYSTOSCOPY RIGHT URETEROSCOPYVHOLMIUM LASER STENT PLACEMENT;  Surgeon: Crista Elliot, MD;  Location: [facility];  Service: Urology;  Laterality: Right;   ESOPHAGOGASTRODUODENOSCOPY (EGD) WITH ESOPHAGEAL DILATION  05/ 2018 approx.   EXTRACORPOREAL SHOCK WAVE LITHOTRIPSY  multiple last one 05-29-2012   FOOT SURGERY  1990s   right-removed bone spur    HOLMIUM LASER APPLICATION  07/28/2012   Procedure: HOLMIUM LASER APPLICATION;  Surgeon: [name], MD;  Location: [facility];  Service: Urology;  Laterality: Left;  left lithotripsy   HOLMIUM LASER APPLICATION Left 03/09/2017   Procedure: HOLMIUM LASER APPLICATION;  Surgeon: Sebastian Ache, MD;  Location: [facility];  Service: Urology;  Laterality: Left;   LAPAROSCOPIC APPENDECTOMY N/A 12/20/2018   Procedure: APPENDECTOMY LAPAROSCOPIC;  Surgeon: Axel Filler, MD;  Location: [facility];  Service: General;  Laterality: N/A;   POSTERIOR LUMBAR FUSION  03/07/2012   w/ laminecotmy L4-5   PYELOLITHOTOMY Right 01/1982   TEE WITHOUT CARDIOVERSION N/A 11/04/2015   Procedure: TRANSESOPHAGEAL ECHOCARDIOGRAM (TEE);  Surgeon: [name], MD;  Location: [facility];  Service: Open Heart Surgery;   Laterality: N/A;   TONSILLECTOMY  age 89   URETEROLITHOTOMY Right 03/1994    Allergies  Allergies  Allergen Reactions   Atorvastatin Other (See Comments)    Reaction: dizziness/ dyspepsia. Muscle cramps all over body. Other reaction(s): dizziness and dyspepsia   Hydrochlorothiazide Other (See Comments)    Reaction: arthralgias/ skin burning Other reaction(s): arthralgias/skin burning   Rosuvastatin Other (See Comments)    Reaction: muscle cramping Other reaction(s): hand cramping    Home Medications    Prior to Admission medications   Medication Sig Start Date End Date Taking? Authorizing Provider  allopurinol (ZYLOPRIM) 100 MG tablet Take 100 mg by mouth 2 (two) times daily.     [provider]  amLODipine (NORVASC) 10 MG tablet Take 1 tablet (10 mg total) by mouth daily. 05/30/22   Quintella Reichert, MD  aspirin EC 81 MG EC tablet Take 1 tablet (81 mg total) by mouth daily. 01/19/16   Quintella Reichert, MD  Coenzyme Q10 (COQ10) 100  MG CAPS Take 100 mg by mouth every evening.    [provider]  fexofenadine (ALLEGRA) 180 MG tablet Take 180 mg by mouth daily. 02/11/15   [provider]  metoprolol tartrate (LOPRESSOR) 25 MG tablet TAKE 1 TABLET(25 MG) BY MOUTH TWICE DAILY Patient not taking: Reported on 08/26/2023 06/19/23   Quintella Reichert, MD  Multiple Vitamin (MULTIVITAMIN WITH MINERALS) TABS Take 1 tablet by mouth every morning.     [provider]  nitroGLYCERIN (NITROSTAT) 0.4 MG SL tablet Place 1 tablet (0.4 mg total) under the tongue every 5 (five) minutes as needed for chest pain. 06/18/23 09/16/23  Dyann Kief, PA-C  pantoprazole (PROTONIX) 40 MG tablet TAKE 1 TABLET(40 MG) BY MOUTH TWICE DAILY 10/08/22   Quintella Reichert, MD  pravastatin (PRAVACHOL) 20 MG tablet Take 1 tablet (20 mg total) by mouth every evening. 06/18/23   Dyann Kief, PA-C  REPATHA SURECLICK 140 MG/ML SOAJ INJECT THE CONTENTS INTO THE SKIN EVERY 14 DAYS 04/01/23   Quintella Reichert, MD  telmisartan (MICARDIS) 20 MG tablet TAKE 1 TABLET(20 MG) BY MOUTH EVERY MORNING 06/26/23   Quintella Reichert, MD  VASCEPA 1 g capsule TAKE 2 CAPSULES(2 GRAMS) BY MOUTH TWICE DAILY 07/30/23   Quintella Reichert, MD    Physical Exam    Vital Signs:  [name] [name] does not have vital signs available for review today.  Given telephonic nature of communication, physical exam is limited. AAOx3. NAD. Normal affect.  Speech and respirations are unlabored.  Accessory Clinical Findings    None  Assessment & Plan    1.  Preoperative Cardiovascular Risk Assessment:  According to the Revised Cardiac Risk Index (RCRI), his Perioperative Risk of Major Cardiac Event is (%): 0.9  His Functional Capacity in METs is: 8.97 according to the [facility] Activity Status Index (DASI).   Per office protocol, if patient is without any new symptoms or concerns at the time of their virtual visit, he may hold ASA for 7 days prior to procedure. Please resume ASA as soon as possible postprocedure, at the discretion of the surgeon.    The patient was advised that if he develops new symptoms prior to surgery to contact our office to arrange for a follow-up visit, and he verbalized understanding.  Therefore, based on ACC/AHA guidelines, patient would be at acceptable risk for the planned procedure without further cardiovascular testing. I will route this recommendation to the requesting party via Epic fax function.    A copy of this note will be routed to requesting surgeon.  Time:   Today, I have spent 10 minutes with the patient with telehealth technology discussing medical history, symptoms, and management plan.     [name] Reining, NP  09/06/2023, 9:00 AM

## 2023-09-06 ENCOUNTER — Ambulatory Visit: Payer: Medicare Other | Attending: Cardiology

## 2023-09-06 DIAGNOSIS — Z01818 Encounter for other preprocedural examination: Secondary | ICD-10-CM | POA: Diagnosis not present

## 2023-09-06 NOTE — Addendum Note (Signed)
 Addended by: Jodelle Gross on: 09/06/2023 10:13 AM   Modules accepted: Level of Service

## 2023-09-22 ENCOUNTER — Other Ambulatory Visit: Payer: Self-pay | Admitting: Cardiology

## 2023-09-22 DIAGNOSIS — K219 Gastro-esophageal reflux disease without esophagitis: Secondary | ICD-10-CM

## 2023-09-26 ENCOUNTER — Other Ambulatory Visit: Payer: Self-pay | Admitting: Cardiology

## 2023-10-22 ENCOUNTER — Other Ambulatory Visit: Payer: Self-pay

## 2023-10-22 ENCOUNTER — Encounter (HOSPITAL_BASED_OUTPATIENT_CLINIC_OR_DEPARTMENT_OTHER): Payer: Self-pay | Admitting: Orthopedic Surgery

## 2023-10-23 ENCOUNTER — Encounter (HOSPITAL_BASED_OUTPATIENT_CLINIC_OR_DEPARTMENT_OTHER)
Admission: RE | Admit: 2023-10-23 | Discharge: 2023-10-23 | Disposition: A | Discharge status: Home / Self Care | Source: Ambulatory Visit | Attending: Orthopedic Surgery | Admitting: Orthopedic Surgery

## 2023-10-23 DIAGNOSIS — N1831 Chronic kidney disease, stage 3a: Secondary | ICD-10-CM | POA: Diagnosis not present

## 2023-10-23 DIAGNOSIS — Z01818 Encounter for other preprocedural examination: Secondary | ICD-10-CM | POA: Diagnosis present

## 2023-10-23 DIAGNOSIS — Z01812 Encounter for preprocedural laboratory examination: Secondary | ICD-10-CM | POA: Diagnosis not present

## 2023-10-23 LAB — BASIC METABOLIC PANEL WITH GFR
Anion gap: 9 (ref 5–15)
BUN: 14 mg/dL (ref 8–23)
CO2: 25 mmol/L (ref 22–32)
Calcium: 9.3 mg/dL (ref 8.9–10.3)
Chloride: 107 mmol/L (ref 98–111)
Creatinine, Ser: 1.71 mg/dL — ABNORMAL HIGH (ref 0.61–1.24)
GFR, Estimated: 41 mL/min — ABNORMAL LOW (ref >60)
Glucose, Bld: 103 mg/dL — ABNORMAL HIGH (ref 70–99)
Potassium: 4.4 mmol/L (ref 3.5–5.1)
Sodium: 141 mmol/L (ref 135–145)

## 2023-10-28 NOTE — H&P (Signed)
 PREOPERATIVE H&P  Chief Complaint: left knee pain   HPI: [name] [name] is a 78 y.o. male who presents for left knee pain. Medial knee pain has been ongoing since January. He is an avid Teacher, English as a foreign language. Cortisone injection really did not help very long.  Symptoms are rated as moderate to severe, and have been worsening.  This is significantly impairing activities of daily living.  He has elected for surgical management.   Past Medical History:  Diagnosis Date   BPH associated with nocturia    CAD (coronary artery disease), native coronary artery    CARDIOLOGIST-  DR Sophia Dustman TURNER--  normal myoview 03/ 2017 but Cardiac CT 04/ 2017 calcium score 1951, >70% LAD stenosis possible disease RCA , LCX--- cath done showed severe 3V disease S/P  CABG 04/ 2017   Chronic back pain    herniated disc   CKD (chronic kidney disease), stage III (HCC)    Diverticulosis    GERD (gastroesophageal reflux disease)    Gout    as of 03-20-2017 per pt stable   History of kidney stones    METABOLIC STONE DISEASE   History of urinary retention    POST OP SURGERY 08/ 2013   Hypercalciuria    Hyperlipidemia    Hypertension    LBBB (left bundle branch block)    Muscle cramps    legs occasonally in am   Renal calculus, right    Renal cyst, right    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   LIMA to LAD, Sequential SVG to OM1-OM2, EVH via right thigh   Seasonal allergies    Past Surgical History:  Procedure Laterality Date   CARDIAC CATHETERIZATION N/A 10/28/2015   Procedure: Left Heart Cath and Coronary Angiography;  Surgeon: Arty Binning, MD;  Location: [facility];  Service: Cardiovascular;  Laterality: N/A;   CARDIAC CATHETERIZATION  05-21-1995  dr Kay Parson   non-obstructive cad;  normal lvf   CARDIOVASCULAR STRESS TEST  09/23/2015  dr Sophia Dustman turner   Normal nuclear study w/ no prior infarct and no ischemia/  normal LV function and wall motion , nuclear stress ef 54%   COLONOSCOPY  last one 2009   CORONARY ARTERY  BYPASS GRAFT N/A 11/04/2015   Procedure: CORONARY ARTERY BYPASS GRAFTING (CABG) times three using left internal mammary artery and right saphenous vein harvested with endoscope.;  Surgeon: Gardenia Jump, MD;  Location: [facility];  Service: Open Heart Surgery;  Laterality: N/A;   CYSTOSCOPY W/ URETERAL STENT PLACEMENT Right 05/02/2022   Procedure: CYSTOSCOPY WITH RETROGRADE PYELOGRAM/URETERAL STENT PLACEMENT;  Surgeon: Adelbert Homans, MD;  Location: [facility];  Service: Urology;  Laterality: Right;  30 MINS   CYSTOSCOPY WITH RETROGRADE PYELOGRAM, URETEROSCOPY AND STENT PLACEMENT Left 03/09/2017   Procedure: CYSTOSCOPY WITH RETROGRADE PYELOGRAM, URETEROSCOPY AND STENT PLACEMENT;  Surgeon: Osborn Blaze, MD;  Location: [facility];  Service: Urology;  Laterality: Left;   CYSTOSCOPY WITH URETEROSCOPY  07/28/2012   Procedure: CYSTOSCOPY WITH URETEROSCOPY;  Surgeon: Kristeen Peto, MD;  Location: [facility];  Service: Urology;  Laterality: Left;      CYSTOSCOPY/URETEROSCOPY/HOLMIUM LASER/STENT PLACEMENT Right 03/25/2017   Procedure: CYSTOSCOPY RIGHT URETEROSCOPYVHOLMIUM LASER STENT PLACEMENT;  Surgeon: Samson Croak, MD;  Location: [facility];  Service: Urology;  Laterality: Right;   ESOPHAGOGASTRODUODENOSCOPY (EGD) WITH ESOPHAGEAL DILATION  05/ 2018 approx.   EXTRACORPOREAL SHOCK WAVE LITHOTRIPSY  multiple last one 05-29-2012   FOOT SURGERY  1990s   right-removed bone  spur    HOLMIUM LASER APPLICATION  07/28/2012   Procedure: HOLMIUM LASER APPLICATION;  Surgeon: [name], MD;  Location: [facility];  Service: Urology;  Laterality: Left;  left lithotripsy   HOLMIUM LASER APPLICATION Left 03/09/2017   Procedure: HOLMIUM LASER APPLICATION;  Surgeon: Sebastian Ache, MD;  Location: [facility];  Service: Urology;  Laterality: Left;   LAPAROSCOPIC APPENDECTOMY N/A 12/20/2018   Procedure: APPENDECTOMY LAPAROSCOPIC;  Surgeon: Axel Filler, MD;  Location: [facility];  Service: General;   Laterality: N/A;   POSTERIOR LUMBAR FUSION  03/07/2012   w/ laminecotmy L4-5   PYELOLITHOTOMY Right 01/1982   TEE WITHOUT CARDIOVERSION N/A 11/04/2015   Procedure: TRANSESOPHAGEAL ECHOCARDIOGRAM (TEE);  Surgeon: [name], MD;  Location[facility];  Service: Open Heart Surgery;  Laterality: N/A;   TONSILLECTOMY  age 8   URETEROLITHOTOMY Right 03/1994   Social History   Socioeconomic History   Marital status: Widowed    Spouse name: Not on file   Number of children: Not on file   Years of education: Not on file   Highest education level: Not on file  Occupational History   Not on file  Tobacco Use   Smoking status: Never   Smokeless tobacco: Never  Vaping Use   Vaping status: Never Used  Substance and Sexual Activity   Alcohol use: No    Alcohol/week: 0.0 standard drinks of alcohol   Drug use: No   Sexual activity: Not Currently    Comment: vasectomy 1980s  Other Topics Concern   Not on file  Social History Narrative   Not on file   Social Drivers of Health   Financial Resource Strain: Not on file  Food Insecurity: Not on file  Transportation Needs: Not on file  Physical Activity: Not on file  Stress: Not on file  Social Connections: Not on file   Family History  Problem Relation Age of Onset   Dementia Mother    Prostate cancer Father    Allergies  Allergen Reactions   Atorvastatin Other (See Comments)    Reaction: dizziness/ dyspepsia. Muscle cramps all over body. Other reaction(s): dizziness and dyspepsia   Hydrochlorothiazide Other (See Comments)    Reaction: arthralgias/ skin burning Other reaction(s): arthralgias/skin burning   Rosuvastatin Other (See Comments)    Reaction: muscle cramping Other reaction(s): hand cramping   Prior to Admission medications   Medication Sig Start Date End Date Taking? Authorizing Provider  allopurinol (ZYLOPRIM) 100 MG tablet Take 100 mg by mouth 2 (two) times daily.    Yes [provider]  amLODipine  (NORVASC) 10 MG tablet Take 1 tablet (10 mg total) by mouth daily. 05/30/22  Yes Quintella Reichert, MD  aspirin EC 81 MG EC tablet Take 1 tablet (81 mg total) by mouth daily. 01/19/16  Yes Turner, Cornelious Bryant, MD  cholecalciferol (VITAMIN D3) 25 MCG (1000 UNIT) tablet Take 1,000 Units by mouth daily.   Yes [provider]  Coenzyme Q10 (COQ10) 100 MG CAPS Take 100 mg by mouth every evening.   Yes [provider]  fexofenadine (ALLEGRA) 180 MG tablet Take 180 mg by mouth daily. 02/11/15  Yes [provider]  icosapent Ethyl (VASCEPA) 1 g capsule TAKE 2 CAPSULES(2 GRAMS) BY MOUTH TWICE DAILY 09/26/23  Yes Turner, Cornelious Bryant, MD  Multiple Vitamin (MULTIVITAMIN WITH MINERALS) TABS Take 1 tablet by mouth every morning.    Yes [provider]  pantoprazole (PROTONIX) 40 MG tablet TAKE 1 TABLET(40 MG) BY MOUTH  TWICE DAILY 09/23/23  Yes Turner, Rufus Council, MD  pravastatin (PRAVACHOL) 20 MG tablet Take 1 tablet (20 mg total) by mouth every evening. 06/18/23  Yes Flo Hummingbird, PA-C  REPATHA SURECLICK 140 MG/ML SOAJ INJECT THE CONTENTS INTO THE SKIN EVERY 14 DAYS 04/01/23  Yes Turner, Traci R, MD  telmisartan (MICARDIS) 20 MG tablet TAKE 1 TABLET(20 MG) BY MOUTH EVERY MORNING 06/26/23  Yes Turner, Traci R, MD  metoprolol tartrate (LOPRESSOR) 25 MG tablet TAKE 1 TABLET(25 MG) BY MOUTH TWICE DAILY Patient not taking: Reported on 08/26/2023 06/19/23   Jacqueline Matsu, MD  nitroGLYCERIN (NITROSTAT) 0.4 MG SL tablet Place 1 tablet (0.4 mg total) under the tongue every 5 (five) minutes as needed for chest pain. 06/18/23 09/16/23  Flo Hummingbird, PA-C     Positive ROS: All other systems have been reviewed and were otherwise negative with the exception of those mentioned in the HPI and as above.  Physical Exam: General: Alert, no acute distress Cardiovascular: No pedal edema Respiratory: No cyanosis, no use of accessory musculature GI: No organomegaly, abdomen is soft and non-tender Skin: No  lesions in the area of chief complaint Neurologic: Sensation intact distally Psychiatric: Patient is competent for consent with normal mood and affect Lymphatic: No axillary or cervical lymphadenopathy  MUSCULOSKELETAL: On exam he has medial joint line tenderness with nearly full motion.  Minimal effusion.  MRI demonstrates a medial meniscus tear.  Minimal chondral changes.   Assessment: Left medial meniscus tear   Plan: Plan for Procedure(s): ARTHROSCOPY, KNEE, WITH MEDIAL MENISCECTOMY  The risks benefits and alternatives were discussed with the patient including but not limited to the risks of nonoperative treatment, versus surgical intervention including infection, bleeding, nerve injury,  blood clots, cardiopulmonary complications, morbidity, mortality, among others, and they were willing to proceed.   Vera Furniss K Heber Hoog, PA-C    10/28/2023 10:41 AM

## 2023-10-28 NOTE — Anesthesia Preprocedure Evaluation (Signed)
 Anesthesia Evaluation  Patient identified by MRN, date of birth, ID band Patient awake    Reviewed: Allergy & Precautions, NPO status , Patient's Chart, lab work & pertinent test results  Airway Mallampati: III  TM Distance: >3 FB     Dental no notable dental hx. (+) Loose, Caps   Pulmonary neg pulmonary ROS   Pulmonary exam normal breath sounds clear to auscultation       Cardiovascular hypertension, Pt. on medications + CAD and + CABG  Normal cardiovascular exam+ dysrhythmias  Rhythm:Regular Rate:Normal  CABG x 3 10/2015  EKG 06/18/23 NSR, LBBB pattern   Neuro/Psych Lumbar radiculopathy Hx/o myositis  Neuromuscular disease  negative psych ROS   GI/Hepatic Neg liver ROS,GERD  Medicated,,  Endo/Other  Obesity HLD Gout  Renal/GU Renal InsufficiencyRenal diseaseHx/o renal cyst CKD stage 3 Hx/o renal calculi  negative genitourinary   Musculoskeletal Torn medial meniscus left knee Hx/o Lumbar PLIF   Abdominal  (+) + obese  Peds  Hematology  (+) Blood dyscrasia, anemia   Anesthesia Other Findings   Reproductive/Obstetrics                              Anesthesia Physical Anesthesia Plan  ASA: 3  Anesthesia Plan: General   Post-op Pain Management: Minimal or no pain anticipated   Induction: Intravenous  PONV Risk Score and Plan: 3 and Treatment may vary due to age or medical condition, Ondansetron and Dexamethasone  Airway Management Planned: LMA  Additional Equipment: None  Intra-op Plan:   Post-operative Plan: Extubation in OR  Informed Consent: I have reviewed the patients History and Physical, chart, labs and discussed the procedure including the risks, benefits and alternatives for the proposed anesthesia with the patient or authorized representative who has indicated his/her understanding and acceptance.     Dental advisory given  Plan Discussed with: CRNA and  Anesthesiologist  Anesthesia Plan Comments:          Anesthesia Quick Evaluation

## 2023-10-29 ENCOUNTER — Ambulatory Visit (HOSPITAL_BASED_OUTPATIENT_CLINIC_OR_DEPARTMENT_OTHER): Payer: Self-pay | Admitting: Anesthesiology

## 2023-10-29 ENCOUNTER — Encounter (HOSPITAL_BASED_OUTPATIENT_CLINIC_OR_DEPARTMENT_OTHER): Payer: Self-pay | Admitting: Orthopedic Surgery

## 2023-10-29 ENCOUNTER — Ambulatory Visit (HOSPITAL_BASED_OUTPATIENT_CLINIC_OR_DEPARTMENT_OTHER)
Admission: RE | Admit: 2023-10-29 | Discharge: 2023-10-29 | Disposition: A | Discharge status: Home / Self Care | Payer: Medicare Other | Attending: Orthopedic Surgery | Admitting: Orthopedic Surgery

## 2023-10-29 ENCOUNTER — Other Ambulatory Visit: Payer: Self-pay

## 2023-10-29 ENCOUNTER — Encounter (HOSPITAL_BASED_OUTPATIENT_CLINIC_OR_DEPARTMENT_OTHER)
Admission: RE | Disposition: A | Discharge status: Home / Self Care | Payer: Self-pay | Source: Home / Self Care | Attending: Orthopedic Surgery

## 2023-10-29 DIAGNOSIS — I447 Left bundle-branch block, unspecified: Secondary | ICD-10-CM | POA: Insufficient documentation

## 2023-10-29 DIAGNOSIS — X58XXXA Exposure to other specified factors, initial encounter: Secondary | ICD-10-CM | POA: Insufficient documentation

## 2023-10-29 DIAGNOSIS — Z951 Presence of aortocoronary bypass graft: Secondary | ICD-10-CM | POA: Diagnosis not present

## 2023-10-29 DIAGNOSIS — M109 Gout, unspecified: Secondary | ICD-10-CM | POA: Diagnosis not present

## 2023-10-29 DIAGNOSIS — E785 Hyperlipidemia, unspecified: Secondary | ICD-10-CM | POA: Diagnosis not present

## 2023-10-29 DIAGNOSIS — Z7982 Long term (current) use of aspirin: Secondary | ICD-10-CM | POA: Insufficient documentation

## 2023-10-29 DIAGNOSIS — M5416 Radiculopathy, lumbar region: Secondary | ICD-10-CM | POA: Diagnosis not present

## 2023-10-29 DIAGNOSIS — S83232A Complex tear of medial meniscus, current injury, left knee, initial encounter: Secondary | ICD-10-CM

## 2023-10-29 DIAGNOSIS — Z6835 Body mass index (BMI) 35.0-35.9, adult: Secondary | ICD-10-CM | POA: Diagnosis not present

## 2023-10-29 DIAGNOSIS — I129 Hypertensive chronic kidney disease with stage 1 through stage 4 chronic kidney disease, or unspecified chronic kidney disease: Secondary | ICD-10-CM | POA: Diagnosis not present

## 2023-10-29 DIAGNOSIS — I2510 Atherosclerotic heart disease of native coronary artery without angina pectoris: Secondary | ICD-10-CM

## 2023-10-29 DIAGNOSIS — K219 Gastro-esophageal reflux disease without esophagitis: Secondary | ICD-10-CM | POA: Insufficient documentation

## 2023-10-29 DIAGNOSIS — D649 Anemia, unspecified: Secondary | ICD-10-CM | POA: Diagnosis not present

## 2023-10-29 DIAGNOSIS — N183 Chronic kidney disease, stage 3 unspecified: Secondary | ICD-10-CM | POA: Diagnosis not present

## 2023-10-29 DIAGNOSIS — N1831 Chronic kidney disease, stage 3a: Secondary | ICD-10-CM

## 2023-10-29 DIAGNOSIS — Z79899 Other long term (current) drug therapy: Secondary | ICD-10-CM | POA: Insufficient documentation

## 2023-10-29 DIAGNOSIS — E669 Obesity, unspecified: Secondary | ICD-10-CM | POA: Diagnosis not present

## 2023-10-29 SURGERY — ARTHROSCOPY, KNEE, WITH MEDIAL MENISCECTOMY
Anesthesia: General | Site: Knee | Laterality: Left

## 2023-10-29 MED ORDER — CEFAZOLIN SODIUM-DEXTROSE 2-4 GM/100ML-% IV SOLN
INTRAVENOUS | Status: AC
  Filled 2023-10-29: qty 100

## 2023-10-29 MED ORDER — ONDANSETRON HCL 4 MG/2ML IJ SOLN: 4.0000 mg | Freq: Once | INTRAMUSCULAR | Status: DC | PRN

## 2023-10-29 MED ORDER — FENTANYL CITRATE (PF) 100 MCG/2ML IJ SOLN: 25.0000 ug | INTRAMUSCULAR | Status: DC | PRN

## 2023-10-29 MED ORDER — DEXAMETHASONE SODIUM PHOSPHATE 10 MG/ML IJ SOLN
INTRAMUSCULAR | Status: DC | PRN
  Administered 2023-10-29: 6 mg via INTRAVENOUS

## 2023-10-29 MED ORDER — BUPIVACAINE HCL (PF) 0.5 % IJ SOLN
INTRAMUSCULAR | Status: AC
  Filled 2023-10-29: qty 30

## 2023-10-29 MED ORDER — OXYCODONE HCL 5 MG/5ML PO SOLN: 5.0000 mg | Freq: Once | ORAL | Status: DC | PRN

## 2023-10-29 MED ORDER — CEFAZOLIN SODIUM-DEXTROSE 2-4 GM/100ML-% IV SOLN
2.0000 g | INTRAVENOUS | Status: AC
  Administered 2023-10-29: 2 g via INTRAVENOUS

## 2023-10-29 MED ORDER — PHENYLEPHRINE HCL (PRESSORS) 10 MG/ML IV SOLN
INTRAVENOUS | Status: DC | PRN
  Administered 2023-10-29 (×2): 160 ug via INTRAVENOUS
  Administered 2023-10-29 (×2): 80 ug via INTRAVENOUS

## 2023-10-29 MED ORDER — HYDROCODONE-ACETAMINOPHEN 5-325 MG PO TABS: 1.0000 | ORAL_TABLET | ORAL | 0 refills | Status: DC | PRN

## 2023-10-29 MED ORDER — FENTANYL CITRATE (PF) 100 MCG/2ML IJ SOLN
INTRAMUSCULAR | Status: DC | PRN
  Administered 2023-10-29 (×2): 50 ug via INTRAVENOUS

## 2023-10-29 MED ORDER — SODIUM CHLORIDE 0.9 % IR SOLN
Status: DC | PRN
  Administered 2023-10-29: 3000 mL

## 2023-10-29 MED ORDER — ONDANSETRON HCL 4 MG/2ML IJ SOLN
INTRAMUSCULAR | Status: AC
  Filled 2023-10-29: qty 2

## 2023-10-29 MED ORDER — BUPIVACAINE HCL (PF) 0.5 % IJ SOLN
INTRAMUSCULAR | Status: DC | PRN
  Administered 2023-10-29: 20 mL

## 2023-10-29 MED ORDER — POVIDONE-IODINE 7.5 % EX SOLN
Freq: Once | CUTANEOUS | Status: DC
  Filled 2023-10-29: qty 118

## 2023-10-29 MED ORDER — DEXAMETHASONE SODIUM PHOSPHATE 10 MG/ML IJ SOLN
INTRAMUSCULAR | Status: AC
  Filled 2023-10-29: qty 1

## 2023-10-29 MED ORDER — ACETAMINOPHEN 500 MG PO TABS
ORAL_TABLET | ORAL | Status: AC
  Filled 2023-10-29: qty 2

## 2023-10-29 MED ORDER — ACETAMINOPHEN 500 MG PO TABS
1000.0000 mg | ORAL_TABLET | Freq: Once | ORAL | Status: AC
  Administered 2023-10-29: 1000 mg via ORAL

## 2023-10-29 MED ORDER — LACTATED RINGERS IV SOLN: INTRAVENOUS | Status: DC

## 2023-10-29 MED ORDER — FENTANYL CITRATE (PF) 100 MCG/2ML IJ SOLN
INTRAMUSCULAR | Status: AC
Start: 2023-10-29 — End: ?
  Filled 2023-10-29: qty 2

## 2023-10-29 MED ORDER — ONDANSETRON HCL 4 MG/2ML IJ SOLN
INTRAMUSCULAR | Status: DC | PRN
  Administered 2023-10-29: 4 mg via INTRAVENOUS

## 2023-10-29 MED ORDER — DEXMEDETOMIDINE HCL IN NACL 80 MCG/20ML IV SOLN
INTRAVENOUS | Status: DC | PRN
  Administered 2023-10-29: 8 ug via INTRAVENOUS

## 2023-10-29 MED ORDER — LIDOCAINE 2% (20 MG/ML) 5 ML SYRINGE
INTRAMUSCULAR | Status: DC | PRN
  Administered 2023-10-29: 100 mg via INTRAVENOUS

## 2023-10-29 MED ORDER — PROPOFOL 10 MG/ML IV BOLUS
INTRAVENOUS | Status: DC | PRN
  Administered 2023-10-29: 15 mg via INTRAVENOUS

## 2023-10-29 MED ORDER — GLYCOPYRROLATE PF 0.2 MG/ML IJ SOSY
PREFILLED_SYRINGE | INTRAMUSCULAR | Status: AC
  Filled 2023-10-29: qty 1

## 2023-10-29 MED ORDER — POVIDONE-IODINE 10 % EX SWAB: 2.0000 | Freq: Once | CUTANEOUS | Status: DC

## 2023-10-29 MED ORDER — LIDOCAINE 2% (20 MG/ML) 5 ML SYRINGE
INTRAMUSCULAR | Status: AC
  Filled 2023-10-29: qty 5

## 2023-10-29 MED ORDER — OXYCODONE HCL 5 MG PO TABS: 5.0000 mg | ORAL_TABLET | Freq: Once | ORAL | Status: DC | PRN

## 2023-10-29 SURGICAL SUPPLY — 30 items
BNDG ELASTIC 6INX 5YD STR LF (GAUZE/BANDAGES/DRESSINGS) ×1 IMPLANT
CLSR STERI-STRIP ANTIMIC 1/2X4 (GAUZE/BANDAGES/DRESSINGS) ×1 IMPLANT
DISSECTOR 3.8MM X 13CM (MISCELLANEOUS) ×1 IMPLANT
DISSECTOR 4.0MM X 13CM (MISCELLANEOUS) IMPLANT
DRAPE ARTHROSCOPY W/POUCH 90 (DRAPES) ×1 IMPLANT
DRAPE IMP U-DRAPE 54X76 (DRAPES) ×1 IMPLANT
DURAPREP 26ML APPLICATOR (WOUND CARE) ×1 IMPLANT
ELECT REM PT RETURN 9FT ADLT (ELECTROSURGICAL) IMPLANT
ELECTRODE REM PT RTRN 9FT ADLT (ELECTROSURGICAL) IMPLANT
GAUZE SPONGE 4X4 12PLY STRL (GAUZE/BANDAGES/DRESSINGS) ×1 IMPLANT
GLOVE BIO SURGEON STRL SZ7 (GLOVE) ×1 IMPLANT
GLOVE BIOGEL PI IND STRL 7.0 (GLOVE) ×1 IMPLANT
GLOVE BIOGEL PI IND STRL 8 (GLOVE) ×2 IMPLANT
GLOVE ORTHO TXT STRL SZ7.5 (GLOVE) ×1 IMPLANT
GOWN STRL REUS W/ TWL LRG LVL3 (GOWN DISPOSABLE) ×1 IMPLANT
GOWN STRL REUS W/ TWL XL LVL3 (GOWN DISPOSABLE) ×2 IMPLANT
MANIFOLD NEPTUNE II (INSTRUMENTS) ×1 IMPLANT
PACK ARTHROSCOPY DSU (CUSTOM PROCEDURE TRAY) ×1 IMPLANT
PACK BASIN DAY SURGERY FS (CUSTOM PROCEDURE TRAY) ×1 IMPLANT
PENCIL SMOKE EVACUATOR (MISCELLANEOUS) IMPLANT
SLEEVE SCD COMPRESS KNEE MED (STOCKING) IMPLANT
SOL .9 NS 3000ML IRR UROMATIC (IV SOLUTION) ×2 IMPLANT
SUT MNCRL 4-0 27 PS-2 XMFL (SUTURE) ×1 IMPLANT
SUT MNCRL AB 4-0 PS2 18 (SUTURE) ×1 IMPLANT
SUTURE MNCRL 4-0 27XMF (SUTURE) IMPLANT
TOWEL GREEN STERILE FF (TOWEL DISPOSABLE) ×1 IMPLANT
TUBING ARTHROSCOPY IRRIG 16FT (MISCELLANEOUS) ×1 IMPLANT
WAND ABLATOR APOLLO I90 (BUR) IMPLANT
WATER STERILE IRR 1000ML POUR (IV SOLUTION) ×1 IMPLANT
WRAP KNEE MAXI GEL POST OP (GAUZE/BANDAGES/DRESSINGS) ×1 IMPLANT

## 2023-10-29 NOTE — Op Note (Signed)
 10/29/2023  8:12 AM  PATIENT:  [name] [name]    PRE-OPERATIVE DIAGNOSIS:  LEFT MEDIAL MENISCUS TEAR  POST-OPERATIVE DIAGNOSIS:  Same  PROCEDURE:  ARTHROSCOPY, KNEE, WITH MEDIAL MENISCECTOMY  SURGEON:  Neville Barbone, MD  PHYSICIAN ASSISTANT: Hurshel Maidens, PA-C, present and scrubbed throughout the case, critical for completion in a timely fashion, and for retraction, instrumentation, and closure.  ANESTHESIA:   General  PREOPERATIVE INDICATIONS:  [name] [name] is a  78 y.o. male with a diagnosis of LEFT MEDIAL MENISCUS TEAR who failed conservative measures and elected for surgical management.    The risks benefits and alternatives were discussed with the patient preoperatively including but not limited to the risks of infection, bleeding, nerve injury, cardiopulmonary complications, the need for revision surgery, among others, and the patient was willing to proceed.  ESTIMATED BLOOD LOSS: Minimal  OPERATIVE IMPLANTS:   * No implants in log *  OPERATIVE FINDINGS: Extensive grade 2 and maybe some grade 3 changes on the undersurface of the patella with grade 2 changes on the femoral trochlea.  The medial compartment had grade 2 changes on both the tibia and femur.  There was a complex medial meniscus tear with a flap incarcerated in the gutter.  The lateral compartment had intact articular cartilage as well as intact lateral meniscus.  The ACL was intact.  OPERATIVE PROCEDURE: The patient was brought to the operating room and placed in the supine position.  General anesthesia was administered.  IV antibiotics were given.  The left lower extremity was prepped and draped in usual sterile fashion.  Timeout performed.  Diagnostic arthroscopy carried out the above-named findings.  I used the arthroscopic shaver and the arthroscopic basket to debride the medial meniscus back to a stable configuration, and there was a chunk of torn meniscus that was incarcerated within the medial gutter,  superiorly, which I removed.  The root was overall intact but it did look like there was a cleavage tear at the location of the root which I trimmed back to a stable configuration.  Complete debridement was carried out, the instruments were removed, the portals closed with Monocryl after the knee was drained.  Sterile gauze was applied.  He was awakened and returned to the PACU in stable and satisfactory condition.  There were no complications and he tolerated the procedure well.  [name], MD

## 2023-10-29 NOTE — Interval H&P Note (Signed)
 History and Physical Interval Note:  10/29/2023 7:16 AM  [name] [name]  has presented today for surgery, with the diagnosis of LEFT MEDIAL MENISCUS TEAR.  The various methods of treatment have been discussed with the patient and family. After consideration of risks, benefits and other options for treatment, the patient has consented to  Procedure(s): ARTHROSCOPY, KNEE, WITH MEDIAL MENISCECTOMY (Left) as a surgical intervention.  The patient's history has been reviewed, patient examined, no change in status, stable for surgery.  I have reviewed the patient's chart and labs.  Questions were answered to the patient's satisfaction.     Neville Barbone

## 2023-10-29 NOTE — Anesthesia Procedure Notes (Signed)
 Procedure Name: LMA Insertion Date/Time: 10/29/2023 7:35 AM  Performed by: Darcel Early, CRNAPre-anesthesia Checklist: Patient identified, Emergency Drugs available, Suction available, Patient being monitored and Timeout performed Patient Re-evaluated:Patient Re-evaluated prior to induction Oxygen Delivery Method: Circle system utilized Preoxygenation: Pre-oxygenation with 100% oxygen Induction Type: IV induction Ventilation: Mask ventilation without difficulty LMA: LMA inserted LMA Size: 5.0 Number of attempts: 1 Placement Confirmation: positive ETCO2 Dental Injury: Teeth and Oropharynx as per pre-operative assessment

## 2023-10-29 NOTE — Transfer of Care (Signed)
 Immediate Anesthesia Transfer of Care Note  Patient: [name] [name]  Procedure(s) Performed: ARTHROSCOPY, KNEE, WITH MEDIAL MENISCECTOMY (Left: Knee)  Patient Location: PACU  Anesthesia Type:General  Level of Consciousness: drowsy  Airway & Oxygen Therapy: Patient Spontanous Breathing and Patient connected to face mask oxygen  Post-op Assessment: Report given to RN and Post -op Vital signs reviewed and stable  Post vital signs: Reviewed and stable  Last Vitals:  Vitals Value Taken Time  BP 113/91 10/29/23 0830  Temp 36.2 C 10/29/23 0820  Pulse 92 10/29/23 0831  Resp 13 10/29/23 0831  SpO2 96 % 10/29/23 0831  Vitals shown include unfiled device data.  Last Pain:  Vitals:   10/29/23 0820  TempSrc:   PainSc: Asleep         Complications: No notable events documented.

## 2023-10-29 NOTE — Anesthesia Postprocedure Evaluation (Signed)
 Anesthesia Post Note  Patient: [name] [name]  Procedure(s) Performed: ARTHROSCOPY, KNEE, WITH MEDIAL MENISCECTOMY (Left: Knee)     Patient location during evaluation: PACU Anesthesia Type: General Level of consciousness: awake and alert and oriented Pain management: pain level controlled Vital Signs Assessment: post-procedure vital signs reviewed and stable Respiratory status: spontaneous breathing, nonlabored ventilation and respiratory function stable Cardiovascular status: blood pressure returned to baseline and stable Postop Assessment: no apparent nausea or vomiting Anesthetic complications: no   No notable events documented.  Last Vitals:  Vitals:   10/29/23 0830 10/29/23 0845  BP: (!) 113/91 129/77  Pulse: 93 87  Resp: 12 14  Temp:    SpO2: 96% 93%    Last Pain:  Vitals:   10/29/23 0845  TempSrc:   PainSc: 0-No pain                 Marla Pouliot A.

## 2023-10-29 NOTE — Discharge Instructions (Addendum)
 Post Anesthesia Home Care Instructions  Activity: Get plenty of rest for the remainder of the day. A responsible individual must stay with you for 24 hours following the procedure.  For the next 24 hours, DO NOT: -Drive a car -Advertising copywriter -Drink alcoholic beverages -Take any medication unless instructed by your physician -Make any legal decisions or sign important papers.  Meals: Start with liquid foods such as gelatin or soup. Progress to regular foods as tolerated. Avoid greasy, spicy, heavy foods. If nausea and/or vomiting occur, drink only clear liquids until the nausea and/or vomiting subsides. Call your physician if vomiting continues.  Special Instructions/Symptoms: Your throat may feel dry or sore from the anesthesia or the breathing tube placed in your throat during surgery. If this causes discomfort, gargle with warm salt water. The discomfort should disappear within 24 hours.  If you had a scopolamine patch placed behind your ear for the management of post- operative nausea and/or vomiting:  1. The medication in the patch is effective for 72 hours, after which it should be removed.  Wrap patch in a tissue and discard in the trash. Wash hands thoroughly with soap and water. 2. You may remove the patch earlier than 72 hours if you experience unpleasant side effects which may include dry mouth, dizziness or visual disturbances. 3. Avoid touching the patch. Wash your hands with soap and water after contact with the patch.     Next dose of Tylenol may be given at 12:45pm if needed.  Diet: As you were doing prior to hospitalization   Shower/Dressing:  May shower but keep the wounds dry, use an occlusive plastic wrap, NO SOAKING IN TUB.  If the bandage gets wet, change with a clean dry gauze.  You may change your dressing 3-5 days after surgery. There are sticky tapes (steri-strips) on your wounds and all the stitches are absorbable.  Leave the steri-strips in place when  changing your dressings, they will peel off with time, usually 2-3 weeks.  Activity:  Increase activity slowly as tolerated, but follow the weight bearing instructions below.  The rules on driving is that you can not be taking narcotics while you drive, and you must feel in control of the vehicle.    Weight Bearing:   weight bearing as tolerated on operative leg  To prevent constipation: you may use a stool softener such as -  Colace (over the counter) 100 mg by mouth twice a day  Drink plenty of fluids (prune juice may be helpful) and high fiber foods Miralax (over the counter) for constipation as needed.    Itching:  If you experience itching with your medications, try taking only a single pain pill, or even half a pain pill at a time.  You may take up to 10 pain pills per day, and you can also use benadryl over the counter for itching or also to help with sleep.   Precautions:  If you experience chest pain or shortness of breath - call 911 immediately for transfer to the hospital emergency department!!  If you develop a fever greater that 101 F, purulent drainage from wound, increased redness or drainage from wound, or calf pain -- Call the office at [phone]                                                Follow-  Up Appointment:  Please call for an appointment to be seen in 2 weeks [place] - [phone]

## 2023-10-30 ENCOUNTER — Encounter (HOSPITAL_BASED_OUTPATIENT_CLINIC_OR_DEPARTMENT_OTHER): Payer: Self-pay | Admitting: Orthopedic Surgery

## 2023-11-20 LAB — LAB REPORT - SCANNED
Albumin, Urine POC: 2.43
Creatinine, POC: 190 mg/dL
EGFR: 46
Microalb Creat Ratio: 12.8

## 2023-12-04 ENCOUNTER — Telehealth: Payer: Self-pay | Admitting: *Deleted

## 2023-12-04 ENCOUNTER — Other Ambulatory Visit: Payer: Self-pay | Admitting: Cardiology

## 2023-12-04 DIAGNOSIS — I10 Essential (primary) hypertension: Secondary | ICD-10-CM

## 2023-12-04 DIAGNOSIS — R030 Elevated blood-pressure reading, without diagnosis of hypertension: Secondary | ICD-10-CM

## 2023-12-04 NOTE — Telephone Encounter (Signed)
 Contacted patient to schedule his 24 hour ambulatory blood pressure monitor.  He is currently scheduled to have it applied 12/10/23 and return it 12/12/23. Patient revealed his blood pressure was elevated at several medical appointments.  His primary care physician added Amlodipine , after his blood pressure at his office visit was around 158/90.  Our records indicate the patient was already on Amlodipine . I recommend the patient consider using the [facility] pharmacy, so all his medications would be on record.  I also recommend he review his medication list with the nurse when the 24 hour ambulatory blood pressure monitor results were called.

## 2023-12-10 ENCOUNTER — Ambulatory Visit: Attending: Cardiology

## 2023-12-10 DIAGNOSIS — R030 Elevated blood-pressure reading, without diagnosis of hypertension: Secondary | ICD-10-CM

## 2023-12-10 NOTE — Progress Notes (Unsigned)
 24 hour ambulatory blood pressure monitor applied to patients left arm using standard adult cuff.

## 2023-12-13 ENCOUNTER — Ambulatory Visit: Payer: Self-pay | Admitting: Cardiology

## 2023-12-13 DIAGNOSIS — R030 Elevated blood-pressure reading, without diagnosis of hypertension: Secondary | ICD-10-CM | POA: Diagnosis not present

## 2023-12-26 ENCOUNTER — Ambulatory Visit: Payer: Self-pay | Admitting: Cardiology

## 2023-12-26 DIAGNOSIS — E785 Hyperlipidemia, unspecified: Secondary | ICD-10-CM

## 2023-12-26 DIAGNOSIS — Z79899 Other long term (current) drug therapy: Secondary | ICD-10-CM

## 2023-12-27 NOTE — Telephone Encounter (Signed)
 MC message to patient to advise Labs from PCP showed an LDL of 2. Advised patient to stop pravastatin  and repeat FLP and ALT in 6 weeks, labs placed.

## 2023-12-27 NOTE — Telephone Encounter (Signed)
-----   Message from [name] [name] sent at 12/26/2023  2:47 PM EDT ----- Labs from PCP showed an LDL of 2.  Please have patient's stop pravastatin  and repeat FLP and ALT in 6 weeks ----- Message ----- From: [name] [name] Sent: 12/25/2023   3:01 PM EDT To: [name] Matsu, MD

## 2024-03-02 ENCOUNTER — Other Ambulatory Visit: Payer: Self-pay | Admitting: Cardiology

## 2024-03-02 DIAGNOSIS — I2510 Atherosclerotic heart disease of native coronary artery without angina pectoris: Secondary | ICD-10-CM

## 2024-03-02 DIAGNOSIS — Z951 Presence of aortocoronary bypass graft: Secondary | ICD-10-CM

## 2024-03-02 DIAGNOSIS — E785 Hyperlipidemia, unspecified: Secondary | ICD-10-CM

## 2024-04-08 ENCOUNTER — Other Ambulatory Visit: Payer: Self-pay

## 2024-04-08 DIAGNOSIS — E785 Hyperlipidemia, unspecified: Secondary | ICD-10-CM

## 2024-04-08 DIAGNOSIS — I2510 Atherosclerotic heart disease of native coronary artery without angina pectoris: Secondary | ICD-10-CM

## 2024-04-08 DIAGNOSIS — Z79899 Other long term (current) drug therapy: Secondary | ICD-10-CM

## 2024-04-09 ENCOUNTER — Ambulatory Visit: Payer: Self-pay | Admitting: Cardiology

## 2024-04-09 LAB — LIPID PANEL
Chol/HDL Ratio: 2 ratio (ref 0.0–5.0)
Cholesterol, Total: 94 mg/dL — ABNORMAL LOW (ref 100–199)
HDL: 46 mg/dL (ref >39)
LDL Chol Calc (NIH): 25 mg/dL (ref 0–99)
Triglycerides: 129 mg/dL (ref 0–149)
VLDL Cholesterol Cal: 23 mg/dL (ref 5–40)

## 2024-04-09 LAB — ALT: ALT: 31 IU/L (ref 0–44)

## 2024-04-28 NOTE — Progress Notes (Signed)
 Cardiology Office Note:  .   Date:  05/12/2024  ID:  [name] [name], DOB 18-Sep-1945, MRN [id number] PCP: Charlott Dorn LABOR, MD  [facility] Providers Cardiologist:  Wilbert Bihari, MD    History of Present Illness: .   [name] [name] is a 78 y.o. male   with a hx of ASCAD.  He initially presented with chest pain and underwent myoview which was neg for ischemia but Chest pain continued. Cardiac CT showed coronary calcium  score 1951 and severe > 70 % stenosis in prox LAD. along with possible significant disease in the ostial RCA and LCX.  It was felt that he could have balanced ischemia on nuc.   Cath showed severe CAD and underwent CABG by Dr. Dusty 11/04/15 with LIMA to distal LAD, SVG to 1st OM of LCx, seq VG to 2nd OM of LCX.  He had normal LV systolic function.    LDL was 2 12/2023 and was advised to stop pravastatin . Continued repatha  and f/u LDL 25 03/2024. 24 hr BP monitor showed BP not controlled.He was put back on metoprolol  that pharmacy hadn't filled. BP running up again. Getting extra salt in his diet-canned soup, fast food-biscuit. Denies chest pain, dyspnea. He's had occasional brief fluttering that doesn't last long. No regular exercise due to chronic back pain    ROS:    Studies Reviewed: SABRA    EKG Interpretation Date/Time:  Tuesday May 12 2024 10:56:38 EDT Ventricular Rate:  86 PR Interval:  148 QRS Duration:  134 QT Interval:  400 QTC Calculation: 478 R Axis:   108  Text Interpretation: Normal sinus rhythm Left ventricular hypertrophy with QRS widening and repolarization abnormality ( Cornell product ) When compared with ECG of 18-Jun-2023 12:44, Left bundle branch block Confirmed by Parthenia Klinefelter [phone]) on 05/12/2024 11:09:08 AM    Prior CV Studies:   24 hr BP monitor 11/2023   Overall average blood pressure 147/86 mmHg   Average awake blood pressure 149/87 mmHg   Average asleep blood pressure 139/80 mmHg   76 % of systolic BPs were > 140 mmHg awake and >  120 mmHg asleep   33 % of diastolic blood pressure readings were greater than 90 mm awake and greater than 80 mm sleep    Risk Assessment/Calculations:     HYPERTENSION CONTROL Vitals:   05/12/24 1058 05/12/24 1113  BP: (!) 154/108 (!) 150/90    The patient's blood pressure is elevated above target today.  In order to address the patient's elevated BP: Blood pressure will be monitored at home to determine if medication changes need to be made.; A current anti-hypertensive medication was adjusted today.; Follow up with primary care provider for management.; Follow up with general cardiology has been recommended.          Physical Exam:   VS:  BP (!) 150/90   Pulse 78   Ht 5' 6 (1.676 m)   Wt 219 lb 11.2 oz (99.7 kg)   SpO2 98%   BMI 35.46 kg/m    Orhtostatics: No data found. Wt Readings from Last 3 Encounters:  05/12/24 219 lb 11.2 oz (99.7 kg)  10/29/23 219 lb 12.8 oz (99.7 kg)  06/18/23 218 lb (98.9 kg)    GEN: Obese, in no acute distress NECK: No JVD; No carotid bruits CARDIAC:  RRR, no murmurs, rubs, gallops RESPIRATORY:  Clear to auscultation without rales, wheezing or rhonchi  ABDOMEN: Soft, non-tender, non-distended EXTREMITIES:  No edema; No deformity  ASSESSMENT AND PLAN: .    CAD S/P CAGB 2017,  No exertional symptoms on ASA, amlodipine , metoprolol , micardis    HTN BP uncontrolled-eating out more, canned soups etc -2 gm sodium diet discussed. Increase telmisartan  40 mg daily, bmet in 2 weeks. Send BP readings in after 2 weeks of measuring.   HLD managed by lipid clinic LDL 25 03/2024 after pravastatin  stopped. Continues on repatha .   Obestiy-can't exercise but weight loss encourage.             Dispo: f/u Dr. Shlomo 4 months.  Signed, Olivia Pavy, PA-C

## 2024-05-12 ENCOUNTER — Encounter: Payer: Self-pay | Admitting: Physician Assistant

## 2024-05-12 ENCOUNTER — Ambulatory Visit: Attending: Physician Assistant | Admitting: Physician Assistant

## 2024-05-12 VITALS — BP 150/90 | HR 78 | Ht 66.0 in | Wt 219.7 lb

## 2024-05-12 DIAGNOSIS — I10 Essential (primary) hypertension: Secondary | ICD-10-CM

## 2024-05-12 DIAGNOSIS — Z6835 Body mass index (BMI) 35.0-35.9, adult: Secondary | ICD-10-CM

## 2024-05-12 DIAGNOSIS — E785 Hyperlipidemia, unspecified: Secondary | ICD-10-CM | POA: Diagnosis not present

## 2024-05-12 DIAGNOSIS — Z79899 Other long term (current) drug therapy: Secondary | ICD-10-CM

## 2024-05-12 DIAGNOSIS — E66812 Obesity, class 2: Secondary | ICD-10-CM

## 2024-05-12 DIAGNOSIS — I2510 Atherosclerotic heart disease of native coronary artery without angina pectoris: Secondary | ICD-10-CM | POA: Diagnosis not present

## 2024-05-12 MED ORDER — TELMISARTAN 40 MG PO TABS: 40.0000 mg | ORAL_TABLET | Freq: Every day | ORAL | 1 refills | Status: AC

## 2024-05-12 NOTE — Patient Instructions (Signed)
 Medication Instructions:  Your physician has recommended you make the following change in your medication:   ** Stop Telmisartan  20mg   ** Begin Telmisartan  40mg  - 1 tablet by mouth daily  *If you need a refill on your cardiac medications before your next appointment, please call your pharmacy*  Lab Work: BMET in 2 weeks If you have labs (blood work) drawn today and your tests are completely normal, you will receive your results only by: MyChart Message (if you have MyChart) OR A paper copy in the mail If you have any lab test that is abnormal or we need to change your treatment, we will call you to review the results.  Testing/Procedures: None ordered.   Follow-Up: At [facility], you and your health needs are our priority.  As part of our continuing mission to provide you with exceptional heart care, our providers are all part of one team.  This team includes your primary Cardiologist (physician) and Advanced Practice Providers or APPs (Physician Assistants and Nurse Practitioners) who all work together to provide you with the care you need, when you need it.  Your next appointment:   4 months with Dr Shlomo  We recommend signing up for the patient portal called MyChart.  Sign up information is provided on this After Visit Summary.  MyChart is used to connect with patients for Virtual Visits (Telemedicine).  Patients are able to view lab/test results, encounter notes, upcoming appointments, etc.  Non-urgent messages can be sent to your provider as well.   To learn more about what you can do with MyChart, go to [email or web address].   Other Instructions  Check blood pressures daily and send BP log to us  through MyChart in 7-10 days   Two Gram Sodium Diet 2000 mg  What is Sodium? Sodium is a mineral found naturally in many foods. The most significant source of sodium in the diet is table salt, which is about 40% sodium.  Processed, convenience, and preserved foods  also contain a large amount of sodium.  The body needs only 500 mg of sodium daily to function,  A normal diet provides more than enough sodium even if you do not use salt.  Why Limit Sodium? A build up of sodium in the body can cause thirst, increased blood pressure, shortness of breath, and water  retention.  Decreasing sodium in the diet can reduce edema and risk of heart attack or stroke associated with high blood pressure.  Keep in mind that there are many other factors involved in these health problems.  Heredity, obesity, lack of exercise, cigarette smoking, stress and what you eat all play a role.  General Guidelines: Do not add salt at the table or in cooking.  One teaspoon of salt contains over 2 grams of sodium. Read food labels Avoid processed and convenience foods Ask your dietitian before eating any foods not dicussed in the menu planning guidelines Consult your physician if you wish to use a salt substitute or a sodium containing medication such as antacids.  Limit milk and milk products to 16 oz (2 cups) per day.  Shopping Hints: READ LABELS!! Dietetic does not necessarily mean low sodium. Salt and other sodium ingredients are often added to foods during processing.    Menu Planning Guidelines Food Group Choose More Often Avoid  Beverages (see also the milk group All fruit juices, low-sodium, salt-free vegetables juices, low-sodium carbonated beverages Regular vegetable or tomato juices, commercially softened water  used for drinking or cooking  Breads and Cereals  Enriched white, wheat, rye and pumpernickel bread, hard rolls and dinner rolls; muffins, cornbread and waffles; most dry cereals, cooked cereal without added salt; unsalted crackers and breadsticks; low sodium or homemade bread crumbs Bread, rolls and crackers with salted tops; quick breads; instant hot cereals; pancakes; commercial bread stuffing; self-rising flower and biscuit mixes; regular bread crumbs or cracker  crumbs  Desserts and Sweets Desserts and sweets mad with mild should be within allowance Instant pudding mixes and cake mixes  Fats Butter or margarine; vegetable oils; unsalted salad dressings, regular salad dressings limited to 1 Tbs; light, sour and heavy cream Regular salad dressings containing bacon fat, bacon bits, and salt pork; snack dips made with instant soup mixes or processed cheese; salted nuts  Fruits Most fresh, frozen and canned fruits Fruits processed with salt or sodium-containing ingredient (some dried fruits are processed with sodium sulfites        Vegetables Fresh, frozen vegetables and low- sodium canned vegetables Regular canned vegetables, sauerkraut, pickled vegetables, and others prepared in brine; frozen vegetables in sauces; vegetables seasoned with ham, bacon or salt pork  Condiments, Sauces, Miscellaneous  Salt substitute with physician's approval; pepper, herbs, spices; vinegar, lemon or lime juice; hot pepper sauce; garlic powder, onion powder, low sodium soy sauce (1 Tbs.); low sodium condiments (ketchup, chili sauce, mustard) in limited amounts (1 tsp.) fresh ground horseradish; unsalted tortilla chips, pretzels, potato chips, popcorn, salsa (1/4 cup) Any seasoning made with salt including garlic salt, celery salt, onion salt, and seasoned salt; sea salt, rock salt, kosher salt; meat tenderizers; monosodium glutamate; mustard, regular soy sauce, barbecue, sauce, chili sauce, teriyaki sauce, steak sauce, Worcestershire sauce, and most flavored vinegars; canned gravy and mixes; regular condiments; salted snack foods, olives, picles, relish, horseradish sauce, catsup   Food preparation: Try these seasonings Meats:    Pork Sage, onion Serve with applesauce  Chicken Poultry seasoning, thyme, parsley Serve with cranberry sauce  Lamb Curry powder, rosemary, garlic, thyme Serve with mint sauce or jelly  Veal Marjoram, basil Serve with current jelly, cranberry sauce   Beef Pepper, bay leaf Serve with dry mustard, unsalted chive butter  Fish Bay leaf, dill Serve with unsalted lemon butter, unsalted parsley butter  Vegetables:    Asparagus Lemon juice   Broccoli Lemon juice   Carrots Mustard dressing parsley, mint, nutmeg, glazed with unsalted butter and sugar   Green beans Marjoram, lemon juice, nutmeg,dill seed   Tomatoes Basil, marjoram, onion   Spice /blend for [employer] 4 tsp ground thyme 1 tsp ground sage 3 tsp ground rosemary 4 tsp ground marjoram   Test your knowledge A product that says Salt Free may still contain sodium. True or False Garlic Powder and Hot Pepper Sauce an be used as alternative seasonings.True or False Processed foods have more sodium than fresh foods.  True or False Canned Vegetables have less sodium than froze True or False   WAYS TO DECREASE YOUR SODIUM INTAKE Avoid the use of added salt in cooking and at the table.  Table salt (and other prepared seasonings which contain salt) is probably one of the greatest sources of sodium in the diet.  Unsalted foods can gain flavor from the sweet, sour, and butter taste sensations of herbs and spices.  Instead of using salt for seasoning, try the following seasonings with the foods listed.  Remember: how you use them to enhance natural food flavors is limited only by your creativity... Allspice-Meat, fish, eggs, fruit, peas, red and yellow vegetables Almond  Extract-Fruit baked goods Anise Seed-Sweet breads, fruit, carrots, beets, cottage cheese, cookies (tastes like licorice) Basil-Meat, fish, eggs, vegetables, rice, vegetables salads, soups, sauces Bay Leaf-Meat, fish, stews, poultry Burnet-Salad, vegetables (cucumber-like flavor) Caraway Seed-Bread, cookies, cottage cheese, meat, vegetables, cheese, rice Cardamon-Baked goods, fruit, soups Celery Powder or seed-Salads, salad dressings, sauces, meatloaf, soup, bread.Do not use  celery salt Chervil-Meats, salads, fish, eggs,  vegetables, cottage cheese (parsley-like flavor) Chili Power-Meatloaf, chicken cheese, corn, eggplant, egg dishes Chives-Salads cottage cheese, egg dishes, soups, vegetables, sauces Cilantro-Salsa, casseroles Cinnamon-Baked goods, fruit, pork, lamb, chicken, carrots Cloves-Fruit, baked goods, fish, pot roast, green beans, beets, carrots Coriander-Pastry, cookies, meat, salads, cheese (lemon-orange flavor) Cumin-Meatloaf, fish,cheese, eggs, cabbage,fruit pie (caraway flavor) [employer], fruit, eggs, fish, poultry, cottage cheese, vegetables Dill Seed-Meat, cottage cheese, poultry, vegetables, fish, salads, bread Fennel Seed-Bread, cookies, apples, pork, eggs, fish, beets, cabbage, cheese, Licorice-like flavor Garlic-(buds or powder) Salads, meat, poultry, fish, bread, butter, vegetables, potatoes.Do not  use garlic salt Ginger-Fruit, vegetables, baked goods, meat, fish, poultry Horseradish Root-Meet, vegetables, butter Lemon Juice or Extract-Vegetables, fruit, tea, baked goods, fish salads Mace-Baked goods fruit, vegetables, fish, poultry (taste like nutmeg) Maple Extract-Syrups Marjoram-Meat, chicken, fish, vegetables, breads, green salads (taste like Sage) Mint-Tea, lamb, sherbet, vegetables, desserts, carrots, cabbage Mustard, Dry or Seed-Cheese, eggs, meats, vegetables, poultry Nutmeg-Baked goods, fruit, chicken, eggs, vegetables, desserts Onion Powder-Meat, fish, poultry, vegetables, cheese, eggs, bread, rice salads (Do not use   Onion salt) Orange Extract-Desserts, baked goods Oregano-Pasta, eggs, cheese, onions, pork, lamb, fish, chicken, vegetables, green salads Paprika-Meat, fish, poultry, eggs, cheese, vegetables Parsley Flakes-Butter, vegetables, meat fish, poultry, eggs, bread, salads (certain forms may   Contain sodium Pepper-Meat fish, poultry, vegetables, eggs Peppermint Extract-Desserts, baked goods Poppy Seed-Eggs, bread, cheese, fruit dressings, baked goods,  noodles, vegetables, cottage  [employer], poultry, meat, fish, cauliflower, turnips,eggs bread Saffron-Rice, bread, veal, chicken, fish, eggs Sage-Meat, fish, poultry, onions, eggplant, tomateos, pork, stews Savory-Eggs, salads, poultry, meat, rice, vegetables, soups, pork Tarragon-Meat, poultry, fish, eggs, butter, vegetables (licorice-like flavor)  Thyme-Meat, poultry, fish, eggs, vegetables, (clover-like flavor), sauces, soups Tumeric-Salads, butter, eggs, fish, rice, vegetables (saffron-like flavor) Vanilla Extract-Baked goods, candy Vinegar-Salads, vegetables, meat marinades Walnut Extract-baked goods, candy   2. Choose your Foods Wisely   The following is a list of foods to avoid which are high in sodium:  Meats-Avoid all smoked, canned, salt cured, dried and kosher meat and fish as well as Anchovies   [employer] meats:Bologna, Liverwurst, Pastrami Canned meat or fish  Marinated herring Caviar    Pepperoni Corned Beef   Pizza Dried chipped beef  Salami Frozen breaded fish or meat Salt pork Frankfurters or hot dogs  Sardines Gefilte fish   Sausage Ham (boiled ham, Proscuitto Smoked butt    spiced ham)   Spam      TV Dinners Vegetables Canned vegetables (Regular) Relish Canned mushrooms  Sauerkraut Olives    Tomato juice Pickles  Bakery and Dessert Products Canned puddings  Cream pies Cheesecake   Decorated cakes Cookies  Beverages/Juices Tomato juice, regular  Gatorade   V-8 vegetable juice, regular  Breads and Cereals Biscuit mixes   Salted potato chips, corn chips, pretzels Bread stuffing mixes  Salted crackers and rolls Pancake and waffle mixes Self-rising flour  Seasonings Accent    Meat sauces Barbecue sauce  Meat tenderizer Catsup    Monosodium glutamate (MSG) Celery salt   Onion salt Chili sauce   Prepared mustard Garlic salt   Salt, seasoned salt,  sea salt Gravy mixes   Soy  sauce Horseradish   Steak sauce Ketchup   Tartar sauce Lite salt    Teriyaki sauce Marinade mixes   Worcestershire sauce  Others Baking powder   Cocoa and cocoa mixes Baking soda   Commercial casserole mixes Candy-caramels, chocolate  Dehydrated soups    Bars, fudge,nougats  Instant rice and pasta mixes Canned broth or soup  Maraschino cherries Cheese, aged and processed cheese and cheese spreads  Learning Assessment Quiz  Indicated T (for True) or F (for False) for each of the following statements:  _____ Fresh fruits and vegetables and unprocessed grains are generally low in sodium _____ Water  may contain a considerable amount of sodium, depending on the source _____ You can always tell if a food is high in sodium by tasting it _____ Certain laxatives my be high in sodium and should be avoided unless prescribed   by a physician or pharmacist _____ Salt substitutes may be used freely by anyone on a sodium restricted diet _____ Sodium is present in table salt, food additives and as a natural component of   most foods _____ Table salt is approximately 90% sodium _____ Limiting sodium intake may help prevent excess fluid accumulation in the body _____ On a sodium-restricted diet, seasonings such as bouillon soy sauce, and    cooking wine should be used in place of table salt _____ On an ingredient list, a product which lists monosodium glutamate as the first   ingredient is an appropriate food to include on a low sodium diet  Circle the best answer(s) to the following statements (Hint: there may be more than one correct answer)  11. On a low-sodium diet, some acceptable snack items are:    A. Olives  F. Bean dip   K. Grapefruit juice    B. Salted Pretzels G. Commercial Popcorn   L. Canned peaches    C. Carrot Sticks  H. Bouillon   M. Unsalted nuts   D. French fries  I. Peanut butter crackers N. Salami   E. Sweet pickles J. Tomato Juice   O. Pizza  12.  Seasonings that may be  used freely on a reduced - sodium diet include   A. Lemon wedges F.Monosodium glutamate K. Celery seed    B.Soysauce   G. Pepper   L. Mustard powder   C. Sea salt  H. Cooking wine  M. Onion flakes   D. Vinegar  E. Prepared horseradish N. Salsa   E. Sage   J. Worcestershire sauce  O. Chutney

## 2024-05-13 ENCOUNTER — Ambulatory Visit: Admitting: Physician Assistant

## 2024-05-27 LAB — BASIC METABOLIC PANEL WITH GFR
BUN/Creatinine Ratio: 9 — ABNORMAL LOW (ref 10–24)
BUN: 16 mg/dL (ref 8–27)
CO2: 23 mmol/L (ref 20–29)
Calcium: 9.5 mg/dL (ref 8.6–10.2)
Chloride: 100 mmol/L (ref 96–106)
Creatinine, Ser: 1.87 mg/dL — ABNORMAL HIGH (ref 0.76–1.27)
Glucose: 99 mg/dL (ref 70–99)
Potassium: 4.6 mmol/L (ref 3.5–5.2)
Sodium: 136 mmol/L (ref 134–144)
eGFR: 37 mL/min/1.73 — ABNORMAL LOW (ref >59)

## 2024-05-27 MED ORDER — METOPROLOL TARTRATE 25 MG PO TABS: 25.0000 mg | ORAL_TABLET | Freq: Two times a day (BID) | ORAL | Status: DC

## 2024-05-28 ENCOUNTER — Ambulatory Visit: Payer: Self-pay | Admitting: Physician Assistant

## 2024-06-08 ENCOUNTER — Telehealth: Payer: Self-pay | Admitting: *Deleted

## 2024-06-08 NOTE — Telephone Encounter (Signed)
Left voice message for patient to call me back.

## 2024-06-09 ENCOUNTER — Telehealth: Payer: Self-pay | Admitting: *Deleted

## 2024-06-09 DIAGNOSIS — Z79899 Other long term (current) drug therapy: Secondary | ICD-10-CM

## 2024-06-09 MED ORDER — AMLODIPINE BESYLATE 10 MG PO TABS: 10.0000 mg | ORAL_TABLET | Freq: Every day | ORAL | 1 refills | Status: AC

## 2024-06-09 NOTE — Telephone Encounter (Signed)
 Patient returned my call and I gave him the results to the lab work that he had done recently. Lab order has been out in and I sent in a new prescription for the increased dose of Amlodipine . Patient understood all instructions as well as recommendations moving forward. He had no questions at this time.

## 2024-06-09 NOTE — Telephone Encounter (Signed)
Left voice message for patient to call me back.

## 2024-06-17 ENCOUNTER — Other Ambulatory Visit: Payer: Self-pay | Admitting: Cardiology

## 2024-06-17 DIAGNOSIS — K219 Gastro-esophageal reflux disease without esophagitis: Secondary | ICD-10-CM

## 2024-06-17 LAB — BASIC METABOLIC PANEL WITH GFR
BUN/Creatinine Ratio: 10 (ref 10–24)
BUN: 17 mg/dL (ref 8–27)
CO2: 23 mmol/L (ref 20–29)
Calcium: 9.9 mg/dL (ref 8.6–10.2)
Chloride: 101 mmol/L (ref 96–106)
Creatinine, Ser: 1.77 mg/dL — ABNORMAL HIGH (ref 0.76–1.27)
Glucose: 96 mg/dL (ref 70–99)
Potassium: 4.2 mmol/L (ref 3.5–5.2)
Sodium: 140 mmol/L (ref 134–144)
eGFR: 39 mL/min/1.73 — ABNORMAL LOW (ref >59)

## 2024-06-18 ENCOUNTER — Ambulatory Visit: Payer: Self-pay | Admitting: Physician Assistant

## 2024-06-29 ENCOUNTER — Other Ambulatory Visit: Payer: Self-pay | Admitting: Cardiology

## 2024-07-01 ENCOUNTER — Other Ambulatory Visit: Payer: Self-pay | Admitting: Cardiology

## 2024-08-07 ENCOUNTER — Other Ambulatory Visit: Payer: Self-pay | Admitting: Cardiology

## 2024-08-17 ENCOUNTER — Encounter: Payer: Self-pay | Admitting: Cardiology

## 2024-08-19 ENCOUNTER — Encounter: Payer: Self-pay | Admitting: Nurse Practitioner

## 2024-08-19 ENCOUNTER — Ambulatory Visit: Admitting: Nurse Practitioner

## 2024-08-19 VITALS — BP 112/70 | HR 97 | Ht 66.0 in | Wt 216.0 lb

## 2024-08-19 DIAGNOSIS — I10 Essential (primary) hypertension: Secondary | ICD-10-CM

## 2024-08-19 DIAGNOSIS — I447 Left bundle-branch block, unspecified: Secondary | ICD-10-CM | POA: Diagnosis not present

## 2024-08-19 DIAGNOSIS — R0609 Other forms of dyspnea: Secondary | ICD-10-CM | POA: Diagnosis not present

## 2024-08-19 DIAGNOSIS — I2510 Atherosclerotic heart disease of native coronary artery without angina pectoris: Secondary | ICD-10-CM | POA: Diagnosis not present

## 2024-08-19 DIAGNOSIS — E785 Hyperlipidemia, unspecified: Secondary | ICD-10-CM | POA: Diagnosis not present

## 2024-08-19 DIAGNOSIS — Z951 Presence of aortocoronary bypass graft: Secondary | ICD-10-CM

## 2024-08-19 MED ORDER — ICOSAPENT ETHYL 1 G PO CAPS: 2.0000 g | ORAL_CAPSULE | Freq: Two times a day (BID) | ORAL | 0 refills | Status: AC

## 2024-08-19 NOTE — Patient Instructions (Signed)
 Medication Instructions:  Your physician recommends that you continue on your current medications as directed. Please refer to the Current Medication list given to you today.  *If you need a refill on your cardiac medications before your next appointment, please call your pharmacy*  Lab Work: NONE ordered at this time of appointment   Testing/Procedures: Your physician has requested that you have an echocardiogram. Echocardiography is a painless test that uses sound waves to create images of your heart. It provides your doctor with information about the size and shape of your heart and how well your hearts chambers and valves are working. This procedure takes approximately one hour. There are no restrictions for this procedure. Please do NOT wear cologne, perfume, aftershave, or lotions (deodorant is allowed). Please arrive 15 minutes prior to your appointment time.  Please note: We ask at that you not bring children with you during ultrasound (echo/ vascular) testing. Due to room size and safety concerns, children are not allowed in the ultrasound rooms during exams. Our front office staff cannot provide observation of children in our lobby area while testing is being conducted. An adult accompanying a patient to their appointment will only be allowed in the ultrasound room at the discretion of the ultrasound technician under special circumstances. We apologize for any inconvenience.   Follow-Up: At [facility], you and your health needs are our priority.  As part of our continuing mission to provide you with exceptional heart care, our providers are all part of one team.  This team includes your primary Cardiologist (physician) and Advanced Practice Providers or APPs (Physician Assistants and Nurse Practitioners) who all work together to provide you with the care you need, when you need it.  Your next appointment:    Keep appointment   Provider:   Wilbert Bihari, MD    We  recommend signing up for the patient portal called MyChart.  Sign up information is provided on this After Visit Summary.  MyChart is used to connect with patients for Virtual Visits (Telemedicine).  Patients are able to view lab/test results, encounter notes, upcoming appointments, etc.  Non-urgent messages can be sent to your provider as well.   To learn more about what you can do with MyChart, go to [email or web address].

## 2024-09-15 ENCOUNTER — Ambulatory Visit: Admitting: Cardiology

## 2024-09-17 ENCOUNTER — Ambulatory Visit (HOSPITAL_COMMUNITY)
